# Patient Record
Sex: Female | Born: 1970
Health system: Southern US, Community
[De-identification: ages and names within clinical notes are randomized; demographics above are authoritative.]

## PROBLEM LIST (undated history)

## (undated) DIAGNOSIS — Z720 Tobacco use: Secondary | ICD-10-CM

## (undated) DIAGNOSIS — K648 Other hemorrhoids: Secondary | ICD-10-CM

## (undated) DIAGNOSIS — R42 Dizziness and giddiness: Secondary | ICD-10-CM

## (undated) DIAGNOSIS — D649 Anemia, unspecified: Secondary | ICD-10-CM

## (undated) DIAGNOSIS — F32A Depression, unspecified: Secondary | ICD-10-CM

## (undated) DIAGNOSIS — Z87898 Personal history of other specified conditions: Secondary | ICD-10-CM

## (undated) DIAGNOSIS — F429 Obsessive-compulsive disorder, unspecified: Secondary | ICD-10-CM

## (undated) DIAGNOSIS — K219 Gastro-esophageal reflux disease without esophagitis: Secondary | ICD-10-CM

## (undated) DIAGNOSIS — F419 Anxiety disorder, unspecified: Secondary | ICD-10-CM

## (undated) DIAGNOSIS — I10 Essential (primary) hypertension: Secondary | ICD-10-CM

## (undated) DIAGNOSIS — R809 Proteinuria, unspecified: Secondary | ICD-10-CM

## (undated) DIAGNOSIS — F329 Major depressive disorder, single episode, unspecified: Secondary | ICD-10-CM

## (undated) DIAGNOSIS — E669 Obesity, unspecified: Secondary | ICD-10-CM

## (undated) DIAGNOSIS — K259 Gastric ulcer, unspecified as acute or chronic, without hemorrhage or perforation: Secondary | ICD-10-CM

## (undated) DIAGNOSIS — G4733 Obstructive sleep apnea (adult) (pediatric): Secondary | ICD-10-CM

## (undated) DIAGNOSIS — M5412 Radiculopathy, cervical region: Secondary | ICD-10-CM

## (undated) DIAGNOSIS — M5090 Cervical disc disorder, unspecified, unspecified cervical region: Secondary | ICD-10-CM

## (undated) DIAGNOSIS — E559 Vitamin D deficiency, unspecified: Secondary | ICD-10-CM

## (undated) DIAGNOSIS — Z9989 Dependence on other enabling machines and devices: Secondary | ICD-10-CM

## (undated) DIAGNOSIS — G542 Cervical root disorders, not elsewhere classified: Secondary | ICD-10-CM

## (undated) DIAGNOSIS — N189 Chronic kidney disease, unspecified: Secondary | ICD-10-CM

## (undated) HISTORY — DX: Anxiety disorder, unspecified: F41.9

## (undated) HISTORY — DX: Proteinuria, unspecified: R80.9

## (undated) HISTORY — DX: Depression, unspecified: F32.A

## (undated) HISTORY — DX: Other hemorrhoids: K64.8

## (undated) HISTORY — DX: Dependence on other enabling machines and devices: Z99.89

## (undated) HISTORY — DX: Cervical disc disorder, unspecified, unspecified cervical region: M50.90

## (undated) HISTORY — DX: Gastric ulcer, unspecified as acute or chronic, without hemorrhage or perforation: K25.9

## (undated) HISTORY — DX: Obesity, unspecified: E66.9

## (undated) HISTORY — DX: Major depressive disorder, single episode, unspecified: F32.9

## (undated) HISTORY — PX: BRAIN SURGERY: SHX531

## (undated) HISTORY — PX: TUBAL LIGATION: SHX77

## (undated) HISTORY — DX: Essential (primary) hypertension: I10

## (undated) HISTORY — DX: Personal history of other specified conditions: Z87.898

## (undated) HISTORY — DX: Tobacco use: Z72.0

## (undated) HISTORY — PX: CHOLECYSTECTOMY: SHX55

## (undated) HISTORY — DX: Chronic kidney disease, unspecified: N18.9

## (undated) HISTORY — DX: Obstructive sleep apnea (adult) (pediatric): G47.33

## (undated) HISTORY — DX: Obsessive-compulsive disorder, unspecified: F42.9

## (undated) HISTORY — DX: Vitamin D deficiency, unspecified: E55.9

---

## 1998-05-15 ENCOUNTER — Other Ambulatory Visit: Admission: RE | Admit: 1998-05-15 | Discharge: 1998-05-15 | Payer: Self-pay | Admitting: Obstetrics and Gynecology

## 1998-08-19 ENCOUNTER — Inpatient Hospital Stay (HOSPITAL_COMMUNITY): Admission: AD | Admit: 1998-08-19 | Discharge: 1998-08-27 | Payer: Self-pay | Admitting: Obstetrics and Gynecology

## 1998-08-20 ENCOUNTER — Encounter: Payer: Self-pay | Admitting: Obstetrics and Gynecology

## 1998-08-21 ENCOUNTER — Encounter: Payer: Self-pay | Admitting: Obstetrics and Gynecology

## 1998-08-27 ENCOUNTER — Encounter (HOSPITAL_COMMUNITY): Admission: RE | Admit: 1998-08-27 | Discharge: 1998-11-25 | Payer: Self-pay | Admitting: Obstetrics and Gynecology

## 1998-10-12 ENCOUNTER — Other Ambulatory Visit: Admission: RE | Admit: 1998-10-12 | Discharge: 1998-10-12 | Payer: Self-pay | Admitting: Obstetrics and Gynecology

## 2000-06-28 ENCOUNTER — Inpatient Hospital Stay (HOSPITAL_COMMUNITY): Admission: AD | Admit: 2000-06-28 | Discharge: 2000-06-28 | Payer: Self-pay | Admitting: *Deleted

## 2000-06-28 ENCOUNTER — Encounter: Payer: Self-pay | Admitting: *Deleted

## 2000-09-14 ENCOUNTER — Ambulatory Visit (HOSPITAL_COMMUNITY): Admission: RE | Admit: 2000-09-14 | Discharge: 2000-09-14 | Payer: Self-pay | Admitting: *Deleted

## 2000-09-14 ENCOUNTER — Encounter: Payer: Self-pay | Admitting: *Deleted

## 2000-11-12 ENCOUNTER — Inpatient Hospital Stay (HOSPITAL_COMMUNITY): Admission: AD | Admit: 2000-11-12 | Discharge: 2000-11-17 | Payer: Self-pay | Admitting: *Deleted

## 2000-11-12 ENCOUNTER — Encounter (INDEPENDENT_AMBULATORY_CARE_PROVIDER_SITE_OTHER): Payer: Self-pay

## 2000-11-18 ENCOUNTER — Encounter: Admission: RE | Admit: 2000-11-18 | Discharge: 2000-12-18 | Payer: Self-pay | Admitting: *Deleted

## 2000-12-08 ENCOUNTER — Emergency Department (HOSPITAL_COMMUNITY): Admission: EM | Admit: 2000-12-08 | Discharge: 2000-12-08 | Payer: Self-pay | Admitting: Emergency Medicine

## 2000-12-16 ENCOUNTER — Encounter: Payer: Self-pay | Admitting: Cardiology

## 2000-12-16 ENCOUNTER — Ambulatory Visit (HOSPITAL_COMMUNITY): Admission: RE | Admit: 2000-12-16 | Discharge: 2000-12-16 | Payer: Self-pay | Admitting: Cardiology

## 2001-01-18 ENCOUNTER — Encounter: Admission: RE | Admit: 2001-01-18 | Discharge: 2001-02-17 | Payer: Self-pay | Admitting: *Deleted

## 2001-02-16 ENCOUNTER — Encounter: Payer: Self-pay | Admitting: Nephrology

## 2001-02-16 ENCOUNTER — Encounter: Admission: RE | Admit: 2001-02-16 | Discharge: 2001-02-16 | Payer: Self-pay | Admitting: Nephrology

## 2001-03-20 ENCOUNTER — Encounter: Admission: RE | Admit: 2001-03-20 | Discharge: 2001-04-19 | Payer: Self-pay | Admitting: *Deleted

## 2001-04-20 ENCOUNTER — Encounter: Admission: RE | Admit: 2001-04-20 | Discharge: 2001-05-20 | Payer: Self-pay | Admitting: *Deleted

## 2002-01-03 ENCOUNTER — Encounter: Payer: Self-pay | Admitting: *Deleted

## 2002-01-03 ENCOUNTER — Encounter: Admission: RE | Admit: 2002-01-03 | Discharge: 2002-01-03 | Payer: Self-pay | Admitting: *Deleted

## 2002-03-09 ENCOUNTER — Emergency Department (HOSPITAL_COMMUNITY): Admission: EM | Admit: 2002-03-09 | Discharge: 2002-03-09 | Payer: Self-pay

## 2002-03-11 ENCOUNTER — Encounter: Admission: RE | Admit: 2002-03-11 | Discharge: 2002-03-11 | Payer: Self-pay | Admitting: *Deleted

## 2002-03-11 ENCOUNTER — Encounter: Payer: Self-pay | Admitting: *Deleted

## 2003-11-26 LAB — HM MAMMOGRAPHY

## 2003-11-28 ENCOUNTER — Encounter: Admission: RE | Admit: 2003-11-28 | Discharge: 2003-11-28 | Payer: Self-pay | Admitting: Family Medicine

## 2003-11-30 ENCOUNTER — Encounter: Admission: RE | Admit: 2003-11-30 | Discharge: 2003-11-30 | Payer: Self-pay | Admitting: Unknown Physician Specialty

## 2003-12-01 ENCOUNTER — Encounter: Admission: RE | Admit: 2003-12-01 | Discharge: 2003-12-01 | Payer: Self-pay | Admitting: Family Medicine

## 2003-12-01 ENCOUNTER — Encounter (INDEPENDENT_AMBULATORY_CARE_PROVIDER_SITE_OTHER): Payer: Self-pay | Admitting: *Deleted

## 2004-10-22 ENCOUNTER — Emergency Department (HOSPITAL_COMMUNITY): Admission: EM | Admit: 2004-10-22 | Discharge: 2004-10-22 | Payer: Self-pay | Admitting: Emergency Medicine

## 2004-12-31 ENCOUNTER — Other Ambulatory Visit: Admission: RE | Admit: 2004-12-31 | Discharge: 2004-12-31 | Payer: Self-pay | Admitting: Obstetrics and Gynecology

## 2005-03-11 ENCOUNTER — Ambulatory Visit (HOSPITAL_COMMUNITY): Admission: RE | Admit: 2005-03-11 | Discharge: 2005-03-11 | Payer: Self-pay

## 2006-02-08 ENCOUNTER — Emergency Department (HOSPITAL_COMMUNITY): Admission: EM | Admit: 2006-02-08 | Discharge: 2006-02-08 | Payer: Self-pay | Admitting: Emergency Medicine

## 2007-10-01 ENCOUNTER — Encounter: Admission: RE | Admit: 2007-10-01 | Discharge: 2007-10-01 | Payer: Self-pay | Admitting: Obstetrics and Gynecology

## 2007-10-13 ENCOUNTER — Encounter: Admission: RE | Admit: 2007-10-13 | Discharge: 2007-10-13 | Payer: Self-pay | Admitting: Obstetrics and Gynecology

## 2007-10-13 ENCOUNTER — Encounter (INDEPENDENT_AMBULATORY_CARE_PROVIDER_SITE_OTHER): Payer: Self-pay | Admitting: Diagnostic Radiology

## 2008-03-28 ENCOUNTER — Encounter: Admission: RE | Admit: 2008-03-28 | Discharge: 2008-03-28 | Payer: Self-pay | Admitting: Obstetrics and Gynecology

## 2008-09-18 ENCOUNTER — Ambulatory Visit (HOSPITAL_COMMUNITY): Admission: RE | Admit: 2008-09-18 | Discharge: 2008-09-18 | Payer: Self-pay | Admitting: Family Medicine

## 2009-03-28 ENCOUNTER — Encounter: Admission: RE | Admit: 2009-03-28 | Discharge: 2009-03-28 | Payer: Self-pay | Admitting: Obstetrics and Gynecology

## 2010-08-18 ENCOUNTER — Encounter: Payer: Self-pay | Admitting: Obstetrics and Gynecology

## 2010-12-13 NOTE — Op Note (Signed)
[facility]  Patient:    Cheryl Scott, Cheryl Scott                        MRN: [id number] Proc. Date: 11/13/00 Adm. Date:  [id number] Attending:  Margreta Journey                           Operative Report  PREOPERATIVE DIAGNOSES:       1. Severe prenancy-induced hypertension at                                  26 weeks.                               2. Preeclampsia.  SURGEON:                      Frederico Hamman, M.D.  FIRST ASSISTANT:              Charles A. Jodi Mourning, M.D.  ANESTHESIA:                   Spinal.  DESCRIPTION OF PROCEDURE:     The patient was placed on the operating table in the supine position after a spinal was administered.  The abdomen was prepped and draped and the bladder emptied with a Foley catheter.  A midline subumbilical incision was made through the old scar and carried down to the fascia.  The fascia was cleanly incised for the length of the incision.  The rectus muscles were retracted laterally and the peritoneum incised for the length of the incision.  There was a scar in the uterus from a previous classical incision and a classical incision was made through the old scar. The membranes were ruptured.  The fluid was clear.  The patient was delivered of a female vertex weighing 701 g.  Apgars were 4 and 7.  Team was in attendance.  Cord pH was 7.31.  The placenta was posterior, removed manually and sent to pathology.  The uterine cavity was cleaned with dry laps.  The uterine incision was closed in two layers with continuous suture of #1 chromic.  Hemostasis was satisfactory.  The right tube was grasped in the mid portion with a Babcock clamp.  A 0 plain suture was placed on the mesosalpinx below the portion of the tube within the clamp.  This was tied and approximately 1 inch of tube transected.  The procedure was done in a similar fashion on the other side.  Lap and sponge counts were correct.  The abdomen was closed in  layers, the peritoneum with continuous suture of 0 chromic, the fascia with interrupted suture of 0 Dexon.  The skin was closed with skin clips.  Blood loss was 400 cc.  The patient tolerated the procedure well and was taken to the recovery room in good condition. DD:  11/13/00 TD:  11/14/00 Job: [id number]

## 2010-12-13 NOTE — H&P (Signed)
[facility]  Patient:    Cheryl, Scott                        MRN: [id number] Adm. Date:  [id number] Attending:  Margreta Journey                         History and Physical  HISTORY OF PRESENT ILLNESS:   The patient is a 40 year old gravida 5 para 1-2-2-3, [facility] July 23, with a history of PIH.  During this pregnancy she has been on Aldomet 500 mg p.o. q.12h. and she was seen on April 18 in the emergency room with a blood pressure of 180/112, 3+ protein.  The patient states that she has been spilling protein all along in Dr. Bjorn Loser office. Her hemoglobin was 11.3, platelets 135, uric acid 5.2.  No contractions.  On admission she was started on magnesium sulfate 4 g loading 2 g an hour, also given betamethasone 12.5 mg IM to be repeated in 12 hours.  She had had two previous C sections - one classical and one low transverse for previous delivery at 25 weeks when she had severe DIC.  After admission her diastolics all remained between 109 and 119 and she was started on Norvasc 10 mg p.o. Blood pressure came down to the region of 104 and then she was given Apresoline 10 mg IV and throughout the night her diastolics ranged between 93 and 107.  The repeat uric acid done April 19 was 5.2, the alk phos was up to 160, SGOT 102, SGPT 52 - these were all elevated from the time of admission on April 18.  She started complaining of mid epigastric pain and then started having some vomiting.  It was decided that the patient would be delivered by a repeat C section and also she desired sterilization.  PHYSICAL EXAMINATION:  GENERAL:                      Revealed a well-developed female in no distress.  HEENT:                        Negative.  LUNGS:                        Clear.  HEART:                        Regular rhythm, no murmurs, no gallops.  ABDOMEN:                      Uterus 24-26 week size.  On palpation there was some tenderness in the epigastric  region.  There was no hepatomegaly noted.  PELVIC:                       Cervix was long, closed.  Pelvic exam was negative. DD:  11/14/06 TD:  11/13/00 Job: [id number]

## 2010-12-13 NOTE — Discharge Summary (Signed)
[facility]  Patient:    Cheryl Scott, Cheryl Scott                        MRN: [id number] Adm. Date:  [id number] Disc. Date: [id number] Attending:  Lacretia Leigh                           Discharge Summary  SUMMARY:                      The patient is a 40 year old gravida 34, para 3 whose estimated date of confinement was July 23 with a history of pregnancy induced hypertension.  During this pregnancy the patient has been on Aldomet 500 mg p.o. q.12h.  She was seen on April 18 in the emergency room with a blood pressure of 180/112 with 3+ proteinuria.  At the time of admission this patient was seen by Dr. Ruthann Cancer who was covering for me.  She was treated with magnesium sulfate; however, due to patient not being stabilized and also because she wanted sterilization, the decision was to proceed with a cesarean section.  Patient underwent a cesarean section on November 13, 2000 from which she tolerated procedure very well without any problems.  Postoperative the patient did very well even though she had a low platelet count of 42,000 which gradually increased.  She was discharged on November 17, 2000 and was told to return to my office in one week for followup evaluation.  Patient understood the possibility of complications and how to care and should anything occur was to call me in my office. DD:  01/02/01 TD:  01/03/01 Job: [id number]

## 2011-02-10 ENCOUNTER — Encounter: Payer: Self-pay | Admitting: Family Medicine

## 2011-02-10 DIAGNOSIS — F429 Obsessive-compulsive disorder, unspecified: Secondary | ICD-10-CM | POA: Insufficient documentation

## 2011-02-10 DIAGNOSIS — R809 Proteinuria, unspecified: Secondary | ICD-10-CM | POA: Insufficient documentation

## 2011-02-10 DIAGNOSIS — I10 Essential (primary) hypertension: Secondary | ICD-10-CM | POA: Insufficient documentation

## 2011-02-10 DIAGNOSIS — F419 Anxiety disorder, unspecified: Secondary | ICD-10-CM | POA: Insufficient documentation

## 2012-03-26 ENCOUNTER — Other Ambulatory Visit: Payer: Self-pay | Admitting: Physician Assistant

## 2012-03-26 ENCOUNTER — Ambulatory Visit
Admission: RE | Admit: 2012-03-26 | Discharge: 2012-03-26 | Disposition: A | Discharge status: Home / Self Care | Payer: 59 | Source: Ambulatory Visit | Attending: Physician Assistant | Admitting: Physician Assistant

## 2012-03-26 DIAGNOSIS — M62838 Other muscle spasm: Secondary | ICD-10-CM

## 2012-03-26 DIAGNOSIS — M79602 Pain in left arm: Secondary | ICD-10-CM

## 2012-03-26 DIAGNOSIS — M542 Cervicalgia: Secondary | ICD-10-CM

## 2012-10-15 ENCOUNTER — Encounter: Payer: Self-pay | Admitting: Family Medicine

## 2012-10-15 DIAGNOSIS — Z9989 Dependence on other enabling machines and devices: Secondary | ICD-10-CM | POA: Insufficient documentation

## 2012-10-15 DIAGNOSIS — G4733 Obstructive sleep apnea (adult) (pediatric): Secondary | ICD-10-CM | POA: Insufficient documentation

## 2012-10-18 ENCOUNTER — Ambulatory Visit (INDEPENDENT_AMBULATORY_CARE_PROVIDER_SITE_OTHER): Payer: 59 | Admitting: Physician Assistant

## 2012-10-18 ENCOUNTER — Encounter: Payer: Self-pay | Admitting: Physician Assistant

## 2012-10-18 VITALS — BP 118/80 | HR 56 | Temp 98.5°F | Resp 18 | Wt 271.0 lb

## 2012-10-18 DIAGNOSIS — H8110 Benign paroxysmal vertigo, unspecified ear: Secondary | ICD-10-CM

## 2012-10-18 DIAGNOSIS — J322 Chronic ethmoidal sinusitis: Secondary | ICD-10-CM

## 2012-10-18 MED ORDER — MECLIZINE HCL 25 MG PO TABS: 25.0000 mg | ORAL_TABLET | Freq: Two times a day (BID) | ORAL | Status: DC | PRN

## 2012-10-18 MED ORDER — AZITHROMYCIN 250 MG PO TABS: ORAL_TABLET | ORAL | Status: DC

## 2012-10-18 MED ORDER — FLUCONAZOLE 150 MG PO TABS: 150.0000 mg | ORAL_TABLET | Freq: Once | ORAL | Status: DC

## 2012-10-18 MED ORDER — PREDNISONE 20 MG PO TABS: ORAL_TABLET | ORAL | Status: DC

## 2012-10-18 NOTE — Progress Notes (Signed)
Patient ID: Cheryl Scott MRN: [id number], DOB: 06-16-1971, 42 y.o. Date of Encounter: 10/18/2012, 8:57 AM    Chief Complaint:  Chief Complaint  Patient presents with  . Dizziness    5-6 days  RX was called in but still does not feel right     HPI: 42 y.o. year old female called when I was on call 10/09/12. At that time she stated she had remote h/o vertigo. Reported that when she woke that morning, had vertigo. As long as she was lying still, felt ok but when rolled to side or turned head to the side, she had spinning sensation. I called in meclizine. This worked well. Pts symptoms have gotten much better. The past 2 days only needed one meclizine each day.   She came in today because she feels like there is congestion in her head. When blows nose, gets out thick mucus. Also, feels pressure in head. When bends over, lays on one side, etc. Feels pressure, movement in head. No chest congestion, ST, F/C.      Home Meds: Current Outpatient Prescriptions on File Prior to Visit  Medication Sig Dispense Refill  . ARIPiprazole (ABILIFY) 30 MG tablet Take 50 mg by mouth daily.        . Cholecalciferol (VITAMIN D) 2000 UNITS CAPS Take by mouth daily.      . clonazePAM (KLONOPIN) 0.5 MG tablet Take 0.5 mg by mouth 2 (two) times daily as needed.        . eszopiclone (LUNESTA) 2 MG TABS Take 2 mg by mouth at bedtime. Take immediately before bedtime       . lisdexamfetamine (VYVANSE) 50 MG capsule Take 50 mg by mouth every morning.        Marland Kitchen lisinopril (PRINIVIL,ZESTRIL) 10 MG tablet Take 10 mg by mouth daily.        Marland Kitchen omeprazole (PRILOSEC) 20 MG capsule Take 20 mg by mouth daily.        . sertraline (ZOLOFT) 50 MG tablet Take 50 mg by mouth daily.        . divalproex (DEPAKOTE SPRINKLE) 125 MG capsule Take 125 mg by mouth daily.        Marland Kitchen lamoTRIgine (LAMICTAL) 100 MG tablet Take 100 mg by mouth daily.         No current facility-administered medications on file prior to visit.    Allergies:   Allergies  Allergen Reactions  . Diuretic (Buchu-Cornsilk-Ch Grass-Hydran) Anaphylaxis  . Ace Inhibitors Cough  . Codeine   . Penicillins Hives and Itching  . Seroquel (Quetiapine)     Hypotension, alt mental status      Review of Systems: Constitutional: negative for chills, fever, night sweats, weight changes, or fatigue  HEENT: negative for vision changes, hearing loss, rhinorrhea, ST. See HPI for positives. Cardiovascular: negative for chest pain or palpitations Respiratory: negative for hemoptysis, wheezing, shortness of breath, or cough Abdominal: negative for abdominal pain, nausea, vomiting, diarrhea, or constipation Dermatological: negative for rash Neurologic: negative for headache, dizziness, or syncope    Physical Exam: Blood pressure 118/80, pulse 56, temperature 98.5 F (36.9 C), temperature source Oral, resp. rate 18, weight 271 lb (122.925 kg), last menstrual period 09/27/2012., There is no height on file to calculate BMI. General:     [name] in no acute distress. HEENT: Normocephalic, atraumatic, eyes without discharge, sclera non-icteric, nares are without discharge. Bilateral auditory canals clear, TM's are without perforation, pearly grey and translucent with reflective cone of light  bilaterally. Oral cavity moist, posterior pharynx without exudate, erythema, peritonsillar abscess, or post nasal drip. Frontal and maxillary sinuses with mild tenderness with percussion.  Neck: Supple. No thyromegaly. Full ROM. No lymphadenopathy. Lungs: Clear bilaterally to auscultation without wheezes, rales, or rhonchi. Breathing is unlabored. Heart: RRR with S1 S2. No murmurs, rubs, or gallops appreciated. Msk:  Strength and tone normal for age. [place] Maneuver: When I turn head side to side, she feels nauseas. When sits up from lying, feels "like on a boat" No spinning sensation at all-this has resolved per pt.  Extremities/Skin: Warm and dry. No clubbing or cyanosis.  No edema. No rashes or suspicious lesions. Neuro: Alert and oriented X 3. Moves all extremities spontaneously. Gait is normal. CNII-XII grossly in tact. Psych:  Responds to questions appropriately with a normal affect.    ASSESSMENT AND PLAN:  42 y.o. year old female with  1. Vertigo, benign positional  - meclizine (ANTIVERT) 25 MG tablet; Take 1 tablet (25 mg total) by mouth 2 (two) times daily as needed.  Dispense: 30 tablet; Refill: 0 She wants to have some of this on hand in case has recurrent symptoms. "I don't want to ever feel like that again".  2. Ethmoid sinusitis - predniSONE (DELTASONE) 20 MG tablet; Take 3 daily for 2 days, then 2 daily for 2 days, then 1 daily for 2 days.  Dispense: 12 tablet; Refill: 0 Penicillin Allergy so no Augmentin.: - azithromycin (ZITHROMAX) 250 MG tablet; Take 2 daily on day 1 then 1 daily on days 2-5.  Dispense: 6 tablet; Refill: 0 - fluconazole (DIFLUCAN) 150 MG tablet; Take 1 tablet (150 mg total) by mouth once.  Dispense: 1 tablet; Refill: 0  F/U if symptoms do not resolve.  [address] [place], [place], [facility] 10/18/2012 8:57 AM

## 2012-11-01 ENCOUNTER — Telehealth: Payer: Self-pay | Admitting: Physician Assistant

## 2012-11-01 DIAGNOSIS — I10 Essential (primary) hypertension: Secondary | ICD-10-CM

## 2012-11-01 MED ORDER — LISINOPRIL 10 MG PO TABS: 10.0000 mg | ORAL_TABLET | Freq: Every day | ORAL | Status: DC

## 2012-11-01 NOTE — Telephone Encounter (Signed)
Medication refilled per protocol. 

## 2012-11-05 ENCOUNTER — Other Ambulatory Visit: Payer: Self-pay | Admitting: Obstetrics and Gynecology

## 2012-11-11 ENCOUNTER — Other Ambulatory Visit: Payer: Self-pay | Admitting: Obstetrics and Gynecology

## 2012-11-11 DIAGNOSIS — R928 Other abnormal and inconclusive findings on diagnostic imaging of breast: Secondary | ICD-10-CM

## 2012-11-25 ENCOUNTER — Ambulatory Visit
Admission: RE | Admit: 2012-11-25 | Discharge: 2012-11-25 | Disposition: A | Discharge status: Home / Self Care | Payer: 59 | Source: Ambulatory Visit | Attending: Obstetrics and Gynecology | Admitting: Obstetrics and Gynecology

## 2012-11-25 DIAGNOSIS — R928 Other abnormal and inconclusive findings on diagnostic imaging of breast: Secondary | ICD-10-CM

## 2012-12-14 ENCOUNTER — Ambulatory Visit (INDEPENDENT_AMBULATORY_CARE_PROVIDER_SITE_OTHER): Payer: 59 | Admitting: Urology

## 2012-12-14 ENCOUNTER — Other Ambulatory Visit: Payer: Self-pay | Admitting: Urology

## 2012-12-14 DIAGNOSIS — R3129 Other microscopic hematuria: Secondary | ICD-10-CM

## 2012-12-16 ENCOUNTER — Telehealth: Payer: Self-pay | Admitting: Family Medicine

## 2012-12-16 DIAGNOSIS — H8110 Benign paroxysmal vertigo, unspecified ear: Secondary | ICD-10-CM

## 2012-12-16 MED ORDER — MECLIZINE HCL 25 MG PO TABS: 25.0000 mg | ORAL_TABLET | Freq: Two times a day (BID) | ORAL | Status: DC | PRN

## 2012-12-16 NOTE — Telephone Encounter (Signed)
Med rf °

## 2012-12-23 ENCOUNTER — Ambulatory Visit (HOSPITAL_COMMUNITY)
Admission: RE | Admit: 2012-12-23 | Discharge: 2012-12-23 | Disposition: A | Discharge status: Home / Self Care | Payer: 59 | Source: Ambulatory Visit | Attending: Urology | Admitting: Urology

## 2012-12-23 DIAGNOSIS — R319 Hematuria, unspecified: Secondary | ICD-10-CM | POA: Insufficient documentation

## 2012-12-23 DIAGNOSIS — R3129 Other microscopic hematuria: Secondary | ICD-10-CM

## 2012-12-23 DIAGNOSIS — Q619 Cystic kidney disease, unspecified: Secondary | ICD-10-CM | POA: Insufficient documentation

## 2012-12-23 MED ORDER — IOHEXOL 300 MG/ML  SOLN
150.0000 mL | Freq: Once | INTRAMUSCULAR | Status: AC | PRN
  Administered 2012-12-23: 150 mL via INTRAVENOUS

## 2012-12-24 ENCOUNTER — Telehealth: Payer: Self-pay | Admitting: Family Medicine

## 2012-12-24 MED ORDER — OMEPRAZOLE 20 MG PO CPDR: 20.0000 mg | DELAYED_RELEASE_CAPSULE | Freq: Every day | ORAL | Status: DC

## 2012-12-24 NOTE — Telephone Encounter (Signed)
Rx Refilled  

## 2013-01-04 ENCOUNTER — Ambulatory Visit (INDEPENDENT_AMBULATORY_CARE_PROVIDER_SITE_OTHER): Payer: 59 | Admitting: Urology

## 2013-01-04 DIAGNOSIS — R3129 Other microscopic hematuria: Secondary | ICD-10-CM

## 2013-01-04 DIAGNOSIS — N135 Crossing vessel and stricture of ureter without hydronephrosis: Secondary | ICD-10-CM

## 2013-01-16 ENCOUNTER — Other Ambulatory Visit: Payer: Self-pay | Admitting: Family Medicine

## 2013-01-17 ENCOUNTER — Telehealth: Payer: Self-pay | Admitting: Physician Assistant

## 2013-01-17 MED ORDER — OMEPRAZOLE 20 MG PO CPDR: 20.0000 mg | DELAYED_RELEASE_CAPSULE | Freq: Every day | ORAL | Status: DC

## 2013-01-17 NOTE — Telephone Encounter (Signed)
?   OK to Refill  

## 2013-01-17 NOTE — Telephone Encounter (Signed)
ok 

## 2013-01-17 NOTE — Telephone Encounter (Signed)
Medication refilled per protocol. 

## 2013-01-31 ENCOUNTER — Telehealth: Payer: Self-pay | Admitting: Family Medicine

## 2013-01-31 DIAGNOSIS — I10 Essential (primary) hypertension: Secondary | ICD-10-CM

## 2013-01-31 MED ORDER — LISINOPRIL 10 MG PO TABS: 10.0000 mg | ORAL_TABLET | Freq: Every day | ORAL | Status: DC

## 2013-01-31 NOTE — Telephone Encounter (Signed)
Rx Refilled  

## 2013-02-18 ENCOUNTER — Other Ambulatory Visit: Payer: Self-pay | Admitting: Family Medicine

## 2013-03-07 ENCOUNTER — Encounter: Payer: Self-pay | Admitting: Physician Assistant

## 2013-03-07 ENCOUNTER — Ambulatory Visit (INDEPENDENT_AMBULATORY_CARE_PROVIDER_SITE_OTHER): Payer: 59 | Admitting: Physician Assistant

## 2013-03-07 VITALS — BP 126/90 | HR 60 | Temp 98.0°F | Resp 18 | Wt 270.0 lb

## 2013-03-07 DIAGNOSIS — Z1212 Encounter for screening for malignant neoplasm of rectum: Secondary | ICD-10-CM

## 2013-03-07 DIAGNOSIS — IMO0001 Reserved for inherently not codable concepts without codable children: Secondary | ICD-10-CM

## 2013-03-07 DIAGNOSIS — Z1211 Encounter for screening for malignant neoplasm of colon: Secondary | ICD-10-CM

## 2013-03-07 LAB — COMPLETE METABOLIC PANEL WITH GFR
ALT: 23 U/L (ref 0–35)
AST: 20 U/L (ref 0–37)
Albumin: 4.1 g/dL (ref 3.5–5.2)
Alkaline Phosphatase: 87 U/L (ref 39–117)
BUN: 12 mg/dL (ref 6–23)
CO2: 28 mEq/L (ref 19–32)
Calcium: 9.5 mg/dL (ref 8.4–10.5)
Chloride: 103 mEq/L (ref 96–112)
Creat: 1.24 mg/dL — ABNORMAL HIGH (ref 0.50–1.10)
GFR, Est African American: 61 mL/min
GFR, Est Non African American: 53 mL/min — ABNORMAL LOW
Glucose, Bld: 107 mg/dL — ABNORMAL HIGH (ref 70–99)
Potassium: 4.1 mEq/L (ref 3.5–5.3)
Sodium: 140 mEq/L (ref 135–145)
Total Bilirubin: 0.4 mg/dL (ref 0.3–1.2)
Total Protein: 7.1 g/dL (ref 6.0–8.3)

## 2013-03-07 LAB — RHEUMATOID FACTOR: Rhuematoid fact SerPl-aCnc: <10 IU/mL (ref <=14)

## 2013-03-07 LAB — CBC WITH DIFFERENTIAL/PLATELET
Basophils Absolute: 0 10*3/uL (ref 0.0–0.1)
Basophils Relative: 0 % (ref 0–1)
Eosinophils Absolute: 0.2 10*3/uL (ref 0.0–0.7)
Eosinophils Relative: 3 % (ref 0–5)
HCT: 35.8 % — ABNORMAL LOW (ref 36.0–46.0)
Hemoglobin: 11.7 g/dL — ABNORMAL LOW (ref 12.0–15.0)
Lymphocytes Relative: 25 % (ref 12–46)
Lymphs Abs: 1.8 10*3/uL (ref 0.7–4.0)
MCH: 27 pg (ref 26.0–34.0)
MCHC: 32.7 g/dL (ref 30.0–36.0)
MCV: 82.5 fL (ref 78.0–100.0)
Monocytes Absolute: 0.3 10*3/uL (ref 0.1–1.0)
Monocytes Relative: 4 % (ref 3–12)
Neutro Abs: 4.8 10*3/uL (ref 1.7–7.7)
Neutrophils Relative %: 68 % (ref 43–77)
Platelets: 273 10*3/uL (ref 150–400)
RBC: 4.34 MIL/uL (ref 3.87–5.11)
RDW: 14.5 % (ref 11.5–15.5)
WBC: 7 10*3/uL (ref 4.0–10.5)

## 2013-03-07 LAB — SEDIMENTATION RATE: Sed Rate: 16 mm/hr (ref 0–22)

## 2013-03-07 LAB — TSH: TSH: 2.606 u[IU]/mL (ref 0.350–4.500)

## 2013-03-07 LAB — CK: Total CK: 135 U/L (ref 7–177)

## 2013-03-07 MED ORDER — DOXYCYCLINE HYCLATE 100 MG PO TABS: 100.0000 mg | ORAL_TABLET | Freq: Two times a day (BID) | ORAL | Status: DC
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
## 2013-03-08 LAB — B. BURGDORFI ANTIBODIES BY WB
B burgdorferi IgG Abs (IB): NEGATIVE
B burgdorferi IgM Abs (IB): NEGATIVE

## 2013-03-08 LAB — ANA: Anti Nuclear Antibody(ANA): NEGATIVE

## 2013-03-09 ENCOUNTER — Telehealth: Payer: Self-pay | Admitting: Family Medicine

## 2013-03-09 LAB — ROCKY MTN SPOTTED FVR ABS PNL(IGG+IGM)
RMSF IgG: 0.19 IV
RMSF IgM: 0.94 IV — ABNORMAL HIGH

## 2013-03-09 NOTE — Telephone Encounter (Signed)
Message copied by Olena Mater on Wed Mar 09, 2013  3:43 PM ------      Message from: Dena Billet      Created: Wed Mar 09, 2013  3:34 PM       RMSF Titer is Positive for current/recent infection. Complete all of Doxycycline.      All other labs (extensive list) negative. ------

## 2013-03-10 NOTE — Telephone Encounter (Signed)
Pt did call back and given lab results.  Reinforced importance to take all antibiotics given.

## 2013-03-18 ENCOUNTER — Other Ambulatory Visit: Payer: Self-pay | Admitting: Family Medicine

## 2013-03-21 ENCOUNTER — Other Ambulatory Visit: Payer: Self-pay | Admitting: Family Medicine

## 2013-03-21 ENCOUNTER — Telehealth: Payer: Self-pay | Admitting: Family Medicine

## 2013-03-21 DIAGNOSIS — J322 Chronic ethmoidal sinusitis: Secondary | ICD-10-CM

## 2013-03-21 MED ORDER — FLUCONAZOLE 150 MG PO TABS
150.0000 mg | ORAL_TABLET | Freq: Once | ORAL | Status: DC
Start: 2013-03-21 — End: 2013-05-10

## 2013-03-21 NOTE — Telephone Encounter (Signed)
I will e-scribe.

## 2013-03-21 NOTE — Telephone Encounter (Signed)
..  Patient aware per vm 

## 2013-03-21 NOTE — Telephone Encounter (Signed)
OK to call in

## 2013-04-14 ENCOUNTER — Telehealth: Payer: Self-pay | Admitting: Family Medicine

## 2013-04-14 MED ORDER — OMEPRAZOLE 20 MG PO CPDR: 20.0000 mg | DELAYED_RELEASE_CAPSULE | Freq: Two times a day (BID) | ORAL | Status: DC

## 2013-04-14 NOTE — Telephone Encounter (Signed)
ok 

## 2013-04-14 NOTE — Telephone Encounter (Signed)
Rx Refilled  

## 2013-04-14 NOTE — Telephone Encounter (Signed)
Omeprazole DR 20 mg cap (pt would like to start taking it 2 QD instead of 1 QD according to the pharmacy)

## 2013-04-21 ENCOUNTER — Telehealth: Payer: Self-pay

## 2013-04-21 NOTE — Telephone Encounter (Signed)
LMOM for a return call.  

## 2013-04-21 NOTE — Telephone Encounter (Signed)
Pt was referred for screening colonoscopy. ( She is age 42 now) FH of colon cancer in her sister at age 23. Needs OV.

## 2013-04-22 NOTE — Telephone Encounter (Signed)
Pt is scheduled an OV with LL on 05/10/2013 at 8:30 AM.

## 2013-05-03 ENCOUNTER — Other Ambulatory Visit: Payer: Self-pay | Admitting: Family Medicine

## 2013-05-03 NOTE — Telephone Encounter (Signed)
Medication refilled per protocol. 

## 2013-05-07 ENCOUNTER — Other Ambulatory Visit: Payer: Self-pay | Admitting: Physician Assistant

## 2013-05-09 ENCOUNTER — Telehealth: Payer: Self-pay | Admitting: Physician Assistant

## 2013-05-09 MED ORDER — LISINOPRIL 10 MG PO TABS: 10.0000 mg | ORAL_TABLET | Freq: Every day | ORAL | Status: DC

## 2013-05-09 NOTE — Telephone Encounter (Signed)
Patient needs her lisinopril refilled. Went to her pharmacy to get it refilled and they told her it was denied .

## 2013-05-09 NOTE — Telephone Encounter (Signed)
Medication refilled per protocol. 

## 2013-05-09 NOTE — Telephone Encounter (Signed)
Medication refilled per protocol.  No idea why denied??

## 2013-05-10 ENCOUNTER — Encounter (INDEPENDENT_AMBULATORY_CARE_PROVIDER_SITE_OTHER): Payer: Self-pay

## 2013-05-10 ENCOUNTER — Encounter: Payer: Self-pay | Admitting: Gastroenterology

## 2013-05-10 ENCOUNTER — Ambulatory Visit (INDEPENDENT_AMBULATORY_CARE_PROVIDER_SITE_OTHER): Payer: 59 | Admitting: Gastroenterology

## 2013-05-10 ENCOUNTER — Other Ambulatory Visit: Payer: Self-pay | Admitting: Gastroenterology

## 2013-05-10 VITALS — BP 108/69 | HR 47 | Temp 97.6°F | Ht 67.0 in | Wt 267.2 lb

## 2013-05-10 DIAGNOSIS — Z79899 Other long term (current) drug therapy: Secondary | ICD-10-CM

## 2013-05-10 DIAGNOSIS — Z800 Family history of malignant neoplasm of digestive organs: Secondary | ICD-10-CM | POA: Insufficient documentation

## 2013-05-10 DIAGNOSIS — K219 Gastro-esophageal reflux disease without esophagitis: Secondary | ICD-10-CM

## 2013-05-10 DIAGNOSIS — K625 Hemorrhage of anus and rectum: Secondary | ICD-10-CM

## 2013-05-10 MED ORDER — PEG 3350-KCL-NA BICARB-NACL 420 G PO SOLR: 4000.0000 mL | ORAL | Status: DC

## 2013-05-10 NOTE — Assessment & Plan Note (Signed)
42 y/o female who presents for first ever colonoscopy. She had sister who succumbed to colon cancer, diagnosed at age 20. Patient has chronic pp loose stools. Recent brbpr. Given polypharmacy, would recommend deep sedation in the OR.  I have discussed the risks, alternatives, benefits with regards to but not limited to the risk of reaction to medication, bleeding, infection, perforation and the patient is agreeable to proceed. Written consent to be obtained.

## 2013-05-10 NOTE — Patient Instructions (Signed)
1. We have scheduled you for a colonoscopy and upper endoscopy with Dr. Oneida Alar given your family history of colon cancer and your personal history of chronic acid reflux disease.

## 2013-05-10 NOTE — Progress Notes (Signed)
cc'd to pcp 

## 2013-05-10 NOTE — Assessment & Plan Note (Signed)
Chronic GERD on PPI for 7+ years. Generally symptoms well-controlled. Recommend one time EGD to screen for complicated GERD ie Barrett's.  I have discussed the risks, alternatives, benefits with regards to but not limited to the risk of reaction to medication, bleeding, infection, perforation and the patient is agreeable to proceed. Written consent to be obtained.
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
## 2013-05-11 ENCOUNTER — Encounter (HOSPITAL_COMMUNITY): Payer: Self-pay | Admitting: Pharmacy Technician

## 2013-05-17 ENCOUNTER — Other Ambulatory Visit (INDEPENDENT_AMBULATORY_CARE_PROVIDER_SITE_OTHER): Payer: 59

## 2013-05-17 DIAGNOSIS — F429 Obsessive-compulsive disorder, unspecified: Secondary | ICD-10-CM

## 2013-05-17 DIAGNOSIS — Z23 Encounter for immunization: Secondary | ICD-10-CM

## 2013-05-17 DIAGNOSIS — I10 Essential (primary) hypertension: Secondary | ICD-10-CM

## 2013-05-17 DIAGNOSIS — Z79899 Other long term (current) drug therapy: Secondary | ICD-10-CM

## 2013-05-17 LAB — CBC WITH DIFFERENTIAL/PLATELET
Basophils Absolute: 0 10*3/uL (ref 0.0–0.1)
Basophils Relative: 0 % (ref 0–1)
Eosinophils Absolute: 0.1 10*3/uL (ref 0.0–0.7)
Eosinophils Relative: 2 % (ref 0–5)
HCT: 36.3 % (ref 36.0–46.0)
Hemoglobin: 12.1 g/dL (ref 12.0–15.0)
Lymphocytes Relative: 19 % (ref 12–46)
Lymphs Abs: 1.4 10*3/uL (ref 0.7–4.0)
MCH: 27.2 pg (ref 26.0–34.0)
MCHC: 33.3 g/dL (ref 30.0–36.0)
MCV: 81.6 fL (ref 78.0–100.0)
Monocytes Absolute: 0.3 10*3/uL (ref 0.1–1.0)
Monocytes Relative: 4 % (ref 3–12)
Neutro Abs: 5.6 10*3/uL (ref 1.7–7.7)
Neutrophils Relative %: 75 % (ref 43–77)
Platelets: 269 10*3/uL (ref 150–400)
RBC: 4.45 MIL/uL (ref 3.87–5.11)
RDW: 15.5 % (ref 11.5–15.5)
WBC: 7.4 10*3/uL (ref 4.0–10.5)

## 2013-05-17 LAB — COMPLETE METABOLIC PANEL WITH GFR
ALT: 20 U/L (ref 0–35)
AST: 17 U/L (ref 0–37)
Albumin: 4.1 g/dL (ref 3.5–5.2)
Alkaline Phosphatase: 78 U/L (ref 39–117)
BUN: 12 mg/dL (ref 6–23)
CO2: 23 mEq/L (ref 19–32)
Calcium: 9.2 mg/dL (ref 8.4–10.5)
Chloride: 102 mEq/L (ref 96–112)
Creat: 1.34 mg/dL — ABNORMAL HIGH (ref 0.50–1.10)
GFR, Est African American: 56 mL/min — ABNORMAL LOW
GFR, Est Non African American: 48 mL/min — ABNORMAL LOW
Glucose, Bld: 139 mg/dL — ABNORMAL HIGH (ref 70–99)
Potassium: 4.2 mEq/L (ref 3.5–5.3)
Sodium: 138 mEq/L (ref 135–145)
Total Bilirubin: 0.3 mg/dL (ref 0.3–1.2)
Total Protein: 7.2 g/dL (ref 6.0–8.3)

## 2013-05-17 LAB — LIPID PANEL
Cholesterol: 151 mg/dL (ref 0–200)
HDL: 45 mg/dL (ref >39)
LDL Cholesterol: 66 mg/dL (ref 0–99)
Total CHOL/HDL Ratio: 3.4 Ratio
Triglycerides: 200 mg/dL — ABNORMAL HIGH (ref <150)
VLDL: 40 mg/dL (ref 0–40)

## 2013-05-17 LAB — TSH: TSH: 3.53 u[IU]/mL (ref 0.350–4.500)

## 2013-05-17 NOTE — Progress Notes (Unsigned)
Pt having labs for Pending Colonoscopy and for med check for Mental Health Provider.  Also need routine visit here and she is aware and will make appt.

## 2013-05-18 LAB — VITAMIN D 25 HYDROXY (VIT D DEFICIENCY, FRACTURES): Vit D, 25-Hydroxy: 64 ng/mL (ref 30–89)

## 2013-05-19 ENCOUNTER — Other Ambulatory Visit: Payer: Self-pay

## 2013-05-19 ENCOUNTER — Encounter (HOSPITAL_COMMUNITY)
Admission: RE | Admit: 2013-05-19 | Discharge: 2013-05-19 | Disposition: A | Discharge status: Home / Self Care | Payer: 59 | Source: Ambulatory Visit | Attending: Gastroenterology | Admitting: Gastroenterology

## 2013-05-19 ENCOUNTER — Encounter (HOSPITAL_COMMUNITY): Payer: Self-pay

## 2013-05-19 DIAGNOSIS — Z01818 Encounter for other preprocedural examination: Secondary | ICD-10-CM | POA: Insufficient documentation

## 2013-05-19 HISTORY — DX: Dizziness and giddiness: R42

## 2013-05-19 HISTORY — DX: Gastro-esophageal reflux disease without esophagitis: K21.9

## 2013-05-19 NOTE — Patient Instructions (Signed)
Cheryl Scott  05/19/2013   Your procedure is scheduled on:  05/24/2013 Report to [facility] at  61 AM.  Call this number if you have problems the morning of surgery: [phone]   Remember:   Do not eat food or drink liquids after midnight.   Take these medicines the morning of surgery with A SIP OF WATER: abilify, klonopin, lisinopril, antivert, prilosec, zoloft   Do not wear jewelry, make-up or nail polish.  Do not wear lotions, powders, or perfumes.   Do not shave 48 hours prior to surgery. Men may shave face and neck.  Do not bring valuables to the hospital.  [facility] is not responsible  for any belongings or valuables.               Contacts, dentures or bridgework may not be worn into surgery.  Leave suitcase in the car. After surgery it may be brought to your room.  For patients admitted to the hospital, discharge time is determined by your treatment team.               Patients discharged the day of surgery will not be allowed to drive home.  Name and phone number of your driver: family  Special Instructions: N/A   Please read over the following fact sheets that you were given: Pain Booklet, Coughing and Deep Breathing, Surgical Site Infection Prevention, Anesthesia Post-op Instructions and Care and Recovery After Surgery Colonoscopy A colonoscopy is an exam to evaluate your entire colon. In this exam, your colon is cleansed. A long fiberoptic tube is inserted through your rectum and into your colon. The fiberoptic scope (endoscope) is a long bundle of enclosed and very flexible fibers. These fibers transmit light to the area examined and send images from that area to your caregiver. Discomfort is usually minimal. You may be given a drug to help you sleep (sedative) during or prior to the procedure. This exam helps to detect lumps (tumors), polyps, inflammation, and areas of bleeding. Your caregiver may also take a small piece of tissue (biopsy) that will be  examined under a microscope. LET YOUR CAREGIVER KNOW ABOUT:   Allergies to food or medicine.  Medicines taken, including vitamins, herbs, eyedrops, over-the-counter medicines, and creams.  Use of steroids (by mouth or creams).  Previous problems with anesthetics or numbing medicines.  History of bleeding problems or blood clots.  Previous surgery.  Other health problems, including diabetes and kidney problems.  Possibility of pregnancy, if this applies. BEFORE THE PROCEDURE   A clear liquid diet may be required for 2 days before the exam.  Ask your caregiver about changing or stopping your regular medications.  Liquid injections (enemas) or laxatives may be required.  A large amount of electrolyte solution may be given to you to drink over a short period of time. This solution is used to clean out your colon.  You should be present 60 minutes prior to your procedure or as directed by your caregiver. AFTER THE PROCEDURE   If you received a sedative or pain relieving medication, you will need to arrange for someone to drive you home.  Occasionally, there is a little blood passed with the first bowel movement. Do not be concerned. FINDING OUT THE RESULTS OF YOUR TEST Not all test results are available during your visit. If your test results are not back during the visit, make an appointment with your caregiver to find out the results. Do  not assume everything is normal if you have not heard from your caregiver or the medical facility. It is important for you to follow up on all of your test results. HOME CARE INSTRUCTIONS   It is not unusual to pass moderate amounts of gas and experience mild abdominal cramping following the procedure. This is due to air being used to inflate your colon during the exam. Walking or a warm pack on your belly (abdomen) may help.  You may resume all normal meals and activities after sedatives and medicines have worn off.  Only take over-the-counter  or prescription medicines for pain, discomfort, or fever as directed by your caregiver. Do not use aspirin or blood thinners if a biopsy was taken. Consult your caregiver for medicine usage if biopsies were taken. SEEK IMMEDIATE MEDICAL CARE IF:   You have a fever.  You pass large blood clots or fill a toilet with blood following the procedure. This may also occur 10 to 14 days following the procedure. This is more likely if a biopsy was taken.  You develop abdominal pain that keeps getting worse and cannot be relieved with medicine. Document Released: 07/11/2000 Document Revised: 10/06/2011 Document Reviewed: 02/24/2008 [facility] Patient Information 2014 [place]. Esophagogastroduodenoscopy Esophagogastroduodenoscopy (EGD) is a procedure to examine the lining of the esophagus, stomach, and first part of the small intestine (duodenum). A long, flexible, lighted tube with a camera attached (endoscope) is inserted down the throat to view these organs. This procedure is done to detect problems or abnormalities, such as inflammation, bleeding, ulcers, or growths, in order to treat them. The procedure lasts about 5 20 minutes. It is usually an outpatient procedure, but it may need to be performed in emergency cases in the hospital. LET YOUR CAREGIVER KNOW ABOUT:   Allergies to food or medicine.  All medicines you are taking, including vitamins, herbs, eyedrops, and over-the-counter medicines and creams.  Use of steroids (by mouth or creams).  Previous problems you or members of your family have had with the use of anesthetics.  Any blood disorders you have.  Previous surgeries you have had.  Other health problems you have.  Possibility of pregnancy, if this applies. RISKS AND COMPLICATIONS  Generally, EGD is a safe procedure. However, as with any procedure, complications can occur. Possible complications include:  Infection.  Bleeding.  Tearing (perforation) of the esophagus,  stomach, or duodenum.  Difficulty breathing or not being able to breath.  Excessive sweating.  Spasms of the larynx.  Slowed heartbeat.  Low blood pressure. BEFORE THE PROCEDURE  Do not eat or drink anything for 6 8 hours before the procedure or as directed by your caregiver.  Ask your caregiver about changing or stopping your regular medicines.  If you wear dentures, be prepared to remove them before the procedure.  Arrange for someone to drive you home after the procedure. PROCEDURE   A vein will be accessed to give medicines and fluids. A medicine to relax you (sedative) and a pain reliever will be given through that access into the vein.  A numbing medicine (local anesthetic) may be sprayed on your throat for comfort and to stop you from gagging or coughing.  A mouth guard may be placed in your mouth to protect your teeth and to keep you from biting on the endoscope.  You will be asked to lie on your left side.  The endoscope is inserted down your throat and into the esophagus, stomach, and duodenum.  Air is put through  the endoscope to allow your caregiver to view the lining of your esophagus clearly.  The esophagus, stomach, and duodenum is then examined. During the exam, your caregiver may:  Remove tissue to be examined under a microscope (biopsy) for inflammation, infection, or other medical problems.  Remove growths.  Remove objects (foreign bodies) that are stuck.  Treat any bleeding with medicines or other devices that stop tissues from bleeding (hot cauters, clipping devices).  Widen (dilate) or stretch narrowed areas of the esophagus and stomach.  The endoscope will then be withdrawn. AFTER THE PROCEDURE  You will be taken to a recovery area to be monitored. You will be able to go home once you are stable and alert.  Do not eat or drink anything until the local anesthetic and numbing medicines have worn off. You may choke.  It is normal to feel  bloated, have pain with swallowing, or have a sore throat for a short time. This will wear off.  Your caregiver should be able to discuss his or her findings with you. It will take longer to discuss the test results if any biopsies were taken. Document Released: 11/14/2004 Document Revised: 06/30/2012 Document Reviewed: 06/16/2012 [facility] Patient Information 2014 [place], [place]. PATIENT INSTRUCTIONS POST-ANESTHESIA  IMMEDIATELY FOLLOWING SURGERY:  Do not drive or operate machinery for the first twenty four hours after surgery.  Do not make any important decisions for twenty four hours after surgery or while taking narcotic pain medications or sedatives.  If you develop intractable nausea and vomiting or a severe headache please notify your doctor immediately.  FOLLOW-UP:  Please make an appointment with your surgeon as instructed. You do not need to follow up with anesthesia unless specifically instructed to do so.  WOUND CARE INSTRUCTIONS (if applicable):  Keep a dry clean dressing on the anesthesia/puncture wound site if there is drainage.  Once the wound has quit draining you may leave it open to air.  Generally you should leave the bandage intact for twenty four hours unless there is drainage.  If the epidural site drains for more than 36-48 hours please call the anesthesia department.  QUESTIONS?:  Please feel free to call your physician or the hospital operator if you have any questions, and they will be happy to assist you.

## 2013-05-24 ENCOUNTER — Ambulatory Visit (HOSPITAL_COMMUNITY)
Admission: RE | Admit: 2013-05-24 | Discharge: 2013-05-24 | Disposition: A | Discharge status: Home / Self Care | Payer: 59 | Source: Ambulatory Visit | Attending: Gastroenterology | Admitting: Gastroenterology

## 2013-05-24 ENCOUNTER — Encounter (HOSPITAL_COMMUNITY): Payer: Self-pay | Admitting: Anesthesiology

## 2013-05-24 ENCOUNTER — Telehealth: Payer: Self-pay

## 2013-05-24 ENCOUNTER — Encounter (HOSPITAL_COMMUNITY)
Admission: RE | Disposition: A | Discharge status: Home / Self Care | Payer: Self-pay | Source: Ambulatory Visit | Attending: Gastroenterology

## 2013-05-24 ENCOUNTER — Encounter (HOSPITAL_COMMUNITY): Payer: 59 | Admitting: Anesthesiology

## 2013-05-24 ENCOUNTER — Ambulatory Visit (HOSPITAL_COMMUNITY): Payer: 59 | Admitting: Anesthesiology

## 2013-05-24 DIAGNOSIS — Z800 Family history of malignant neoplasm of digestive organs: Secondary | ICD-10-CM | POA: Insufficient documentation

## 2013-05-24 DIAGNOSIS — Z6841 Body Mass Index (BMI) 40.0 and over, adult: Secondary | ICD-10-CM | POA: Insufficient documentation

## 2013-05-24 DIAGNOSIS — K2940 Chronic atrophic gastritis without bleeding: Secondary | ICD-10-CM | POA: Insufficient documentation

## 2013-05-24 DIAGNOSIS — K3189 Other diseases of stomach and duodenum: Secondary | ICD-10-CM | POA: Insufficient documentation

## 2013-05-24 DIAGNOSIS — K648 Other hemorrhoids: Secondary | ICD-10-CM | POA: Insufficient documentation

## 2013-05-24 DIAGNOSIS — Z1211 Encounter for screening for malignant neoplasm of colon: Secondary | ICD-10-CM | POA: Insufficient documentation

## 2013-05-24 DIAGNOSIS — K222 Esophageal obstruction: Secondary | ICD-10-CM

## 2013-05-24 DIAGNOSIS — K259 Gastric ulcer, unspecified as acute or chronic, without hemorrhage or perforation: Secondary | ICD-10-CM

## 2013-05-24 DIAGNOSIS — I10 Essential (primary) hypertension: Secondary | ICD-10-CM | POA: Insufficient documentation

## 2013-05-24 DIAGNOSIS — K2980 Duodenitis without bleeding: Secondary | ICD-10-CM

## 2013-05-24 DIAGNOSIS — R1013 Epigastric pain: Secondary | ICD-10-CM

## 2013-05-24 HISTORY — PX: ESOPHAGOGASTRODUODENOSCOPY (EGD) WITH PROPOFOL: SHX5813

## 2013-05-24 HISTORY — PX: BIOPSY: SHX5522

## 2013-05-24 HISTORY — PX: COLONOSCOPY WITH PROPOFOL: SHX5780

## 2013-05-24 LAB — HM COLONOSCOPY

## 2013-05-24 SURGERY — COLONOSCOPY WITH PROPOFOL
Anesthesia: Monitor Anesthesia Care

## 2013-05-24 MED ORDER — LACTATED RINGERS IV SOLN
INTRAVENOUS | Status: DC | PRN
  Administered 2013-05-24 (×2): via INTRAVENOUS

## 2013-05-24 MED ORDER — STERILE WATER FOR IRRIGATION IR SOLN
Status: DC | PRN
  Administered 2013-05-24: 08:00:00

## 2013-05-24 MED ORDER — FENTANYL CITRATE 0.05 MG/ML IJ SOLN: 25.0000 ug | INTRAMUSCULAR | Status: DC | PRN

## 2013-05-24 MED ORDER — ONDANSETRON HCL 4 MG/2ML IJ SOLN
INTRAMUSCULAR | Status: AC
  Filled 2013-05-24: qty 2

## 2013-05-24 MED ORDER — GLYCOPYRROLATE 0.2 MG/ML IJ SOLN
INTRAMUSCULAR | Status: AC
  Filled 2013-05-24: qty 1

## 2013-05-24 MED ORDER — ONDANSETRON HCL 4 MG/2ML IJ SOLN: 4.0000 mg | Freq: Once | INTRAMUSCULAR | Status: DC | PRN

## 2013-05-24 MED ORDER — WATER FOR IRRIGATION, STERILE IR SOLN
Status: DC | PRN
  Administered 2013-05-24: 1000 mL

## 2013-05-24 MED ORDER — GLYCOPYRROLATE 0.2 MG/ML IJ SOLN
0.2000 mg | Freq: Once | INTRAMUSCULAR | Status: AC
  Administered 2013-05-24: 0.2 mg via INTRAVENOUS

## 2013-05-24 MED ORDER — ONDANSETRON HCL 4 MG/2ML IJ SOLN
4.0000 mg | Freq: Once | INTRAMUSCULAR | Status: AC
  Administered 2013-05-24: 4 mg via INTRAVENOUS

## 2013-05-24 MED ORDER — FENTANYL CITRATE 0.05 MG/ML IJ SOLN
INTRAMUSCULAR | Status: AC
  Filled 2013-05-24: qty 2

## 2013-05-24 MED ORDER — FENTANYL CITRATE 0.05 MG/ML IJ SOLN
25.0000 ug | INTRAMUSCULAR | Status: AC
Start: 2013-05-24 — End: 2013-05-24
  Administered 2013-05-24 (×2): 25 ug via INTRAVENOUS

## 2013-05-24 MED ORDER — MIDAZOLAM HCL 2 MG/2ML IJ SOLN
INTRAMUSCULAR | Status: AC
  Filled 2013-05-24: qty 2

## 2013-05-24 MED ORDER — PROPOFOL INFUSION 10 MG/ML OPTIME
INTRAVENOUS | Status: DC | PRN
  Administered 2013-05-24: 08:00:00 via INTRAVENOUS
  Administered 2013-05-24: 75 ug/kg/min via INTRAVENOUS

## 2013-05-24 MED ORDER — LIDOCAINE HCL (PF) 1 % IJ SOLN
INTRAMUSCULAR | Status: AC
  Filled 2013-05-24: qty 5

## 2013-05-24 MED ORDER — PROPOFOL 10 MG/ML IV BOLUS
INTRAVENOUS | Status: AC
  Filled 2013-05-24: qty 20

## 2013-05-24 MED ORDER — FENTANYL CITRATE 0.05 MG/ML IJ SOLN
INTRAMUSCULAR | Status: DC | PRN
  Administered 2013-05-24 (×2): 25 ug via INTRAVENOUS

## 2013-05-24 MED ORDER — LIDOCAINE HCL (CARDIAC) 10 MG/ML IV SOLN
INTRAVENOUS | Status: DC | PRN
  Administered 2013-05-24: 50 mg via INTRAVENOUS

## 2013-05-24 MED ORDER — MIDAZOLAM HCL 5 MG/5ML IJ SOLN
INTRAMUSCULAR | Status: DC | PRN
  Administered 2013-05-24 (×2): 1 mg via INTRAVENOUS

## 2013-05-24 MED ORDER — MIDAZOLAM HCL 2 MG/2ML IJ SOLN
1.0000 mg | INTRAMUSCULAR | Status: DC | PRN
  Administered 2013-05-24: 2 mg via INTRAVENOUS

## 2013-05-24 MED ORDER — LACTATED RINGERS IV SOLN: INTRAVENOUS | Status: DC

## 2013-05-24 SURGICAL SUPPLY — 24 items
BLOCK BITE 60FR ADLT L/F BLUE (MISCELLANEOUS) ×1 IMPLANT
ELECT REM PT RETURN 9FT ADLT (ELECTROSURGICAL)
ELECTRODE REM PT RTRN 9FT ADLT (ELECTROSURGICAL) IMPLANT
FCP BXJMBJMB 240X2.8X (CUTTING FORCEPS)
FLOOR PAD 36X40 (MISCELLANEOUS) ×2
FORCEPS BIOP RAD 4 LRG CAP 4 (CUTTING FORCEPS) IMPLANT
FORCEPS BIOP RJ4 240 W/NDL (CUTTING FORCEPS)
FORCEPS BXJMBJMB 240X2.8X (CUTTING FORCEPS) IMPLANT
FORMALIN 10 PREFIL 20ML (MISCELLANEOUS) ×1 IMPLANT
INJECTOR/SNARE I SNARE (MISCELLANEOUS) IMPLANT
LUBRICANT JELLY 4.5OZ STERILE (MISCELLANEOUS) ×1 IMPLANT
MANIFOLD NEPTUNE II (INSTRUMENTS) ×2 IMPLANT
NDL SCLEROTHERAPY 25GX240 (NEEDLE) IMPLANT
NEEDLE SCLEROTHERAPY 25GX240 (NEEDLE) ×2 IMPLANT
PAD FLOOR 36X40 (MISCELLANEOUS) ×1 IMPLANT
PROBE APC STR FIRE (PROBE) IMPLANT
PROBE INJECTION GOLD (MISCELLANEOUS)
PROBE INJECTION GOLD 7FR (MISCELLANEOUS) IMPLANT
SNARE SHORT THROW 13M SML OVAL (MISCELLANEOUS) ×2 IMPLANT
SYR 50ML LL SCALE MARK (SYRINGE) ×1 IMPLANT
TRAP SPECIMEN MUCOUS 40CC (MISCELLANEOUS) ×1 IMPLANT
TUBING ENDO SMARTCAP PENTAX (MISCELLANEOUS) ×1 IMPLANT
TUBING IRRIGATION ENDOGATOR (MISCELLANEOUS) ×1 IMPLANT
WATER STERILE IRR 1000ML POUR (IV SOLUTION) ×1 IMPLANT

## 2013-05-24 NOTE — Transfer of Care (Signed)
Immediate Anesthesia Transfer of Care Note  Patient: Cheryl Scott  Procedure(s) Performed: Procedure(s) with comments: COLONOSCOPY WITH PROPOFOL (N/A) - in cecum at 0809 out at 0818 = 9 minutes total ESOPHAGOGASTRODUODENOSCOPY (EGD) WITH PROPOFOL (N/A) BIOPSY (N/A)  Patient Location: PACU  Anesthesia Type:MAC  Level of Consciousness: sedated and patient cooperative  Airway & Oxygen Therapy: Patient Spontanous Breathing  Post-op Assessment: Report given to PACU RN and Post -op Vital signs reviewed and stable  Post vital signs: Reviewed  Complications: No apparent anesthesia complications

## 2013-05-24 NOTE — Preoperative (Signed)
Beta Blockers   Reason not to administer Beta Blockers:Not Applicable 

## 2013-05-24 NOTE — Progress Notes (Signed)
Awake. Wants to go home. Swallowing without difficulty. Wants to see husband. Encouraged to pass air. Voiced understanding.

## 2013-05-24 NOTE — Anesthesia Postprocedure Evaluation (Signed)
  Anesthesia Post-op Note  Patient: Cheryl Scott  Procedure(s) Performed: Procedure(s) with comments: COLONOSCOPY WITH PROPOFOL (N/A) - in cecum at 0809 out at 0818 = 9 minutes total ESOPHAGOGASTRODUODENOSCOPY (EGD) WITH PROPOFOL (N/A) BIOPSY (N/A)  Patient Location: PACU  Anesthesia Type:MAC  Level of Consciousness: sedated and patient cooperative  Airway and Oxygen Therapy: Patient Spontanous Breathing  Post-op Pain: none  Post-op Assessment: Post-op Vital signs reviewed, Patient's Cardiovascular Status Stable, Respiratory Function Stable, Patent Airway, No signs of Nausea or vomiting and Pain level controlled  Post-op Vital Signs: Reviewed and stable  Complications: No apparent anesthesia complications

## 2013-05-24 NOTE — Telephone Encounter (Signed)
Olivia Mackie from Day Surgery called and said pt needs e30 appt with Dr. Oneida Alar to follow up TCS/EGD in 3 months. She is aware I do not have that schedule now and forwarding to [facility].

## 2013-05-24 NOTE — Anesthesia Preprocedure Evaluation (Addendum)
Anesthesia Evaluation  Patient identified by MRN, date of birth, ID band Patient awake    Reviewed: Allergy & Precautions, H&P , NPO status , Patient's Chart, lab work & pertinent test results  Airway Mallampati: II TM Distance: >3 FB Neck ROM: Full    Dental  (+) Edentulous Upper   Pulmonary sleep apnea and Continuous Positive Airway Pressure Ventilation ,  breath sounds clear to auscultation        Cardiovascular hypertension, Pt. on medications Rhythm:Regular Rate:Normal     Neuro/Psych PSYCHIATRIC DISORDERS Anxiety Depression    GI/Hepatic   Endo/Other    Renal/GU      Musculoskeletal   Abdominal   Peds  Hematology   Anesthesia Other Findings   Reproductive/Obstetrics                           Anesthesia Physical Anesthesia Plan  ASA: III  Anesthesia Plan: MAC   Post-op Pain Management:    Induction: Intravenous  Airway Management Planned: Simple Face Mask  Additional Equipment:   Intra-op Plan:   Post-operative Plan:   Informed Consent: I have reviewed the patients History and Physical, chart, labs and discussed the procedure including the risks, benefits and alternatives for the proposed anesthesia with the patient or authorized representative who has indicated his/her understanding and acceptance.     Plan Discussed with:   Anesthesia Plan Comments:         Anesthesia Quick Evaluation

## 2013-05-24 NOTE — Progress Notes (Signed)
Arrived to preop. C/O stuffy and running nose. C/O clear and yellow nasal drainage. Frequent non-productive cough. Denies body aches and fever.

## 2013-05-24 NOTE — Discharge Instructions (Addendum)
YOUR ABDOMINAL PAIN IS DUE TO EROSIVE GASTRITIS & ulcers. YYou have internal hemorrhoids & DIVERTICULOSIS IN YOUR LEFT COLON.  I BIOPSIED YOUR STOMACH.     CONTINUE YOUR WEIGHT LOSS EFFORTS.  CONTINUE OMEPRAZOLE TWICE DAILY.  AVOID ITEMS THAT TRIGGER GASTRITIS. SEE INFO BELOW  FOLLOW A LOW FAT/HIGH FIBER DIET. AVOID ITEMS THAT CAUSE BLOATING. SEE INFO BELOW.  YOUR BIOPSY WILL BE BACK IN 7 DAYS.  FOLLOW UP IN 3 MOS.   ENDOSCOPY Care After Read the instructions outlined below and refer to this sheet in the next week. These discharge instructions provide you with general information on caring for yourself after you leave the hospital. While your treatment has been planned according to the most current medical practices available, unavoidable complications occasionally occur. If you have any problems or questions after discharge, call DR. Quynn Vilchis, [phone].  ACTIVITY  You may resume your regular activity, but move at a slower pace for the next 24 hours.   Take frequent rest periods for the next 24 hours.   Walking will help get rid of the air and reduce the bloated feeling in your belly (abdomen).   No driving for 24 hours (because of the medicine (anesthesia) used during the test).   You may shower.   Do not sign any important legal documents or operate any machinery for 24 hours (because of the anesthesia used during the test).    NUTRITION  Drink plenty of fluids.   You may resume your normal diet as instructed by your doctor.   Begin with a light meal and progress to your normal diet. Heavy or fried foods are harder to digest and may make you feel sick to your stomach (nauseated).   Avoid alcoholic beverages for 24 hours or as instructed.    MEDICATIONS  You may resume your normal medications.   WHAT YOU CAN EXPECT TODAY  Some feelings of bloating in the abdomen.   Passage of more gas than usual.   Spotting of blood in your stool or on the toilet  paper  .  IF YOU HAD POLYPS REMOVED DURING THE ENDOSCOPY:  Eat a soft diet IF YOU HAVE NAUSEA, BLOATING, ABDOMINAL PAIN, OR VOMITING.    FINDING OUT THE RESULTS OF YOUR TEST Not all test results are available during your visit. DR. Oneida Alar WILL CALL YOU WITHIN 7 DAYS OF YOUR PROCEDUE WITH YOUR RESULTS. Do not assume everything is normal if you have not heard from DR. Sherman Donaldson IN ONE WEEK, CALL HER OFFICE AT [phone].  SEEK IMMEDIATE MEDICAL ATTENTION AND CALL THE OFFICE: [phone] IF:  You have more than a spotting of blood in your stool.   Your belly is swollen (abdominal distention).   You are nauseated or vomiting.   You have a temperature over 101F.   You have abdominal pain or discomfort that is severe or gets worse throughout the day.   Gastritis  Gastritis is an inflammation (the body's way of reacting to injury and/or infection) of the stomach. It is often caused by bacterial (germ) infections. It can also be caused BY ASPIRIN, BC/GOODY POWDER'S, (IBUPROFEN) MOTRIN, OR ALEVE (NAPROXEN), chemicals (including alcohol), SPICY FOODS, and medications. This illness may be associated with generalized malaise (feeling tired, not well), UPPER ABDOMINAL STOMACH cramps, and fever. One common bacterial cause of gastritis is an organism known as H. Pylori. This can be treated with antibiotics.    High-Fiber Diet A high-fiber diet changes your normal diet to include more whole grains, legumes, fruits,  and vegetables. Changes in the diet involve replacing refined carbohydrates with unrefined foods. The calorie level of the diet is essentially unchanged. The Dietary Reference Intake (recommended amount) for adult males is 38 grams per day. For adult females, it is 25 grams per day. Pregnant and lactating women should consume 28 grams of fiber per day. Fiber is the intact part of a plant that is not broken down during digestion. Functional fiber is fiber that has been isolated from the  plant to provide a beneficial effect in the body. PURPOSE  Increase stool bulk.   Ease and regulate bowel movements.   Lower cholesterol.  INDICATIONS THAT YOU NEED MORE FIBER  Constipation and hemorrhoids.   Uncomplicated diverticulosis (intestine condition) and irritable bowel syndrome.   Weight management.   As a protective measure against hardening of the arteries (atherosclerosis), diabetes, and cancer.   DO NOT USE WITH:  Acute diverticulitis (intestine infection).   Partial small bowel obstructions.   Complicated diverticular disease involving bleeding, rupture (perforation), or abscess (boil, furuncle).   Presence of autonomic neuropathy (nerve damage) or gastroparesis (stomach cannot empty itself).    GUIDELINES FOR INCREASING FIBER IN THE DIET  Start adding fiber to the diet slowly. A gradual increase of about 5 more grams (2 slices of whole-wheat bread, 2 servings of most fruits or vegetables, or 1 bowl of high-fiber cereal) per day is best. Too rapid an increase in fiber may result in constipation, flatulence, and bloating.   Drink enough water and fluids to keep your urine clear or pale yellow. Water, juice, or caffeine-free drinks are recommended. Not drinking enough fluid may cause constipation.   Eat a variety of high-fiber foods rather than one type of fiber.   Try to increase your intake of fiber through using high-fiber foods rather than fiber pills or supplements that contain small amounts of fiber.   The goal is to change the types of food eaten. Do not supplement your present diet with high-fiber foods, but replace foods in your present diet.    INCLUDE A VARIETY OF FIBER SOURCES  Replace refined and processed grains with whole grains, canned fruits with fresh fruits, and incorporate other fiber sources. White rice, white breads, and most bakery goods contain little or no fiber.   Brown whole-grain rice, buckwheat oats, and many fruits and  vegetables are all good sources of fiber. These include: broccoli, Brussels sprouts, cabbage, cauliflower, beets, sweet potatoes, white potatoes (skin on), carrots, tomatoes, eggplant, squash, berries, fresh fruits, and dried fruits.   Cereals appear to be the richest source of fiber. Cereal fiber is found in whole grains and bran. Bran is the fiber-rich outer coat of cereal grain, which is largely removed in refining. In whole-grain cereals, the bran remains. In breakfast cereals, the largest amount of fiber is found in those with "bran" in their names. The fiber content is sometimes indicated on the label.   You may need to include additional fruits and vegetables each day.   In baking, for 1 cup white flour, you may use the following substitutions:   1 cup whole-wheat flour minus 2 tablespoons.   1/2 cup white flour plus 1/2 cup whole-wheat flour.   Low-Fat Diet BREADS, CEREALS, PASTA, RICE, DRIED PEAS, AND BEANS These products are high in carbohydrates and most are low in fat. Therefore, they can be increased in the diet as substitutes for fatty foods. They too, however, contain calories and should not be eaten in excess. Cereals can be  eaten for snacks as well as for breakfast.  Include foods that contain fiber (fruits, vegetables, whole grains, and legumes). Research shows that fiber may lower blood cholesterol levels, especially the water-soluble fiber found in fruits, vegetables, oat products, and legumes. FRUITS AND VEGETABLES It is good to eat fruits and vegetables. Besides being sources of fiber, both are rich in vitamins and some minerals. They help you get the daily allowances of these nutrients. Fruits and vegetables can be used for snacks and desserts. MEATS Limit lean meat, chicken, [place], and fish to no more than 6 ounces per day. Beef, Pork, and Lamb Use lean cuts of beef, pork, and lamb. Lean cuts include:  Extra-lean ground beef.  Arm roast.  Sirloin tip.  Center-cut  ham.  Round steak.  Loin chops.  Rump roast.  Tenderloin.  Trim all fat off the outside of meats before cooking. It is not necessary to severely decrease the intake of red meat, but lean choices should be made. Lean meat is rich in protein and contains a highly absorbable form of iron. Premenopausal women, in particular, should avoid reducing lean red meat because this could increase the risk for low red blood cells (iron-deficiency anemia). The organ meats, such as liver, sweetbreads, kidneys, and brain are very rich in cholesterol. They should be limited. Chicken and [place] These are good sources of protein. The fat of poultry can be reduced by removing the skin and underlying fat layers before cooking. Chicken and [place] can be substituted for lean red meat in the diet. Poultry should not be fried or covered with high-fat sauces. Fish and Shellfish Fish is a good source of protein. Shellfish contain cholesterol, but they usually are low in saturated fatty acids. The preparation of fish is important. Like chicken and [place], they should not be fried or covered with high-fat sauces. EGGS Egg whites contain no fat or cholesterol. They can be eaten often. Try 1 to 2 egg whites instead of whole eggs in recipes or use egg substitutes that do not contain yolk. MILK AND DAIRY PRODUCTS Use skim or 1% milk instead of 2% or whole milk. Decrease whole milk, natural, and processed cheeses. Use nonfat or low-fat (2%) cottage cheese or low-fat cheeses made from vegetable oils. Choose nonfat or low-fat (1 to 2%) yogurt. Experiment with evaporated skim milk in recipes that call for heavy cream. Substitute low-fat yogurt or low-fat cottage cheese for sour cream in dips and salad dressings. Have at least 2 servings of low-fat dairy products, such as 2 glasses of skim (or 1%) milk each day to help get your daily calcium intake.  FATS AND OILS Reduce the total intake of fats, especially saturated fat. Butterfat,  lard, and beef fats are high in saturated fat and cholesterol. These should be avoided as much as possible. Vegetable fats do not contain cholesterol, but certain vegetable fats, such as coconut oil, palm oil, and palm kernel oil are very high in saturated fats. These should be limited. These fats are often used in bakery goods, processed foods, popcorn, oils, and nondairy creamers. Vegetable shortenings and some peanut butters contain hydrogenated oils, which are also saturated fats. Read the labels on these foods and check for saturated vegetable oils. Unsaturated vegetable oils and fats do not raise blood cholesterol. However, they should be limited because they are fats and are high in calories. Total fat should still be limited to 30% of your daily caloric intake. Desirable liquid vegetable oils are corn oil, cottonseed oil,  olive oil, canola oil, safflower oil, soybean oil, and sunflower oil. Peanut oil is not as good, but small amounts are acceptable. Buy a heart-healthy tub margarine that has no partially hydrogenated oils in the ingredients. Mayonnaise and salad dressings often are made from unsaturated fats, but they should also be limited because of their high calorie and fat content. Seeds, nuts, peanut butter, olives, and avocados are high in fat, but the fat is mainly the unsaturated type. These foods should be limited mainly to avoid excess calories and fat. OTHER EATING TIPS Snacks  Most sweets should be limited as snacks. They tend to be rich in calories and fats, and their caloric content outweighs their nutritional value. Some good choices in snacks are graham crackers, melba toast, soda crackers, bagels (no egg), English muffins, fruits, and vegetables. These snacks are preferable to snack crackers, [place] fries, and chips. Popcorn should be air-popped or cooked in small amounts of liquid vegetable oil. Desserts Eat fruit, low-fat yogurt, and fruit ices. AVOID pastries, cake, and cookies.  Sherbet, angel food cake, gelatin dessert, frozen low-fat yogurt, or other frozen products that do not contain saturated fat (pure fruit juice bars, frozen ice pops) are also acceptable.  COOKING METHODS Choose those methods that use little or no fat. They include: Poaching.  Braising.  Steaming.  Grilling.  Baking.  Stir-frying.  Broiling.  Microwaving.  Foods can be cooked in a nonstick pan without added fat, or use a nonfat cooking spray in regular cookware. Limit fried foods and avoid frying in saturated fat. Add moisture to lean meats by using water, broth, cooking wines, and other nonfat or low-fat sauces along with the cooking methods mentioned above. Soups and stews should be chilled after cooking. The fat that forms on top after a few hours in the refrigerator should be skimmed off. When preparing meals, avoid using excess salt. Salt can contribute to raising blood pressure in some people. EATING AWAY FROM HOME Order entres, potatoes, and vegetables without sauces or butter. When meat exceeds the size of a deck of cards (3 to 4 ounces), the rest can be taken home for another meal. Choose vegetable or fruit salads and ask for low-calorie salad dressings to be served on the side. Use dressings sparingly. Limit high-fat toppings, such as bacon, crumbled eggs, cheese, sunflower seeds, and olives. Ask for heart-healthy tub margarine instead of butter.  Hemorrhoids Hemorrhoids are dilated (enlarged) veins around the rectum. Sometimes clots will form in the veins. This makes them swollen and painful. These are called thrombosed hemorrhoids. Causes of hemorrhoids include:  Constipation.   Straining to have a bowel movement.   HEAVY LIFTING HOME CARE INSTRUCTIONS  Eat a well balanced diet and drink 6 to 8 glasses of water every day to avoid constipation. You may also use a bulk laxative.   Avoid straining to have bowel movements.   Keep anal area dry and clean.   Do not use a donut  shaped pillow or sit on the toilet for long periods. This increases blood pooling and pain.   Move your bowels when your body has the urge; this will require less straining and will decrease pain and pressure. =
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
## 2013-05-24 NOTE — Telephone Encounter (Signed)
Reminder in EPIC 

## 2013-05-25 NOTE — Op Note (Deleted)
[facility] [address] [place], [address]   COLONOSCOPY PROCEDURE REPORT  PATIENT: Cheryl Scott, Cheryl Scott  MR#: [id number] BIRTHDATE: 1971/03/21 , 64  yrs. old GENDER: Female ENDOSCOPIST: Barney Drain, MD REFERRED [id number], Cletus Gash PROCEDURE DATE:  05/24/2013 PROCEDURE:   Colonoscopy, diagnostic INDICATIONS:Patient's immediate family history of colon cancer-high risk screening MEDICATIONS: MAC sedation, administered by CRNA  DESCRIPTION OF PROCEDURE:    Physical exam was performed.  Informed consent was obtained from the patient after explaining the benefits, risks, and alternatives to procedure.  The patient was connected to monitor and placed in left lateral position. Continuous oxygen was provided by nasal cannula and IV medicine administered through an indwelling cannula.  After administration of sedation and rectal exam, the patients rectum was intubated and the     colonoscope was advanced under direct visualization to the cecum.  The scope was removed slowly by carefully examining the color, texture, anatomy, and integrity mucosa on the way out.  The patient was recovered in endoscopy and discharged home in satisfactory condition.    COLON FINDINGS: Mild diverticulosis was noted in the descending colon and sigmoid colon.  , The colon mucosa was otherwise normal. , Large internal hemorrhoids were found.  , and The colon was redundant.  The patient was moved on to their back & stomach to reach the cecum.  Manual abdominal counter-pressure was used to reach the cecum.  PREP QUALITY: good. CECAL W/D TIME: 9 minutes     COMPLICATIONS: None  ENDOSCOPIC IMPRESSION: 1.   Mild diverticulosis was noted in the descending colon and sigmoid colon 2.   The colon mucosa was otherwise normal 3.   Large internal hemorrhoids 4.   The colon was redundant   RECOMMENDATIONS: CONTINUE WEIGHT LOSS EFFORTS. CONTINUE OMEPRAZOLE TWICE DAILY. AVOID ITEMS THAT TRIGGER  GASTRITIS. FOLLOW A LOW FAT/HIGH FIBER DIET. AVOID ITEMS THAT CAUSE BLOATING. BIOPSY WILL BE BACK IN 7 DAYS. FOLLOW UP IN 3 MOS. Next colonoscopy in 5 years       _______________________________ eSigned:  Barney Drain, MD 05/24/2013 4:47 PM     PATIENT NAME:  Cheryl Scott, Cheryl Scott MR#: [id number]

## 2013-05-25 NOTE — Op Note (Deleted)
[facility] [address] [place], [address]   ENDOSCOPY PROCEDURE REPORT  PATIENT: Cheryl Scott, Cheryl Scott  MR#: [id number] BIRTHDATE: 1970-12-04 , 70  yrs. old GENDER: Female  ENDOSCOPIST: Barney Drain, MD REFERRED [id number] Dennard Schaumann, M.D.  PROCEDURE DATE: 05/24/2013 PROCEDURE:   EGD w/ biopsy  INDICATIONS:Dyspepsia. BMI 41 MEDICATIONS: MAC sedation, administered by CRNA TOPICAL ANESTHETIC:   Cetacaine Spray  DESCRIPTION OF PROCEDURE:     Physical exam was performed.  Informed consent was obtained from the patient after explaining the benefits, risks, and alternatives to the procedure.  The patient was connected to the monitor and placed in the left lateral position.  Continuous oxygen was provided by nasal cannula and IV medicine administered through an indwelling cannula.  After administration of sedation, the patients esophagus was intubated and the     endoscope was advanced under direct visualization to the second portion of the duodenum.  The scope was removed slowly by carefully examining the color, texture, anatomy, and integrity of the mucosa on the way out.  The patient was recovered in endoscopy and discharged home in satisfactory condition.   ESOPHAGUS: A Schatzki ring was found at the gastroesophageal junction and was widely open.   STOMACH: Multiple medium sized irregular shaped and serpiginous ulcers were found on the greater curvature of the gastric body.  Biopsies were taken around the ulcers.   DUODENUM: Mild duodenal inflammation was found in the duodenal bulb.   The duodenal mucosa showed no abnormalities in the 2nd part of the duodenum.  Cold forcep biopsies were taken in the second portion. COMPLICATIONS:   None  ENDOSCOPIC IMPRESSION: 1.   Schatzki ring  at the gastroesophageal junction 2.   Multiple medium sized ulcers  on the greater curvature of the gastric body 3.   MILD DUODENITIS  RECOMMENDATIONS: CONTINUE WEIGHT LOSS  EFFORTS. CONTINUE OMEPRAZOLE TWICE DAILY. AVOID ITEMS THAT TRIGGER GASTRITIS. FOLLOW A LOW FAT/HIGH FIBER DIET. AVOID ITEMS THAT CAUSE BLOATING. BIOPSY WILL BE BACK IN 7 DAYS.  FOLLOW UP IN 3 MOS.  REPEAT EXAM:   _______________________________ Cheryl MaisBarney Drain, MD 05/24/2013 4:43 PM       PATIENT NAME:  Scott, Cheryl MR#: [id number]

## 2013-05-26 ENCOUNTER — Encounter (HOSPITAL_COMMUNITY): Payer: Self-pay | Admitting: Gastroenterology

## 2013-06-01 NOTE — Op Note (Signed)
[facility] [address] [place], [address]   ENDOSCOPY PROCEDURE REPORT  PATIENT: Cheryl, Scott  MR#: [id number] BIRTHDATE: 1971/01/10 , 47  yrs. old GENDER: Female  ENDOSCOPIST: Barney Drain, MD REFERRED [id number] Dennard Schaumann, M.D.  PROCEDURE DATE: 05/24/2013 PROCEDURE:   EGD w/ biopsy  INDICATIONS:Dyspepsia. BMI 41 MEDICATIONS: MAC sedation, administered by CRNA TOPICAL ANESTHETIC:   Cetacaine Spray  DESCRIPTION OF PROCEDURE:     Physical exam was performed.  Informed consent was obtained from the patient after explaining the benefits, risks, and alternatives to the procedure.  The patient was connected to the monitor and placed in the left lateral position.  Continuous oxygen was provided by nasal cannula and IV medicine administered through an indwelling cannula.  After administration of sedation, the patients esophagus was intubated and the     endoscope was advanced under direct visualization to the second portion of the duodenum.  The scope was removed slowly by carefully examining the color, texture, anatomy, and integrity of the mucosa on the way out.  The patient was recovered in endoscopy and discharged home in satisfactory condition.   ESOPHAGUS: A Schatzki ring was found at the gastroesophageal junction and was widely open.   STOMACH: Multiple medium sized irregular shaped and serpiginous ulcers were found on the greater curvature of the gastric body.  Biopsies were taken around the ulcers.   DUODENUM: Mild duodenal inflammation was found in the duodenal bulb.   The duodenal mucosa showed no abnormalities in the 2nd part of the duodenum.  Cold forcep biopsies were taken in the second portion. COMPLICATIONS:   None  ENDOSCOPIC IMPRESSION: 1.   Schatzki ring  at the gastroesophageal junction 2.   Multiple medium sized ulcers  on the greater curvature of the gastric body 3.   MILD DUODENITIS  RECOMMENDATIONS: CONTINUE WEIGHT LOSS  EFFORTS. CONTINUE OMEPRAZOLE TWICE DAILY. AVOID ITEMS THAT TRIGGER GASTRITIS. FOLLOW A LOW FAT/HIGH FIBER DIET. AVOID ITEMS THAT CAUSE BLOATING. BIOPSY WILL BE BACK IN 7 DAYS.  FOLLOW UP IN 3 MOS.  REPEAT EXAM:   _______________________________ Lorrin MaisBarney Drain, MD 05/24/2013 4:43 PM       PATIENT NAME:  Cheryl, Scott MR#: [id number]

## 2013-06-01 NOTE — Op Note (Signed)
[facility] [address] [place], [address]   COLONOSCOPY PROCEDURE REPORT  PATIENT: Cheryl, Scott  MR#: [id number] BIRTHDATE: 1970-11-10 , 45  yrs. old GENDER: Female ENDOSCOPIST: Barney Drain, MD REFERRED [id number], Cletus Gash PROCEDURE DATE:  05/24/2013 PROCEDURE:   Colonoscopy, diagnostic INDICATIONS:Patient's immediate family history of colon cancer-high risk screening MEDICATIONS: MAC sedation, administered by CRNA  DESCRIPTION OF PROCEDURE:    Physical exam was performed.  Informed consent was obtained from the patient after explaining the benefits, risks, and alternatives to procedure.  The patient was connected to monitor and placed in left lateral position. Continuous oxygen was provided by nasal cannula and IV medicine administered through an indwelling cannula.  After administration of sedation and rectal exam, the patients rectum was intubated and the     colonoscope was advanced under direct visualization to the cecum.  The scope was removed slowly by carefully examining the color, texture, anatomy, and integrity mucosa on the way out.  The patient was recovered in endoscopy and discharged home in satisfactory condition.    COLON FINDINGS: Mild diverticulosis was noted in the descending colon and sigmoid colon.  , The colon mucosa was otherwise normal. , Large internal hemorrhoids were found.  , and The colon was redundant.  The patient was moved on to their back & stomach to reach the cecum.  Manual abdominal counter-pressure was used to reach the cecum.  PREP QUALITY: good. CECAL W/D TIME: 9 minutes     COMPLICATIONS: None  ENDOSCOPIC IMPRESSION: 1.   Mild diverticulosis was noted in the descending colon and sigmoid colon 2.   The colon mucosa was otherwise normal 3.   Large internal hemorrhoids 4.   The colon was redundant   RECOMMENDATIONS: CONTINUE WEIGHT LOSS EFFORTS. CONTINUE OMEPRAZOLE TWICE DAILY. AVOID ITEMS THAT TRIGGER  GASTRITIS. FOLLOW A LOW FAT/HIGH FIBER DIET. AVOID ITEMS THAT CAUSE BLOATING. BIOPSY WILL BE BACK IN 7 DAYS. FOLLOW UP IN 3 MOS. Next colonoscopy in 5 years       _______________________________ eSigned:  Barney Drain, MD 05/24/2013 4:47 PM     PATIENT NAME:  Cheryl, Scott MR#: [id number]

## 2013-06-05 DIAGNOSIS — K648 Other hemorrhoids: Secondary | ICD-10-CM

## 2013-06-05 DIAGNOSIS — K259 Gastric ulcer, unspecified as acute or chronic, without hemorrhage or perforation: Secondary | ICD-10-CM

## 2013-06-05 HISTORY — DX: Gastric ulcer, unspecified as acute or chronic, without hemorrhage or perforation: K25.9

## 2013-06-05 HISTORY — DX: Other hemorrhoids: K64.8

## 2013-06-06 ENCOUNTER — Telehealth: Payer: Self-pay | Admitting: Gastroenterology

## 2013-06-06 NOTE — Telephone Encounter (Signed)
Called and informed pt.  

## 2013-06-06 NOTE — Telephone Encounter (Signed)
REMINDER APPTS ALREADY MADE

## 2013-06-06 NOTE — Telephone Encounter (Signed)
Please call pt. HER stomach Bx shows gastritis.   CONTINUE YOUR WEIGHT LOSS EFFORTS. CONTINUE OMEPRAZOLE TWICE DAILY. AVOID ITEMS THAT TRIGGER GASTRITIS.   FOLLOW A LOW FAT/HIGH FIBER DIET. AVOID ITEMS THAT CAUSE BLOATING..  FOLLOW UP IN 3 MOS E30 ABD PAIN, GASTRITIS.  TCS IN 5 YEARS.

## 2013-06-07 ENCOUNTER — Other Ambulatory Visit: Payer: Self-pay | Admitting: Physician Assistant

## 2013-06-08 NOTE — Telephone Encounter (Signed)
She really needs to be seen for followup of this.

## 2013-06-08 NOTE — Telephone Encounter (Signed)
?   OK to Refill  

## 2013-07-07 ENCOUNTER — Other Ambulatory Visit: Payer: Self-pay | Admitting: Obstetrics and Gynecology

## 2013-07-07 DIAGNOSIS — N6310 Unspecified lump in the right breast, unspecified quadrant: Secondary | ICD-10-CM

## 2013-07-13 ENCOUNTER — Ambulatory Visit (INDEPENDENT_AMBULATORY_CARE_PROVIDER_SITE_OTHER): Payer: 59 | Admitting: Physician Assistant

## 2013-07-13 ENCOUNTER — Encounter: Payer: Self-pay | Admitting: Physician Assistant

## 2013-07-13 VITALS — BP 122/76 | HR 64 | Temp 97.3°F | Resp 18 | Wt 266.0 lb

## 2013-07-13 DIAGNOSIS — I10 Essential (primary) hypertension: Secondary | ICD-10-CM

## 2013-07-13 DIAGNOSIS — K648 Other hemorrhoids: Secondary | ICD-10-CM

## 2013-07-13 DIAGNOSIS — E669 Obesity, unspecified: Secondary | ICD-10-CM

## 2013-07-13 DIAGNOSIS — K219 Gastro-esophageal reflux disease without esophagitis: Secondary | ICD-10-CM

## 2013-07-13 DIAGNOSIS — M5090 Cervical disc disorder, unspecified, unspecified cervical region: Secondary | ICD-10-CM

## 2013-07-13 DIAGNOSIS — K259 Gastric ulcer, unspecified as acute or chronic, without hemorrhage or perforation: Secondary | ICD-10-CM

## 2013-07-13 DIAGNOSIS — E559 Vitamin D deficiency, unspecified: Secondary | ICD-10-CM

## 2013-07-13 DIAGNOSIS — H8110 Benign paroxysmal vertigo, unspecified ear: Secondary | ICD-10-CM

## 2013-07-13 HISTORY — DX: Cervical disc disorder, unspecified, unspecified cervical region: M50.90

## 2013-07-13 MED ORDER — LISINOPRIL 10 MG PO TABS: 10.0000 mg | ORAL_TABLET | Freq: Every day | ORAL | Status: DC

## 2013-07-13 MED ORDER — MECLIZINE HCL 25 MG PO TABS: 25.0000 mg | ORAL_TABLET | Freq: Two times a day (BID) | ORAL | Status: DC | PRN

## 2013-07-13 MED ORDER — OMEPRAZOLE 20 MG PO CPDR: 20.0000 mg | DELAYED_RELEASE_CAPSULE | Freq: Two times a day (BID) | ORAL | Status: DC
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
## 2013-07-14 ENCOUNTER — Encounter: Payer: Self-pay | Admitting: Gastroenterology

## 2013-07-15 ENCOUNTER — Ambulatory Visit
Admission: RE | Admit: 2013-07-15 | Discharge: 2013-07-15 | Disposition: A | Discharge status: Home / Self Care | Payer: 59 | Source: Ambulatory Visit | Attending: Obstetrics and Gynecology | Admitting: Obstetrics and Gynecology

## 2013-07-15 DIAGNOSIS — N6310 Unspecified lump in the right breast, unspecified quadrant: Secondary | ICD-10-CM

## 2013-11-04 ENCOUNTER — Other Ambulatory Visit: Payer: Self-pay | Admitting: Physician Assistant

## 2013-11-04 NOTE — Telephone Encounter (Signed)
Medication refilled per protocol. 

## 2013-12-13 ENCOUNTER — Telehealth: Payer: Self-pay | Admitting: Family Medicine

## 2013-12-13 NOTE — Telephone Encounter (Signed)
Patient would like to know if we can call in diflucan for her if possible  [phone]

## 2013-12-14 MED ORDER — FLUCONAZOLE 150 MG PO TABS: 150.0000 mg | ORAL_TABLET | Freq: Once | ORAL | Status: DC

## 2013-12-14 NOTE — Telephone Encounter (Signed)
Message copied by Olena Mater on Wed Dec 14, 2013 11:36 AM ------      Message from: Lenore Manner      Created: Wed Dec 14, 2013 10:20 AM      Regarding: RTN Call       Contact: [phone]       RTn Call ------

## 2013-12-14 NOTE — Telephone Encounter (Signed)
Tell her I will treat with Diflucan--but,  if discharge does not resolve after taking this--then  she needs to come in for a visit so that we can further evaluate. Can send prescription for Diflucan 150 mg 1 by mouth x1 -- #   1 with 0 refills

## 2013-12-14 NOTE — Telephone Encounter (Signed)
Pt c/o of white creamy discharge x 3-4 days. Has had before, request diflucan.

## 2013-12-14 NOTE — Telephone Encounter (Signed)
Pt called and made aware of provider recommedations.  Rx to pharmacy

## 2014-01-11 ENCOUNTER — Ambulatory Visit (INDEPENDENT_AMBULATORY_CARE_PROVIDER_SITE_OTHER): Payer: 59 | Admitting: Physician Assistant

## 2014-01-11 ENCOUNTER — Encounter: Payer: Self-pay | Admitting: Physician Assistant

## 2014-01-11 VITALS — BP 118/86 | HR 60 | Temp 97.5°F | Resp 18 | Wt 266.0 lb

## 2014-01-11 DIAGNOSIS — F429 Obsessive-compulsive disorder, unspecified: Secondary | ICD-10-CM

## 2014-01-11 DIAGNOSIS — M5090 Cervical disc disorder, unspecified, unspecified cervical region: Secondary | ICD-10-CM

## 2014-01-11 DIAGNOSIS — F411 Generalized anxiety disorder: Secondary | ICD-10-CM

## 2014-01-11 DIAGNOSIS — R7309 Other abnormal glucose: Secondary | ICD-10-CM

## 2014-01-11 DIAGNOSIS — E559 Vitamin D deficiency, unspecified: Secondary | ICD-10-CM

## 2014-01-11 DIAGNOSIS — K219 Gastro-esophageal reflux disease without esophagitis: Secondary | ICD-10-CM

## 2014-01-11 DIAGNOSIS — Z800 Family history of malignant neoplasm of digestive organs: Secondary | ICD-10-CM

## 2014-01-11 DIAGNOSIS — R809 Proteinuria, unspecified: Secondary | ICD-10-CM

## 2014-01-11 DIAGNOSIS — Z9989 Dependence on other enabling machines and devices: Secondary | ICD-10-CM

## 2014-01-11 DIAGNOSIS — K648 Other hemorrhoids: Secondary | ICD-10-CM

## 2014-01-11 DIAGNOSIS — R739 Hyperglycemia, unspecified: Secondary | ICD-10-CM | POA: Insufficient documentation

## 2014-01-11 DIAGNOSIS — G4733 Obstructive sleep apnea (adult) (pediatric): Secondary | ICD-10-CM

## 2014-01-11 DIAGNOSIS — I10 Essential (primary) hypertension: Secondary | ICD-10-CM

## 2014-01-11 DIAGNOSIS — F419 Anxiety disorder, unspecified: Secondary | ICD-10-CM

## 2014-01-11 DIAGNOSIS — K259 Gastric ulcer, unspecified as acute or chronic, without hemorrhage or perforation: Secondary | ICD-10-CM

## 2014-01-11 LAB — COMPLETE METABOLIC PANEL WITH GFR
ALT: 11 U/L (ref 0–35)
AST: 11 U/L (ref 0–37)
Albumin: 3.9 g/dL (ref 3.5–5.2)
Alkaline Phosphatase: 90 U/L (ref 39–117)
BUN: 17 mg/dL (ref 6–23)
CO2: 26 mEq/L (ref 19–32)
Calcium: 8.9 mg/dL (ref 8.4–10.5)
Chloride: 101 mEq/L (ref 96–112)
Creat: 1.26 mg/dL — ABNORMAL HIGH (ref 0.50–1.10)
GFR, Est African American: 60 mL/min
GFR, Est Non African American: 52 mL/min — ABNORMAL LOW
Glucose, Bld: 101 mg/dL — ABNORMAL HIGH (ref 70–99)
Potassium: 4.1 mEq/L (ref 3.5–5.3)
Sodium: 136 mEq/L (ref 135–145)
Total Bilirubin: 0.3 mg/dL (ref 0.2–1.2)
Total Protein: 7.1 g/dL (ref 6.0–8.3)

## 2014-01-11 LAB — HEMOGLOBIN A1C
Hgb A1c MFr Bld: 6.4 % — ABNORMAL HIGH (ref <5.7)
Mean Plasma Glucose: 137 mg/dL — ABNORMAL HIGH (ref <117)

## 2014-01-11 MED ORDER — MECLIZINE HCL 25 MG PO TABS: ORAL_TABLET | ORAL | Status: DC
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
## 2014-01-12 ENCOUNTER — Other Ambulatory Visit: Payer: Self-pay | Admitting: Obstetrics and Gynecology

## 2014-01-13 LAB — CYTOLOGY - PAP

## 2014-02-03 ENCOUNTER — Other Ambulatory Visit: Payer: Self-pay | Admitting: Physician Assistant

## 2014-02-03 NOTE — Telephone Encounter (Signed)
Prescription sent to pharmacy.

## 2014-04-04 ENCOUNTER — Other Ambulatory Visit: Payer: Self-pay | Admitting: Physician Assistant

## 2014-04-17 ENCOUNTER — Ambulatory Visit: Payer: Self-pay | Admitting: Physician Assistant

## 2014-04-19 ENCOUNTER — Ambulatory Visit: Payer: 59 | Admitting: Physician Assistant

## 2014-04-26 ENCOUNTER — Ambulatory Visit (INDEPENDENT_AMBULATORY_CARE_PROVIDER_SITE_OTHER): Payer: 59 | Admitting: Physician Assistant

## 2014-04-26 ENCOUNTER — Encounter: Payer: Self-pay | Admitting: Physician Assistant

## 2014-04-26 VITALS — BP 114/72 | HR 54 | Temp 98.3°F | Resp 18 | Wt 262.0 lb

## 2014-04-26 DIAGNOSIS — M5090 Cervical disc disorder, unspecified, unspecified cervical region: Secondary | ICD-10-CM

## 2014-04-26 DIAGNOSIS — K259 Gastric ulcer, unspecified as acute or chronic, without hemorrhage or perforation: Secondary | ICD-10-CM

## 2014-04-26 DIAGNOSIS — E66813 Obesity, class 3: Secondary | ICD-10-CM

## 2014-04-26 DIAGNOSIS — Z800 Family history of malignant neoplasm of digestive organs: Secondary | ICD-10-CM

## 2014-04-26 DIAGNOSIS — R739 Hyperglycemia, unspecified: Secondary | ICD-10-CM

## 2014-04-26 DIAGNOSIS — F429 Obsessive-compulsive disorder, unspecified: Secondary | ICD-10-CM

## 2014-04-26 DIAGNOSIS — Z9989 Dependence on other enabling machines and devices: Secondary | ICD-10-CM

## 2014-04-26 DIAGNOSIS — G4733 Obstructive sleep apnea (adult) (pediatric): Secondary | ICD-10-CM

## 2014-04-26 DIAGNOSIS — R7309 Other abnormal glucose: Secondary | ICD-10-CM

## 2014-04-26 DIAGNOSIS — I10 Essential (primary) hypertension: Secondary | ICD-10-CM

## 2014-04-26 DIAGNOSIS — R809 Proteinuria, unspecified: Secondary | ICD-10-CM

## 2014-04-26 DIAGNOSIS — F411 Generalized anxiety disorder: Secondary | ICD-10-CM

## 2014-04-26 DIAGNOSIS — K219 Gastro-esophageal reflux disease without esophagitis: Secondary | ICD-10-CM

## 2014-04-26 DIAGNOSIS — F419 Anxiety disorder, unspecified: Secondary | ICD-10-CM

## 2014-04-26 DIAGNOSIS — E559 Vitamin D deficiency, unspecified: Secondary | ICD-10-CM

## 2014-04-26 LAB — LIPID PANEL
Cholesterol: 159 mg/dL (ref 0–200)
HDL: 51 mg/dL (ref >39)
LDL Cholesterol: 83 mg/dL (ref 0–99)
Total CHOL/HDL Ratio: 3.1 Ratio
Triglycerides: 124 mg/dL (ref <150)
VLDL: 25 mg/dL (ref 0–40)

## 2014-04-26 LAB — COMPLETE METABOLIC PANEL WITH GFR
ALT: 12 U/L (ref 0–35)
AST: 14 U/L (ref 0–37)
Albumin: 4.1 g/dL (ref 3.5–5.2)
Alkaline Phosphatase: 87 U/L (ref 39–117)
BUN: 13 mg/dL (ref 6–23)
CO2: 25 mEq/L (ref 19–32)
Calcium: 9.4 mg/dL (ref 8.4–10.5)
Chloride: 103 mEq/L (ref 96–112)
Creat: 1.39 mg/dL — ABNORMAL HIGH (ref 0.50–1.10)
GFR, Est African American: 53 mL/min — ABNORMAL LOW
GFR, Est Non African American: 46 mL/min — ABNORMAL LOW
Glucose, Bld: 89 mg/dL (ref 70–99)
Potassium: 4.7 mEq/L (ref 3.5–5.3)
Sodium: 139 mEq/L (ref 135–145)
Total Bilirubin: 0.4 mg/dL (ref 0.2–1.2)
Total Protein: 7.1 g/dL (ref 6.0–8.3)

## 2014-04-26 LAB — HEMOGLOBIN A1C
Hgb A1c MFr Bld: 6.4 % — ABNORMAL HIGH (ref <5.7)
Mean Plasma Glucose: 137 mg/dL — ABNORMAL HIGH (ref <117)
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
## 2014-05-01 ENCOUNTER — Telehealth: Payer: Self-pay | Admitting: Family Medicine

## 2014-05-01 MED ORDER — MOMETASONE FUROATE 50 MCG/ACT NA SUSP: 2.0000 | Freq: Every day | NASAL | Status: DC

## 2014-05-01 NOTE — Telephone Encounter (Signed)
Patient is calling about test results  [phone]

## 2014-05-01 NOTE — Telephone Encounter (Signed)
Pt called needed nasonex sent in, was not sent at last visit Also gave her the lab results

## 2014-05-03 NOTE — Telephone Encounter (Signed)
Pt aware see lab note from 05/02/14

## 2014-05-29 ENCOUNTER — Other Ambulatory Visit: Payer: Self-pay | Admitting: Family Medicine

## 2014-05-29 NOTE — Telephone Encounter (Signed)
Medication refilled per protocol. 

## 2014-07-14 ENCOUNTER — Ambulatory Visit: Payer: 59 | Admitting: Family Medicine

## 2014-07-27 ENCOUNTER — Other Ambulatory Visit: Payer: Self-pay | Admitting: Physician Assistant

## 2014-07-27 NOTE — Telephone Encounter (Signed)
Medication refilled per protocol. 

## 2014-07-28 ENCOUNTER — Other Ambulatory Visit: Payer: Self-pay | Admitting: Physician Assistant

## 2014-07-31 NOTE — Telephone Encounter (Signed)
Medication refilled per protocol. 

## 2014-09-20 ENCOUNTER — Ambulatory Visit (INDEPENDENT_AMBULATORY_CARE_PROVIDER_SITE_OTHER): Payer: 59 | Admitting: Physician Assistant

## 2014-09-20 ENCOUNTER — Other Ambulatory Visit: Payer: Self-pay | Admitting: Family Medicine

## 2014-09-20 ENCOUNTER — Encounter: Payer: Self-pay | Admitting: Physician Assistant

## 2014-09-20 VITALS — BP 130/82 | HR 60 | Temp 98.2°F | Resp 18 | Wt 269.0 lb

## 2014-09-20 DIAGNOSIS — E559 Vitamin D deficiency, unspecified: Secondary | ICD-10-CM | POA: Diagnosis not present

## 2014-09-20 DIAGNOSIS — I10 Essential (primary) hypertension: Secondary | ICD-10-CM

## 2014-09-20 DIAGNOSIS — R739 Hyperglycemia, unspecified: Secondary | ICD-10-CM | POA: Diagnosis not present

## 2014-09-20 DIAGNOSIS — G4733 Obstructive sleep apnea (adult) (pediatric): Secondary | ICD-10-CM

## 2014-09-20 DIAGNOSIS — Z800 Family history of malignant neoplasm of digestive organs: Secondary | ICD-10-CM | POA: Diagnosis not present

## 2014-09-20 DIAGNOSIS — Z23 Encounter for immunization: Secondary | ICD-10-CM | POA: Diagnosis not present

## 2014-09-20 DIAGNOSIS — F419 Anxiety disorder, unspecified: Secondary | ICD-10-CM

## 2014-09-20 DIAGNOSIS — K219 Gastro-esophageal reflux disease without esophagitis: Secondary | ICD-10-CM

## 2014-09-20 DIAGNOSIS — Z9989 Dependence on other enabling machines and devices: Secondary | ICD-10-CM

## 2014-09-20 DIAGNOSIS — R809 Proteinuria, unspecified: Secondary | ICD-10-CM | POA: Diagnosis not present

## 2014-09-20 DIAGNOSIS — E1165 Type 2 diabetes mellitus with hyperglycemia: Secondary | ICD-10-CM | POA: Diagnosis not present

## 2014-09-20 LAB — HEMOGLOBIN A1C
Hgb A1c MFr Bld: 6.2 % — ABNORMAL HIGH (ref <5.7)
Mean Plasma Glucose: 131 mg/dL — ABNORMAL HIGH (ref <117)

## 2014-09-20 LAB — COMPLETE METABOLIC PANEL WITH GFR
ALT: 14 U/L (ref 0–35)
AST: 13 U/L (ref 0–37)
Albumin: 3.9 g/dL (ref 3.5–5.2)
Alkaline Phosphatase: 86 U/L (ref 39–117)
BUN: 10 mg/dL (ref 6–23)
CO2: 27 mEq/L (ref 19–32)
Calcium: 8.7 mg/dL (ref 8.4–10.5)
Chloride: 101 mEq/L (ref 96–112)
Creat: 1.26 mg/dL — ABNORMAL HIGH (ref 0.50–1.10)
GFR, Est African American: 59 mL/min — ABNORMAL LOW
GFR, Est Non African American: 52 mL/min — ABNORMAL LOW
Glucose, Bld: 106 mg/dL — ABNORMAL HIGH (ref 70–99)
Potassium: 4.2 mEq/L (ref 3.5–5.3)
Sodium: 137 mEq/L (ref 135–145)
Total Bilirubin: 0.3 mg/dL (ref 0.2–1.2)
Total Protein: 7.2 g/dL (ref 6.0–8.3)

## 2014-09-20 LAB — MICROALBUMIN, URINE: Microalb, Ur: 25.8 mg/dL — ABNORMAL HIGH (ref <2.0)
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
## 2014-09-21 ENCOUNTER — Telehealth: Payer: Self-pay | Admitting: Family Medicine

## 2014-09-21 LAB — VITAMIN D 25 HYDROXY (VIT D DEFICIENCY, FRACTURES): Vit D, 25-Hydroxy: 42 ng/mL (ref 30–100)

## 2014-09-21 MED ORDER — LISINOPRIL 20 MG PO TABS: 20.0000 mg | ORAL_TABLET | Freq: Every day | ORAL | Status: DC

## 2014-09-21 NOTE — Telephone Encounter (Signed)
-----   Message from Orlena Sheldon, PA-C sent at 09/21/2014  8:16 AM EST ----- At OV BP was good at 130/82.  But, she has high microalbumin.  Will increase dose of ACE Inh--Lisinopril to protect her kidneys.  Increase lisinopril from 10mg  to 20mg  1 po QD Remainder of labs stable. Cont all other treatment the same.

## 2014-09-21 NOTE — Telephone Encounter (Signed)
Pt aware of lab results and provider recommendations.  Rx for increase Lisinopril to pharmacy.  Reminded pt to follow up in 3 months

## 2014-10-09 ENCOUNTER — Telehealth: Payer: Self-pay | Admitting: Physician Assistant

## 2014-10-09 NOTE — Telephone Encounter (Signed)
°  [phone]  PT is needing something called in for her pain she is having for bone spurs She can't take indoprofen [facility] [address]

## 2014-10-12 NOTE — Telephone Encounter (Signed)
Patient needs appointment.

## 2014-10-13 ENCOUNTER — Encounter: Payer: Self-pay | Admitting: Family Medicine

## 2014-10-13 ENCOUNTER — Ambulatory Visit (INDEPENDENT_AMBULATORY_CARE_PROVIDER_SITE_OTHER): Payer: 59 | Admitting: Family Medicine

## 2014-10-13 VITALS — BP 128/74 | HR 72 | Temp 97.9°F | Resp 16 | Ht 67.0 in | Wt 259.0 lb

## 2014-10-13 DIAGNOSIS — M5090 Cervical disc disorder, unspecified, unspecified cervical region: Secondary | ICD-10-CM | POA: Diagnosis not present

## 2014-10-13 DIAGNOSIS — G542 Cervical root disorders, not elsewhere classified: Secondary | ICD-10-CM

## 2014-10-13 DIAGNOSIS — J0100 Acute maxillary sinusitis, unspecified: Secondary | ICD-10-CM | POA: Diagnosis not present

## 2014-10-13 MED ORDER — METHYLPREDNISOLONE 4 MG PO KIT: PACK | ORAL | Status: DC

## 2014-10-13 MED ORDER — HYDROCODONE-ACETAMINOPHEN 5-325 MG PO TABS: 1.0000 | ORAL_TABLET | Freq: Four times a day (QID) | ORAL | Status: DC | PRN

## 2014-10-13 MED ORDER — FLUCONAZOLE 150 MG PO TABS: 150.0000 mg | ORAL_TABLET | Freq: Once | ORAL | Status: DC

## 2014-10-13 MED ORDER — AZITHROMYCIN 250 MG PO TABS: ORAL_TABLET | ORAL | Status: DC

## 2014-10-13 MED ORDER — DIAZEPAM 5 MG PO TABS: ORAL_TABLET | ORAL | Status: DC

## 2014-10-13 NOTE — Progress Notes (Signed)
Patient ID: Cheryl Scott, female   DOB: 06/14/1971, 44 y.o.   MRN: [id number]   Subjective:    Patient ID: Cheryl Scott, female    DOB: 1971/05/16, 44 y.o.   MRN: [id number]  Patient presents for Bone Spurs  patient here with worsening episodes of neck pain on the left side that radiates to the left arm. She is complaining about this for the past 3 years. She had x-rays done back in 2013 when the symptoms started that showed degenerative changes in her cervical spine as well as multiple bone spurs they did discuss with her going to neurosurgery at that time but she declining intervention and just took some type of oral medication until resolved. Forcefully over the past year she's had an episode every 1-2 months where she gets severe pain in the left side of her neck that goes down to her left arm she gets tingling and numbness in the hand the episodes have been occurring more frequently and they have been more painful. She has tried taking ibuprofen and Tylenol over-the-counter with minimal improvement. For the past couple weeks she is in avoiding all NSAIDs because her chronic kidney disease.  She also complains of sinus pressure with some mild drainage over the past week. She's been using Sudafed typically when her sinuses cleared up she gets vertigo therefore she's been taking a couple days the meclizine as well. She has not had any fever, she's had very minimal cough. She is also had some mild allergy symptoms with some watering eyes that occurred the past couple of days    Review Of Systems:  GEN- denies fatigue, fever, weight loss,weakness, recent illness HEENT- denies eye drainage, change in vision, +nasal discharge, CVS- denies chest pain, palpitations RESP- denies SOB, cough, wheeze ABD- denies N/V, change in stools, abd pain GU- denies dysuria, hematuria, dribbling, incontinence MSK- + joint pain, muscle aches, injury Neuro- denies headache, dizziness, syncope, seizure  activity       Objective:    BP 128/74 mmHg  Pulse 72  Temp(Src) 97.9 F (36.6 C) (Oral)  Resp 16  Ht 5\' 7"  (1.702 m)  Wt 259 lb (117.482 kg)  BMI 40.56 kg/m2  LMP 10/02/2014 (Approximate) GEN- NAD, alert and oriented x3 HEENT- PERRL, EOMI, non injected sclera, pink conjunctiva, MMM, oropharynx clear, TM clear bilat no effusion,  + maxillary sinus tenderness, inflammed turbinates,  Nasal drainage  Neck- Supple,decreased ROM, mild spasm along left trapezius, + spurlings left side CVS- RRR, no murmur RESP-CTAB MSK- Left shoulder- decreased ROM, biceps in tact, pain with lifting above 90 degress, pain with back scratch and reach across EXT- No edema Pulses- Radial 2+         Assessment & Plan:      Problem List Items Addressed This Visit      Unprioritized   Cervical nerve root impingement   Cervical neck pain with evidence of disc disease   Relevant Medications   methylPREDNISolone (MEDROL, PAK,) 4 MG tablet   NORCO 5-325 MG PO TABS    Other Visit Diagnoses    Acute maxillary sinusitis, recurrence not specified    -  Primary    sinusitis with underlying allergies, add OTC anti-histamin, zpak given, steroids should also help    Relevant Medications    methylPREDNISolone (MEDROL, PAK,) 4 MG tablet    azithromycin (ZITHROMAX) tablet    fluconazole (DIFLUCAN) tablet 150 mg       Note: This dictation was prepared with Dragon dictation  along with smaller phrase technology. Any transcriptional errors that result from this process are unintentional.

## 2014-10-13 NOTE — Patient Instructions (Signed)
Take antibiotics for sinus infection  Take diflucan at end of antibiotics Medrol dosepak for inflammation MRI of neck to be done Hydrocodone for pain F/U pending results

## 2014-10-13 NOTE — Assessment & Plan Note (Signed)
I think she is getting some nerve root impingement possibly from the spurs as well as degenerative disc disease in her neck. I'm going to obtain an MRI of her neck I'm concerned about the progressiveness of her symptoms. I've given her Medrol Dosepak as well as hydrocodone for pain. She will need of Valium before she goes into the open MRI as she has severe claustrophobia she understands not to take her Klonopin that morning

## 2014-10-16 ENCOUNTER — Telehealth: Payer: Self-pay | Admitting: *Deleted

## 2014-10-16 MED ORDER — HYDROCODONE-ACETAMINOPHEN 10-325 MG PO TABS: 1.0000 | ORAL_TABLET | Freq: Four times a day (QID) | ORAL | Status: DC | PRN

## 2014-10-16 NOTE — Telephone Encounter (Signed)
Patient in office to request medication increase on Hydrocodone.   States that she spoke with MD on the phone after hours and was advised to take (2) Hydrocodone 5/325mg  for pain.   Requested to have MD write for (1) pill so that she did not have to take (2).   MD made aware and new orders obtained for Hydrcodone/ APAP 10/325mg  PO Q 6 Hours PRN, #30.   Prescription printed and given to patient.

## 2014-10-16 NOTE — Telephone Encounter (Signed)
Patient has appointment at [facility] on march 29 at 4:00pm, pt is to arrive at 3:45pm no prep required, left message on vm to return my call

## 2014-10-17 ENCOUNTER — Telehealth: Payer: Self-pay | Admitting: Physician Assistant

## 2014-10-17 NOTE — Telephone Encounter (Signed)
[phone]  I made pt aware of her apt at [facility] and she said it needs to be at GI in the open face scan. Can you please contact the pt regarding this.

## 2014-10-18 NOTE — Telephone Encounter (Signed)
I contacted [facility] and cancelled pt MRI that was scheduled for next week and also contacted [facility] and they are going to call pt and schedule appt with her, LM on VM of these plans.

## 2014-10-23 ENCOUNTER — Telehealth: Payer: Self-pay | Admitting: Physician Assistant

## 2014-10-23 NOTE — Telephone Encounter (Signed)
Let pt know she needs to have the MRI, there is nothing more I can do in the office with her symptoms And she cant stay on on the chronic pain medications without any imaging

## 2014-10-23 NOTE — Telephone Encounter (Signed)
Patient calling to get refill on hydrocodone  [phone]

## 2014-10-23 NOTE — Telephone Encounter (Signed)
Pt has appt scheduled for tomorrow,please see phone note.

## 2014-10-23 NOTE — Telephone Encounter (Signed)
Just refilled 3/21 #30.  OK refill?

## 2014-10-23 NOTE — Telephone Encounter (Signed)
Cheryl Scott ,  is the MRI of neck   scheduled, also see how much pain medication pt is taking, she has to make the medication last as it is highly addictive She can not get another refill on this medication  Friday

## 2014-10-24 ENCOUNTER — Other Ambulatory Visit (HOSPITAL_COMMUNITY): Payer: Self-pay

## 2014-10-24 NOTE — Telephone Encounter (Signed)
Pt has MRI scheduled at [facility] on 10/29/14 at 1pm, pt is aware of appt

## 2014-10-24 NOTE — Telephone Encounter (Signed)
Patient is calling to see if there is anything she can take for pain  [phone]

## 2014-10-24 NOTE — Telephone Encounter (Signed)
LMTRC

## 2014-10-24 NOTE — Telephone Encounter (Signed)
lmtrc

## 2014-10-24 NOTE — Telephone Encounter (Signed)
Pt called back and I informed her that [facility] is suppose to contact her in scheduling appt and she stated they called but wasn't home, informed pt that she needs to call them back and set up appt with them, and see if she can try to get today if not for this week.   Informed pt that Dr. [name] can not prescribe any narcotics until imaging is done.  Pt is going to call today to schedule appt

## 2014-10-24 NOTE — Telephone Encounter (Signed)
Pt has CKD and can not take Ibuprofen wants to know what else she can take and tylenol is not touching the pain. Mri is set up for 10/29/14, is in pain now

## 2014-10-24 NOTE — Telephone Encounter (Signed)
She can take tylenol 500mg  (2 )tabs twice a day , she can not get pain medication right now

## 2014-10-25 NOTE — Telephone Encounter (Signed)
Pt called back and aware that provider will not prescribe any pain medications at this time

## 2014-10-29 ENCOUNTER — Ambulatory Visit
Admission: RE | Admit: 2014-10-29 | Discharge: 2014-10-29 | Disposition: A | Discharge status: Home / Self Care | Payer: Self-pay | Source: Ambulatory Visit | Attending: Family Medicine | Admitting: Family Medicine

## 2014-10-29 DIAGNOSIS — G542 Cervical root disorders, not elsewhere classified: Secondary | ICD-10-CM

## 2014-10-29 DIAGNOSIS — M5090 Cervical disc disorder, unspecified, unspecified cervical region: Secondary | ICD-10-CM

## 2014-10-30 ENCOUNTER — Encounter: Payer: Self-pay | Admitting: Family Medicine

## 2014-10-30 ENCOUNTER — Ambulatory Visit (INDEPENDENT_AMBULATORY_CARE_PROVIDER_SITE_OTHER): Payer: 59 | Admitting: Family Medicine

## 2014-10-30 VITALS — BP 128/70 | HR 68 | Temp 98.5°F | Resp 16 | Ht 67.0 in | Wt 268.0 lb

## 2014-10-30 DIAGNOSIS — G542 Cervical root disorders, not elsewhere classified: Secondary | ICD-10-CM

## 2014-10-30 DIAGNOSIS — M47812 Spondylosis without myelopathy or radiculopathy, cervical region: Secondary | ICD-10-CM

## 2014-10-30 MED ORDER — HYDROCODONE-ACETAMINOPHEN 10-325 MG PO TABS: 1.0000 | ORAL_TABLET | Freq: Three times a day (TID) | ORAL | Status: DC | PRN

## 2014-10-30 NOTE — Progress Notes (Signed)
Patient ID: Cheryl Scott, female   DOB: 03-30-1971, 44 y.o.   MRN: [id number]   Subjective:    Patient ID: Cheryl Scott, female    DOB: 09/26/1970, 44 y.o.   MRN: [id number]  Patient presents for F/U Pain  Patient here to follow-up MRI of neck. She had MRI of neck done secondary to radicular symptoms down her left arm with tingling numbness as well as neck pain. She has had symptoms for the past 2 years which more frequently she has been having more flares and abdomen progressively worsening. The MRI was just obtained last night. She is out of her pain medication.  IMPRESSION: Spondylosis at C4-5, C5-6 and C6-7. Canal narrowing without cord compression. Mild foraminal narrowing on the left at C4-5. Foraminal stenosis bilaterally at C5-6 that could cause neural compression. Foraminal stenosis bilaterally at C6-7 that could cause neural compression. This is more severe on the left than the right and the left C7 nerve root is at particular risk.   Review Of Systems:  GEN- denies fatigue, fever, weight loss,weakness, recent illness HEENT- denies eye drainage, change in vision, nasal discharge, CVS- denies chest pain, palpitations RESP- denies SOB, cough, wheeze ABD- denies N/V, change in stools, abd pain GU- denies dysuria, hematuria, dribbling, incontinence MSK- +joint pain, muscle aches, injury Neuro- denies headache, dizziness, syncope, seizure activity       Objective:    BP 128/70 mmHg  Pulse 68  Temp(Src) 98.5 F (36.9 C) (Oral)  Resp 16  Ht 5\' 7"  (1.702 m)  Wt 268 lb (121.564 kg)  BMI 41.96 kg/m2  LMP 10/27/2014 GEN- NAD, alert and oriented x3        Assessment & Plan:      Problem List Items Addressed This Visit      Unprioritized   Cervical nerve root impingement   Relevant Orders   Ambulatory referral to Neurosurgery    Other Visit Diagnoses    Cervical spondylosis without myelopathy    -  Primary    Referral to neurosurgery for evaluation, did  not respond to medrol dosepak, with her psychiatric meds I am concerned about Lyrica or gabapentin though I think these would help with the pain. Refilled Norco  She can take TID until her surgery evaluation    Relevant Medications    HYDROcodone-acetaminophen (NORCO) 10-325 MG per tablet    Other Relevant Orders    Ambulatory referral to Neurosurgery       Note: This dictation was prepared with Dragon dictation along with smaller phrase technology. Any transcriptional errors that result from this process are unintentional.

## 2014-10-30 NOTE — Patient Instructions (Signed)
Referral to neurosurgeon Continue pain medication three times a day  Ask Psychiatrist about Lyrica or Gabapentin for nerve pain  F/U as needed

## 2014-11-27 ENCOUNTER — Other Ambulatory Visit: Payer: Self-pay | Admitting: Physician Assistant

## 2014-11-27 MED ORDER — HYDROCODONE-ACETAMINOPHEN 10-325 MG PO TABS: 1.0000 | ORAL_TABLET | Freq: Three times a day (TID) | ORAL | Status: DC | PRN

## 2014-11-27 NOTE — Telephone Encounter (Signed)
MD please advise

## 2014-11-27 NOTE — Telephone Encounter (Signed)
Okay to refill, I need note back from Neurosurgery- Dr. [name]

## 2014-11-27 NOTE — Telephone Encounter (Signed)
Patient calling to get refill on hydrocodone  [phone] when ready

## 2014-11-27 NOTE — Telephone Encounter (Signed)
?   Ok to refill  Last refill 10/30/14  Last ov 10/30/14

## 2014-11-27 NOTE — Telephone Encounter (Signed)
Prescription printed and patient made aware to come to office to pick up.  

## 2014-12-21 ENCOUNTER — Ambulatory Visit (INDEPENDENT_AMBULATORY_CARE_PROVIDER_SITE_OTHER): Payer: 59 | Admitting: Physician Assistant

## 2014-12-21 ENCOUNTER — Encounter: Payer: Self-pay | Admitting: Physician Assistant

## 2014-12-21 VITALS — BP 110/80 | HR 67 | Temp 98.1°F | Resp 19 | Wt 262.0 lb

## 2014-12-21 DIAGNOSIS — E66813 Obesity, class 3: Secondary | ICD-10-CM

## 2014-12-21 DIAGNOSIS — Z9989 Dependence on other enabling machines and devices: Secondary | ICD-10-CM

## 2014-12-21 DIAGNOSIS — K259 Gastric ulcer, unspecified as acute or chronic, without hemorrhage or perforation: Secondary | ICD-10-CM

## 2014-12-21 DIAGNOSIS — K219 Gastro-esophageal reflux disease without esophagitis: Secondary | ICD-10-CM

## 2014-12-21 DIAGNOSIS — R809 Proteinuria, unspecified: Secondary | ICD-10-CM | POA: Diagnosis not present

## 2014-12-21 DIAGNOSIS — I10 Essential (primary) hypertension: Secondary | ICD-10-CM | POA: Diagnosis not present

## 2014-12-21 DIAGNOSIS — E1165 Type 2 diabetes mellitus with hyperglycemia: Secondary | ICD-10-CM

## 2014-12-21 DIAGNOSIS — E119 Type 2 diabetes mellitus without complications: Secondary | ICD-10-CM | POA: Insufficient documentation

## 2014-12-21 DIAGNOSIS — Z79899 Other long term (current) drug therapy: Secondary | ICD-10-CM | POA: Diagnosis not present

## 2014-12-21 DIAGNOSIS — G542 Cervical root disorders, not elsewhere classified: Secondary | ICD-10-CM

## 2014-12-21 DIAGNOSIS — R739 Hyperglycemia, unspecified: Secondary | ICD-10-CM

## 2014-12-21 DIAGNOSIS — F419 Anxiety disorder, unspecified: Secondary | ICD-10-CM

## 2014-12-21 DIAGNOSIS — G4733 Obstructive sleep apnea (adult) (pediatric): Secondary | ICD-10-CM

## 2014-12-21 DIAGNOSIS — F42 Obsessive-compulsive disorder: Secondary | ICD-10-CM

## 2014-12-21 DIAGNOSIS — E559 Vitamin D deficiency, unspecified: Secondary | ICD-10-CM

## 2014-12-21 DIAGNOSIS — M5090 Cervical disc disorder, unspecified, unspecified cervical region: Secondary | ICD-10-CM | POA: Diagnosis not present

## 2014-12-21 DIAGNOSIS — F429 Obsessive-compulsive disorder, unspecified: Secondary | ICD-10-CM

## 2014-12-21 LAB — HEMOGLOBIN A1C, FINGERSTICK: Hgb A1C (fingerstick): 5.9 % — ABNORMAL HIGH (ref <5.7)

## 2014-12-21 MED ORDER — HYDROCODONE-ACETAMINOPHEN 10-325 MG PO TABS: 1.0000 | ORAL_TABLET | Freq: Three times a day (TID) | ORAL | Status: DC | PRN
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
## 2015-01-01 ENCOUNTER — Other Ambulatory Visit: Payer: Self-pay | Admitting: Physician Assistant

## 2015-01-19 ENCOUNTER — Other Ambulatory Visit: Payer: Self-pay | Admitting: Obstetrics and Gynecology

## 2015-01-22 LAB — CYTOLOGY - PAP

## 2015-01-26 ENCOUNTER — Telehealth: Payer: Self-pay | Admitting: Physician Assistant

## 2015-01-26 DIAGNOSIS — G542 Cervical root disorders, not elsewhere classified: Secondary | ICD-10-CM

## 2015-01-26 DIAGNOSIS — M5090 Cervical disc disorder, unspecified, unspecified cervical region: Secondary | ICD-10-CM

## 2015-01-26 MED ORDER — HYDROCODONE-ACETAMINOPHEN 10-325 MG PO TABS: 1.0000 | ORAL_TABLET | Freq: Three times a day (TID) | ORAL | Status: DC | PRN

## 2015-01-26 MED ORDER — LISINOPRIL 20 MG PO TABS: 20.0000 mg | ORAL_TABLET | Freq: Every day | ORAL | Status: DC

## 2015-01-26 NOTE — Telephone Encounter (Signed)
Left pt message rx ready after 2PM today

## 2015-01-26 NOTE — Telephone Encounter (Signed)
Lisinopril to pharmacy.  LRF hydrocodone 12/21/14 #90  LOV 12/21/14  OK refill?

## 2015-01-26 NOTE — Telephone Encounter (Signed)
Patient is calling to get refills on her lisinopril and hydrocodone, her husband lost his job wants to know if these can be written for 90 day supply  [facility] for the l[facility]ril please call her when rx for her hydrocodone can be picked up at [phone]

## 2015-01-26 NOTE — Telephone Encounter (Signed)
Okay to refill? 

## 2015-02-07 ENCOUNTER — Ambulatory Visit (INDEPENDENT_AMBULATORY_CARE_PROVIDER_SITE_OTHER): Payer: Commercial Managed Care - HMO | Admitting: Physician Assistant

## 2015-02-07 ENCOUNTER — Encounter: Payer: Self-pay | Admitting: Physician Assistant

## 2015-02-07 VITALS — BP 128/66 | HR 68 | Temp 98.2°F | Resp 14 | Ht 67.0 in | Wt 268.0 lb

## 2015-02-07 DIAGNOSIS — E1165 Type 2 diabetes mellitus with hyperglycemia: Secondary | ICD-10-CM

## 2015-02-07 DIAGNOSIS — G542 Cervical root disorders, not elsewhere classified: Secondary | ICD-10-CM

## 2015-02-07 DIAGNOSIS — I10 Essential (primary) hypertension: Secondary | ICD-10-CM | POA: Diagnosis not present

## 2015-02-07 DIAGNOSIS — R809 Proteinuria, unspecified: Secondary | ICD-10-CM | POA: Diagnosis not present

## 2015-02-07 DIAGNOSIS — R300 Dysuria: Secondary | ICD-10-CM

## 2015-02-07 DIAGNOSIS — M5090 Cervical disc disorder, unspecified, unspecified cervical region: Secondary | ICD-10-CM

## 2015-02-07 LAB — URINALYSIS, ROUTINE W REFLEX MICROSCOPIC
Bilirubin Urine: NEGATIVE
Glucose, UA: NEGATIVE mg/dL
Ketones, ur: NEGATIVE mg/dL
Leukocytes, UA: NEGATIVE
Nitrite: NEGATIVE
Protein, ur: 100 mg/dL — AB
Specific Gravity, Urine: 1.025 (ref 1.005–1.030)
Urobilinogen, UA: 0.2 mg/dL (ref 0.0–1.0)
pH: 5.5 (ref 5.0–8.0)

## 2015-02-07 LAB — URINALYSIS, MICROSCOPIC ONLY
Casts: NONE SEEN
Crystals: NONE SEEN

## 2015-02-07 MED ORDER — HYDROCODONE-ACETAMINOPHEN 10-325 MG PO TABS: 1.0000 | ORAL_TABLET | Freq: Three times a day (TID) | ORAL | Status: DC | PRN

## 2015-02-07 MED ORDER — MECLIZINE HCL 25 MG PO TABS: ORAL_TABLET | ORAL | Status: DC

## 2015-02-07 NOTE — Progress Notes (Signed)
Patient ID: Cheryl Scott MRN: [id number], DOB: 1970/08/19, 44 y.o. Date of Encounter: 02/07/2015, 4:41 PM    Chief Complaint:  Chief Complaint  Patient presents with  . Kidney pain    urine sample continues to have protein in it.     HPI: 44 y.o. year old AA female says that she came in for a couple of reasons today.  One reason was that at prior visits she has seen the diagnosis of proteinuria and chronic kidney disease on her printout. She was concerned about this and wanted to follow this up. I discussed that chronic kidney disease generally does not cause any back pain. She says that she was just having a "pinching" sensation in her back and wasn't sure if it was related.  I reviewed that her microalbumin was elevated at last check 09/20/14. At that time we increased her lisinopril dose.  Also reviewed with her her past creatinines and GFR is. These have been stable dating back to 04/2013.  Also discussed that most kidney disease is caused by uncontrolled hypertension and diabetes. Discussed that we are keeping her blood pressure and diabetes controlled and that she is on ACE inhibitor which is the one medication known to help protect kidneys.  Also discussed avoiding NSAIDs.  She says that her husband was laid off from his job and will be losing that insurance soon. However says that he has already found a new job but will not get the new insurance for 90 days. Therefore is wanting to get refills on medications before she has the lapse of insurance coverage. Says that she just picked up a 90 day supply of her lisinopril so does not need that one but is wanting a refill on her meclizine and hydrocodone.     Home Meds:   Outpatient Prescriptions Prior to Visit  Medication Sig Dispense Refill  . ARIPiprazole (ABILIFY) 5 MG tablet Take 5 mg by mouth daily.  3  . Cholecalciferol (VITAMIN D) 2000 UNITS CAPS Take by mouth daily.    . Eszopiclone 3 MG TABS Take 3 mg by mouth  at bedtime.  0  . lamoTRIgine (LAMICTAL) 100 MG tablet Take 100 mg by mouth 2 (two) times daily.     Marland Kitchen lisdexamfetamine (VYVANSE) 50 MG capsule Take 50 mg by mouth every morning. Patient states she is not taking. Has not in over one month.    . lisinopril (PRINIVIL,ZESTRIL) 20 MG tablet Take 1 tablet (20 mg total) by mouth daily. 90 tablet 1  . mometasone (NASONEX) 50 MCG/ACT nasal spray Place 2 sprays into the nose daily. 17 g 6  . Multiple Vitamin (MULTIVITAMIN) capsule Take 1 capsule by mouth daily.    Marland Kitchen omeprazole (PRILOSEC) 20 MG capsule TAKE 1 CAPSULE (20 MG TOTAL) BY MOUTH 2 (TWO) TIMES DAILY. 60 capsule 3  . sertraline (ZOLOFT) 100 MG tablet Take 100 mg by mouth daily.  3  . HYDROcodone-acetaminophen (NORCO) 10-325 MG per tablet Take 1 tablet by mouth 3 (three) times daily as needed. Do not fill until 12/28/2014 90 tablet 0  . meclizine (ANTIVERT) 25 MG tablet TAKE 1 TABLET TWICE A DAY AS NEEDED FOR DIZZINESS 30 tablet 1  . clonazePAM (KLONOPIN) 0.5 MG tablet Take 0.5 mg by mouth 2 (two) times daily as needed.       No facility-administered medications prior to visit.    Allergies:  Allergies  Allergen Reactions  . Diuretic [Buchu-Cornsilk-Ch Grass-Hydran] Anaphylaxis  . Ace Inhibitors Cough  .  Codeine   . Penicillins Hives and Itching  . Seroquel [Quetiapine]     Hypotension, alt mental status      Review of Systems: See HPI for pertinent ROS. All other ROS negative.    Physical Exam: Blood pressure 128/66, pulse 68, temperature 98.2 F (36.8 C), temperature source Oral, resp. rate 14, height 5\' 7"  (1.702 m), weight 268 lb (121.564 kg), last menstrual period 01/08/2015., Body mass index is 41.96 kg/(m^2). General:  [name]. Appears in no acute distress. Neck: Supple. No thyromegaly. No lymphadenopathy. Lungs: Clear bilaterally to auscultation without wheezes, rales, or rhonchi. Breathing is unlabored. Heart: Regular rhythm. No murmurs, rubs, or gallops. Abdomen: Soft,  non-tender, non-distended with normoactive bowel sounds. No hepatomegaly. No rebound/guarding. No obvious abdominal masses. Msk:  Strength and tone normal for age. Extremities/Skin: Warm and dry. Neuro: Alert and oriented X 3. Moves all extremities spontaneously. Gait is normal. CNII-XII grossly in tact. Psych:  Responds to questions appropriately with a normal affect.     ASSESSMENT AND PLAN:  44 y.o. year old female with  1. Dysuria - Urinalysis, Routine w reflex microscopic (not at [facility]) 2. Proteinuria 3. Essential hypertension 4. Type 2 diabetes mellitus with hyperglycemia   I reviewed that her microalbumin was elevated at last check 09/20/14. At that time we increased her lisinopril dose.  Also reviewed with her her past creatinines and GFR is. These have been stable dating back to 04/2013.  Also discussed that most kidney disease is caused by uncontrolled hypertension and diabetes. Discussed that we are keeping her blood pressure and diabetes controlled and that she is on ACE inhibitor which is the one medication known to help protect kidneys.  Also discussed avoiding NSAIDs.    5. Cervical neck pain with evidence of disc disease 6. Cervical nerve root impingement - HYDROcodone-acetaminophen (NORCO) 10-325 MG per tablet; Take 1 tablet by mouth 3 (three) times daily as needed.  Dispense: 90 tablet; Refill: 0 She was last given a prescription of hydrocodone 01/26/15 for #90. She says that she knows she is getting this early that just needs to be able to fill it while she has insurance. Will note that next prescriptions should not be given until 03/29/15.  [address] [place], [place], [facility] 02/07/2015 4:41 PM

## 2015-02-16 NOTE — Progress Notes (Signed)
REVIEWED-NO ADDITIONAL RECOMMENDATIONS. 

## 2015-02-20 ENCOUNTER — Ambulatory Visit: Payer: Self-pay

## 2015-02-27 ENCOUNTER — Ambulatory Visit: Payer: Self-pay

## 2015-03-06 ENCOUNTER — Ambulatory Visit (INDEPENDENT_AMBULATORY_CARE_PROVIDER_SITE_OTHER): Payer: Commercial Managed Care - HMO | Admitting: Family Medicine

## 2015-03-06 ENCOUNTER — Ambulatory Visit: Payer: Self-pay

## 2015-03-06 ENCOUNTER — Encounter: Payer: Self-pay | Admitting: Family Medicine

## 2015-03-06 VITALS — BP 128/80 | HR 68 | Temp 98.2°F | Resp 14 | Ht 67.0 in | Wt 262.0 lb

## 2015-03-06 DIAGNOSIS — J392 Other diseases of pharynx: Secondary | ICD-10-CM

## 2015-03-06 DIAGNOSIS — G542 Cervical root disorders, not elsewhere classified: Secondary | ICD-10-CM

## 2015-03-06 DIAGNOSIS — M5090 Cervical disc disorder, unspecified, unspecified cervical region: Secondary | ICD-10-CM

## 2015-03-06 MED ORDER — METHYLPREDNISOLONE 4 MG PO TBPK: ORAL_TABLET | ORAL | Status: DC

## 2015-03-06 MED ORDER — HYDROCODONE-ACETAMINOPHEN 10-325 MG PO TABS: 1.0000 | ORAL_TABLET | Freq: Three times a day (TID) | ORAL | Status: DC | PRN

## 2015-03-06 NOTE — Progress Notes (Signed)
Patient ID: Cheryl Scott, female   DOB: 04-14-1971, 44 y.o.   MRN: [id number]   Subjective:    Patient ID: Cheryl Scott, female    DOB: [birth year], 44 y.o.   MRN: [id number]  Patient presents for Upper/ Mid- back Pain   patient here with recurrent neck pain and upper back pain. She has known cervical disc disease with nerve root impingement she has been seen by neurosurgery and she had epidural injection which helped minimally. She was supposed to follow-up if her pain did not improve she thought she needed to come to our office first. She then tells me that she lost the prescription for her pain medication given to her last month and she needs this refilled. She continues to have tingling numbness in her hand as well as pain with range of motion. She also states yesterday she was around some people that were smoking in this causing irritation to her throat she feels like it is swollen in the back. She denies any previous sore throat no fever or note nasal discharge or sinus drainage.   Review Of Systems:  GEN- denies fatigue, fever, weight loss,weakness, recent illness HEENT- denies eye drainage, change in vision, nasal discharge, CVS- denies chest pain, palpitations RESP- denies SOB, cough, wheeze ABD- denies N/V, change in stools, abd pain GU- denies dysuria, hematuria, dribbling, incontinence MSK- +joint pain, muscle aches, injury Neuro- denies headache, dizziness, syncope, seizure activity       Objective:    BP 128/80 mmHg  Pulse 68  Temp(Src) 98.2 F (36.8 C) (Oral)  Resp 14  Ht 5\' 7"  (1.702 m)  Wt 262 lb (118.842 kg)  BMI 41.03 kg/m2  LMP 02/20/2015 GEN- NAD, alert and oriented x3 HEENT- PERRL, EOMI, non injected sclera, pink conjunctiva, MMM, oropharynx no erythema, mild swelling of uvula but midline Neck- Supple, no thyromegaly, no LAD, decreased ROM CVS- RRR, no murmur RESP-CTAB         Assessment & Plan:      Problem List Items Addressed This Visit    Cervical nerve root impingement   Relevant Medications   HYDROcodone-acetaminophen (NORCO) 10-325 MG per tablet   Cervical neck pain with evidence of disc disease - Primary     She has known cervical disc disease with impingement. Her pain is getting worse. I recommended that she follow-up with her neurosurgeon she can just call for an appointment as this is an open referral. She will likely need surgical intervention. I've given her Medrol Dosepak because the swelling of the uvula as well as her neck pain. I also refilled her hydrocodone. Note she asked for an increased quantity or stronger medication however I do not feel comfortable with this with her psychiatric medications.      Relevant Medications   methylPREDNISolone (MEDROL DOSEPAK) 4 MG TBPK tablet   HYDROcodone-acetaminophen (NORCO) 10-325 MG per tablet    Other Visit Diagnoses    Throat irritation         very mild swelling of Uvula after exposure to tobacco smoke, no other signs of infection, given depo medrol, no respiratory distress noted ,salt water gargle       Note: This dictation was prepared with Dragon dictation along with smaller phrase technology. Any transcriptional errors that result from this process are unintentional.

## 2015-03-06 NOTE — Assessment & Plan Note (Signed)
She has known cervical disc disease with impingement. Her pain is getting worse. I recommended that she follow-up with her neurosurgeon she can just call for an appointment as this is an open referral. She will likely need surgical intervention. I've given her Medrol Dosepak because the swelling of the uvula as well as her neck pain. I also refilled her hydrocodone. Note she asked for an increased quantity or stronger medication however I do not feel comfortable with this with her psychiatric medications.

## 2015-03-06 NOTE — Patient Instructions (Signed)
Restart pain medication Take steroid dosepak  Steroid shot given  Continue heating pad to area F/U as needed

## 2015-03-22 ENCOUNTER — Ambulatory Visit (INDEPENDENT_AMBULATORY_CARE_PROVIDER_SITE_OTHER): Payer: Commercial Managed Care - HMO | Admitting: Physician Assistant

## 2015-03-22 ENCOUNTER — Encounter: Payer: Self-pay | Admitting: Physician Assistant

## 2015-03-22 VITALS — BP 128/62 | HR 78 | Temp 97.9°F | Resp 16 | Ht 67.0 in | Wt 261.0 lb

## 2015-03-22 DIAGNOSIS — R809 Proteinuria, unspecified: Secondary | ICD-10-CM

## 2015-03-22 DIAGNOSIS — Z800 Family history of malignant neoplasm of digestive organs: Secondary | ICD-10-CM | POA: Diagnosis not present

## 2015-03-22 DIAGNOSIS — F429 Obsessive-compulsive disorder, unspecified: Secondary | ICD-10-CM

## 2015-03-22 DIAGNOSIS — Z9989 Dependence on other enabling machines and devices: Secondary | ICD-10-CM

## 2015-03-22 DIAGNOSIS — E559 Vitamin D deficiency, unspecified: Secondary | ICD-10-CM

## 2015-03-22 DIAGNOSIS — E66813 Obesity, class 3: Secondary | ICD-10-CM

## 2015-03-22 DIAGNOSIS — K625 Hemorrhage of anus and rectum: Secondary | ICD-10-CM

## 2015-03-22 DIAGNOSIS — G4733 Obstructive sleep apnea (adult) (pediatric): Secondary | ICD-10-CM

## 2015-03-22 DIAGNOSIS — K259 Gastric ulcer, unspecified as acute or chronic, without hemorrhage or perforation: Secondary | ICD-10-CM

## 2015-03-22 DIAGNOSIS — E1165 Type 2 diabetes mellitus with hyperglycemia: Secondary | ICD-10-CM

## 2015-03-22 DIAGNOSIS — F42 Obsessive-compulsive disorder: Secondary | ICD-10-CM

## 2015-03-22 DIAGNOSIS — Z79899 Other long term (current) drug therapy: Secondary | ICD-10-CM | POA: Diagnosis not present

## 2015-03-22 DIAGNOSIS — F419 Anxiety disorder, unspecified: Secondary | ICD-10-CM | POA: Diagnosis not present

## 2015-03-22 DIAGNOSIS — M5090 Cervical disc disorder, unspecified, unspecified cervical region: Secondary | ICD-10-CM

## 2015-03-22 DIAGNOSIS — G542 Cervical root disorders, not elsewhere classified: Secondary | ICD-10-CM

## 2015-03-22 DIAGNOSIS — I10 Essential (primary) hypertension: Secondary | ICD-10-CM | POA: Diagnosis not present

## 2015-03-22 DIAGNOSIS — K219 Gastro-esophageal reflux disease without esophagitis: Secondary | ICD-10-CM | POA: Diagnosis not present

## 2015-03-22 LAB — COMPLETE METABOLIC PANEL WITH GFR
ALT: 13 U/L (ref 6–29)
AST: 12 U/L (ref 10–35)
Albumin: 3.9 g/dL (ref 3.6–5.1)
Alkaline Phosphatase: 81 U/L (ref 33–115)
BUN: 10 mg/dL (ref 7–25)
CO2: 26 mmol/L (ref 20–31)
Calcium: 8.5 mg/dL — ABNORMAL LOW (ref 8.6–10.2)
Chloride: 104 mmol/L (ref 98–110)
Creat: 1.21 mg/dL — ABNORMAL HIGH (ref 0.50–1.10)
GFR, Est African American: 62 mL/min (ref >=60)
GFR, Est Non African American: 54 mL/min — ABNORMAL LOW (ref >=60)
Glucose, Bld: 102 mg/dL — ABNORMAL HIGH (ref 70–99)
Potassium: 4 mmol/L (ref 3.5–5.3)
Sodium: 138 mmol/L (ref 135–146)
Total Bilirubin: 0.2 mg/dL (ref 0.2–1.2)
Total Protein: 6.5 g/dL (ref 6.1–8.1)

## 2015-03-22 LAB — HEMOGLOBIN A1C
Hgb A1c MFr Bld: 5.9 % — ABNORMAL HIGH (ref <5.7)
Mean Plasma Glucose: 123 mg/dL — ABNORMAL HIGH (ref <117)

## 2015-03-22 MED ORDER — LISINOPRIL 20 MG PO TABS: 20.0000 mg | ORAL_TABLET | Freq: Every day | ORAL | Status: DC

## 2015-03-22 MED ORDER — HYDROCODONE-ACETAMINOPHEN 10-325 MG PO TABS: 1.0000 | ORAL_TABLET | Freq: Three times a day (TID) | ORAL | Status: DC | PRN
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
## 2015-03-23 ENCOUNTER — Telehealth: Payer: Self-pay | Admitting: Family Medicine

## 2015-03-23 NOTE — Telephone Encounter (Signed)
What? She has been on this for quite a while. Did not mention anything about this at [place].  Find out what "problem with her stomach" she is having.  If constipation, then tell her to use Mirilax for this.

## 2015-03-23 NOTE — Telephone Encounter (Signed)
Pt says the Hydrocodone is upsetting her stomach.  Is there something else she can use for pain that will not?

## 2015-03-26 MED ORDER — OXYCODONE-ACETAMINOPHEN 5-325 MG PO TABS: 1.0000 | ORAL_TABLET | Freq: Three times a day (TID) | ORAL | Status: DC | PRN

## 2015-03-26 NOTE — Telephone Encounter (Signed)
Percocet 5/325  One 3 times a day PRN #90 + 0

## 2015-03-26 NOTE — Telephone Encounter (Signed)
She has been seeing Dr. [name] regarding this pain.  I did review Dr. Dorian Heckle LOV note about this pain and that note says pt to F/U WITH NEUROSURGEON.  Cannot use Tramadol with her Psych Meds.  Therefore, not much option remaining.  Tell her to stop the hydrocodone and f/u with neurosurgeon.

## 2015-03-26 NOTE — Telephone Encounter (Signed)
rx ready for pick up and pt aware

## 2015-03-26 NOTE — Telephone Encounter (Signed)
Pt says it is causing her stomach cramping and nausea.  Is taking with food.  Says forgot to mention to you at appt.

## 2015-03-26 NOTE — Telephone Encounter (Signed)
Recommended to patient to follow up with Neuro surgery.  Saw them in June and had steroid injection.  Said she has used Percocet in the past and that did not upset her stomach.  Sitting home in pain.

## 2015-04-04 ENCOUNTER — Telehealth: Payer: Self-pay | Admitting: Family Medicine

## 2015-04-04 NOTE — Telephone Encounter (Signed)
Patient had been seeing me for her diabetes and chronic medical problems but all of her office visits regarding neck pain had been with Dr. [name].  I again reviewed Dr. Dorian Heckle last office note regarding this neck pain and that was on 03/06/15. Patient was to be following up with neurosurgery regarding her pain. At that visit patient was requesting increased pain medication but Dr. [name] did not feel comfortable with this. In the interim, phone call got routed to me regarding pain medication-- so I changed it to the Percocet to hold her over until follow-up with neuro neurosurgeon. Find out whether patient has a follow-up appointment with neurosurgery. She needs to follow-up with neurosurgery. I am NOT going to continue to prescribe pain medications. In the interim-- until she sees neurosurgery-- can use MiraLAX to relieve her constipation from her Percocet.

## 2015-04-04 NOTE — Telephone Encounter (Signed)
Percocet has helped her pain but has her very constipated.  Not passing anything which has increased pain.  Please advise.  Have tried [place], juice, MOM, stool softener.

## 2015-04-04 NOTE — Telephone Encounter (Signed)
Told pt not changing pain meds.  Needs to get constipation under control.  Use Miralax, push fluids and fiber.

## 2015-04-11 ENCOUNTER — Other Ambulatory Visit: Payer: Self-pay | Admitting: Physician Assistant

## 2015-04-11 MED ORDER — LISINOPRIL 20 MG PO TABS: 20.0000 mg | ORAL_TABLET | Freq: Every day | ORAL | Status: DC

## 2015-04-11 NOTE — Telephone Encounter (Signed)
Reviewed my last note.  NO more pain meds.  Can refill lisinopril for 6 months worth.

## 2015-04-11 NOTE — Telephone Encounter (Signed)
Patient says she had death in the family, she is going to be out of town for 2 weeks, and would like to know if her med can be written and filled early  Percocet and lisinopril     [facility]

## 2015-04-11 NOTE — Telephone Encounter (Signed)
Lisinopril refilled but per MBD NO MORE PAIN meds given, she is suppose to follow up with Neurosurgeon, advised pt to contact her Neuro provider to see if can prescribe early.

## 2015-04-12 ENCOUNTER — Telehealth: Payer: Self-pay | Admitting: Family Medicine

## 2015-04-12 MED ORDER — OXYCODONE-ACETAMINOPHEN 5-325 MG PO TABS: 1.0000 | ORAL_TABLET | Freq: Three times a day (TID) | ORAL | Status: DC | PRN

## 2015-04-12 NOTE — Telephone Encounter (Signed)
Can print prescription for Oxy 5/325 #90+0. She must keep appointment with neurosurgeon.

## 2015-04-12 NOTE — Telephone Encounter (Signed)
Has appt with Dr [name] 05/16/15.  Is out of pain medicine and is asking for refill until can be seen by Neuro.  Please advise?

## 2015-04-12 NOTE — Telephone Encounter (Signed)
rx ready, try to call pt no answer

## 2015-04-12 NOTE — Telephone Encounter (Signed)
Pt aware rx ready

## 2015-04-23 ENCOUNTER — Other Ambulatory Visit: Payer: Self-pay | Admitting: Family Medicine

## 2015-04-23 MED ORDER — OXYCODONE-ACETAMINOPHEN 5-325 MG PO TABS: 1.0000 | ORAL_TABLET | Freq: Three times a day (TID) | ORAL | Status: DC | PRN

## 2015-04-23 NOTE — Telephone Encounter (Signed)
Pt given refill Rx for Oxycodone 04/12/15.  Pt returns today with same RX.  Pharmacy could not fill it(was at out of town pharmacy), said they did not have enough.  Pt returns today to pick up new rx.  New Rx signed by covering provider.  Old Rx was placed in shred box.

## 2015-04-24 NOTE — Telephone Encounter (Signed)
Agree. Approved. 

## 2015-05-08 ENCOUNTER — Other Ambulatory Visit: Payer: Self-pay | Admitting: Physician Assistant

## 2015-05-08 NOTE — Telephone Encounter (Signed)
Refill appropriate and filled per protocol. 

## 2015-05-16 ENCOUNTER — Other Ambulatory Visit: Payer: Self-pay | Admitting: Family Medicine

## 2015-05-16 DIAGNOSIS — M5090 Cervical disc disorder, unspecified, unspecified cervical region: Secondary | ICD-10-CM

## 2015-05-16 NOTE — Telephone Encounter (Signed)
Dr Sande Rives office called and OV requested.

## 2015-05-16 NOTE — Telephone Encounter (Signed)
LRF 04/23/15 #90.  LOV 03/22/15.  Has seen Dr [name], still discussing possible surgery.  She says we need to continue pain management for now.  OK refill?

## 2015-05-16 NOTE — Telephone Encounter (Signed)
Call Dr. De Blanch office and ask them to fax his last 2 OV notes please. (His notes not in epic)

## 2015-05-17 MED ORDER — OXYCODONE-ACETAMINOPHEN 5-325 MG PO TABS: 1.0000 | ORAL_TABLET | Freq: Three times a day (TID) | ORAL | Status: DC | PRN

## 2015-05-17 NOTE — Telephone Encounter (Signed)
LMTCB

## 2015-05-17 NOTE — Telephone Encounter (Signed)
Pt aware RX ready and referral to pain management

## 2015-05-17 NOTE — Telephone Encounter (Signed)
Received office notes by Dr. [name].  Last note dated 04/25/2015. This states that after that visit " she had a cervical epidural shot which did not give her any relief. They went back and reviewed the films again which showed the significant disease at C5-6 and C6-7. She, however, at this time is not ready to consider surgery and wants to just think about it and deal with it as best she can. If she does decide to consider surgery then it would be C5-6 and C6-7 anterior cervical discectomy with fusion and plating. I will see her back any time that she wishes to consider surgery but for now she is released from our care and will follow-up with her medical doctor for further ongoing pain management needs."  Tell pt that we will do a referral to pain clinic for them to manage her chronic pain. In the meantime, we will continue her current pain medications.  This screen is covering up the other screen!!!  Therefore, I cannot see the dose or quantity.  Give Rx for the same dose and quantity as was given at last Rx + 0 Refills

## 2015-05-17 NOTE — Telephone Encounter (Signed)
rx printed and referral made

## 2015-05-17 NOTE — Telephone Encounter (Signed)
Place Referral to Pain Management

## 2015-05-21 ENCOUNTER — Telehealth: Payer: Self-pay | Admitting: Family Medicine

## 2015-05-21 MED ORDER — OXYCODONE-ACETAMINOPHEN 5-325 MG PO TABS: 1.0000 | ORAL_TABLET | Freq: Three times a day (TID) | ORAL | Status: DC | PRN
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
## 2015-06-15 ENCOUNTER — Other Ambulatory Visit: Payer: Self-pay | Admitting: Physician Assistant

## 2015-06-15 DIAGNOSIS — M542 Cervicalgia: Principal | ICD-10-CM

## 2015-06-15 DIAGNOSIS — G8929 Other chronic pain: Secondary | ICD-10-CM

## 2015-06-15 NOTE — Telephone Encounter (Signed)
LRF 05/21/15 #90  LOV 03/22/15  OK refill?

## 2015-06-15 NOTE — Telephone Encounter (Signed)
See phone note from 9/15---appt with Dr. [name] 10/19.

## 2015-06-15 NOTE — Telephone Encounter (Signed)
[phone] Patient is calling to get rx for her percocet

## 2015-06-18 MED ORDER — OXYCODONE-ACETAMINOPHEN 5-325 MG PO TABS: 1.0000 | ORAL_TABLET | Freq: Three times a day (TID) | ORAL | Status: DC | PRN

## 2015-06-18 NOTE — Telephone Encounter (Signed)
Approved. #90+ 0. 

## 2015-06-18 NOTE — Telephone Encounter (Signed)
Left message at Dr Sande Rives office to follow up form October visit.

## 2015-06-18 NOTE — Telephone Encounter (Signed)
Has seen Dr [name].  He wants to do surgery. She is not ready for surgery.  Spoke to referral nurse this morning about referral to Pain management.  She is working on this but will take 1-2 months to schedule.  Request refill of pain med.

## 2015-06-18 NOTE — Telephone Encounter (Signed)
Pt aware ready for pick up 

## 2015-06-25 ENCOUNTER — Encounter: Payer: Self-pay | Admitting: Family Medicine

## 2015-06-25 ENCOUNTER — Other Ambulatory Visit: Payer: Self-pay | Admitting: Physician Assistant

## 2015-06-25 NOTE — Telephone Encounter (Signed)
Medication refilled per protocol. 

## 2015-07-13 ENCOUNTER — Other Ambulatory Visit: Payer: Self-pay | Admitting: Physician Assistant

## 2015-07-13 NOTE — Telephone Encounter (Signed)
LRF 06/18/15 #90  LOV 03/22/15  OK refill?

## 2015-07-13 NOTE — Telephone Encounter (Signed)
Patient is calling for a refill of Percocet.  Please call [phone]

## 2015-07-16 MED ORDER — OXYCODONE-ACETAMINOPHEN 5-325 MG PO TABS: 1.0000 | ORAL_TABLET | Freq: Three times a day (TID) | ORAL | Status: DC | PRN

## 2015-07-16 NOTE — Telephone Encounter (Signed)
There is no Phone note 10/19.

## 2015-07-16 NOTE — Telephone Encounter (Signed)
Those notes are not showing up on my computer. Can you print those for me to review?

## 2015-07-16 NOTE — Telephone Encounter (Signed)
Maudie Mercury showed me a note that we have that documents that prior OV note by Neurosurgeon stated that he recommended surgery but pt did not want surgery at this time. Also, they had performed injection and pt had gotten no relief. Therefore, no further f/u there scheduled. Maudie Mercury states that Pain Clinic Referral still "being worked on" Will forward to [place] to [place] F/U ON

## 2015-07-16 NOTE — Telephone Encounter (Signed)
Neuro surgery not treating any longer because she does not want surgery.  Pain management referral appt still pending. See refill notes from 10/19 and 11/18.

## 2015-07-16 NOTE — Telephone Encounter (Signed)
**  Please call [phone] when prescription is ready to pick-up.

## 2015-07-16 NOTE — Telephone Encounter (Signed)
See LOV note by Dr. [name]. See 2 phone notes from 03/2015.  Refill DENIED.  This is being managed by Neuro surgery.

## 2015-07-16 NOTE — Telephone Encounter (Signed)
See phone note 10/19.  I did get an OV note.  Probably lost in the "Scanning abyss"

## 2015-07-16 NOTE — Telephone Encounter (Signed)
Refill per provider x 1.  Do not fill until 07/18/15.

## 2015-07-18 ENCOUNTER — Encounter: Payer: Self-pay | Admitting: Physician Assistant

## 2015-07-18 ENCOUNTER — Ambulatory Visit (INDEPENDENT_AMBULATORY_CARE_PROVIDER_SITE_OTHER): Payer: Commercial Managed Care - HMO | Admitting: Physician Assistant

## 2015-07-18 VITALS — BP 132/74 | HR 52 | Temp 98.3°F | Resp 18 | Wt 266.0 lb

## 2015-07-18 DIAGNOSIS — E559 Vitamin D deficiency, unspecified: Secondary | ICD-10-CM | POA: Diagnosis not present

## 2015-07-18 DIAGNOSIS — K625 Hemorrhage of anus and rectum: Secondary | ICD-10-CM

## 2015-07-18 DIAGNOSIS — K259 Gastric ulcer, unspecified as acute or chronic, without hemorrhage or perforation: Secondary | ICD-10-CM | POA: Diagnosis not present

## 2015-07-18 DIAGNOSIS — F419 Anxiety disorder, unspecified: Secondary | ICD-10-CM | POA: Diagnosis not present

## 2015-07-18 DIAGNOSIS — Z800 Family history of malignant neoplasm of digestive organs: Secondary | ICD-10-CM | POA: Diagnosis not present

## 2015-07-18 DIAGNOSIS — R809 Proteinuria, unspecified: Secondary | ICD-10-CM | POA: Diagnosis not present

## 2015-07-18 DIAGNOSIS — F429 Obsessive-compulsive disorder, unspecified: Secondary | ICD-10-CM | POA: Diagnosis not present

## 2015-07-18 DIAGNOSIS — I10 Essential (primary) hypertension: Secondary | ICD-10-CM

## 2015-07-18 DIAGNOSIS — G4733 Obstructive sleep apnea (adult) (pediatric): Secondary | ICD-10-CM

## 2015-07-18 DIAGNOSIS — E1165 Type 2 diabetes mellitus with hyperglycemia: Secondary | ICD-10-CM

## 2015-07-18 DIAGNOSIS — G542 Cervical root disorders, not elsewhere classified: Secondary | ICD-10-CM

## 2015-07-18 DIAGNOSIS — M5090 Cervical disc disorder, unspecified, unspecified cervical region: Secondary | ICD-10-CM | POA: Diagnosis not present

## 2015-07-18 DIAGNOSIS — Z79891 Long term (current) use of opiate analgesic: Secondary | ICD-10-CM

## 2015-07-18 DIAGNOSIS — K219 Gastro-esophageal reflux disease without esophagitis: Secondary | ICD-10-CM

## 2015-07-18 DIAGNOSIS — Z9989 Dependence on other enabling machines and devices: Secondary | ICD-10-CM

## 2015-07-18 LAB — COMPLETE METABOLIC PANEL WITH GFR
ALT: 9 U/L (ref 6–29)
AST: 9 U/L — ABNORMAL LOW (ref 10–35)
Albumin: 3.7 g/dL (ref 3.6–5.1)
Alkaline Phosphatase: 77 U/L (ref 33–115)
BUN: 13 mg/dL (ref 7–25)
CO2: 27 mmol/L (ref 20–31)
Calcium: 8.8 mg/dL (ref 8.6–10.2)
Chloride: 100 mmol/L (ref 98–110)
Creat: 1.14 mg/dL — ABNORMAL HIGH (ref 0.50–1.10)
GFR, Est African American: 67 mL/min (ref >=60)
GFR, Est Non African American: 58 mL/min — ABNORMAL LOW (ref >=60)
Glucose, Bld: 111 mg/dL — ABNORMAL HIGH (ref 70–99)
Potassium: 4.3 mmol/L (ref 3.5–5.3)
Sodium: 137 mmol/L (ref 135–146)
Total Bilirubin: 0.2 mg/dL (ref 0.2–1.2)
Total Protein: 6.7 g/dL (ref 6.1–8.1)

## 2015-07-18 LAB — HEMOGLOBIN A1C
Hgb A1c MFr Bld: 6 % — ABNORMAL HIGH (ref <5.7)
Mean Plasma Glucose: 126 mg/dL — ABNORMAL HIGH (ref <117)
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
## 2015-07-19 LAB — DRUG SCREEN, URINE
Amphetamine Screen, Ur: NEGATIVE
Barbiturate Quant, Ur: NEGATIVE
Benzodiazepines.: NEGATIVE
Cocaine Metabolites: NEGATIVE
Creatinine,U: 203.18 mg/dL
Marijuana Metabolite: NEGATIVE
Methadone: NEGATIVE
Opiates: NEGATIVE
Phencyclidine (PCP): NEGATIVE
Propoxyphene: NEGATIVE

## 2015-08-02 ENCOUNTER — Ambulatory Visit (INDEPENDENT_AMBULATORY_CARE_PROVIDER_SITE_OTHER): Payer: Commercial Managed Care - HMO | Admitting: *Deleted

## 2015-08-02 DIAGNOSIS — Z23 Encounter for immunization: Secondary | ICD-10-CM | POA: Diagnosis not present

## 2015-08-02 NOTE — Progress Notes (Signed)
Patient ID: Cheryl Scott, female   DOB: 09-28-1970, 45 y.o.   MRN: [id number]  Patient seen in office for Influenza Vaccination.   Tolerated IM administration well.   Immunization history updated.

## 2015-08-12 ENCOUNTER — Emergency Department (HOSPITAL_COMMUNITY)
Admission: EM | Admit: 2015-08-12 | Discharge: 2015-08-12 | Disposition: A | Discharge status: Home / Self Care | Payer: Commercial Managed Care - HMO | Attending: Emergency Medicine | Admitting: Emergency Medicine

## 2015-08-12 ENCOUNTER — Emergency Department (HOSPITAL_COMMUNITY): Payer: Commercial Managed Care - HMO

## 2015-08-12 ENCOUNTER — Encounter (HOSPITAL_COMMUNITY): Payer: Self-pay | Admitting: *Deleted

## 2015-08-12 DIAGNOSIS — S0990XA Unspecified injury of head, initial encounter: Secondary | ICD-10-CM | POA: Insufficient documentation

## 2015-08-12 DIAGNOSIS — Y9389 Activity, other specified: Secondary | ICD-10-CM | POA: Insufficient documentation

## 2015-08-12 DIAGNOSIS — Y998 Other external cause status: Secondary | ICD-10-CM | POA: Diagnosis not present

## 2015-08-12 DIAGNOSIS — M542 Cervicalgia: Secondary | ICD-10-CM

## 2015-08-12 DIAGNOSIS — Z79899 Other long term (current) drug therapy: Secondary | ICD-10-CM | POA: Diagnosis not present

## 2015-08-12 DIAGNOSIS — E559 Vitamin D deficiency, unspecified: Secondary | ICD-10-CM | POA: Diagnosis not present

## 2015-08-12 DIAGNOSIS — I10 Essential (primary) hypertension: Secondary | ICD-10-CM | POA: Diagnosis not present

## 2015-08-12 DIAGNOSIS — S29002A Unspecified injury of muscle and tendon of back wall of thorax, initial encounter: Secondary | ICD-10-CM | POA: Insufficient documentation

## 2015-08-12 DIAGNOSIS — S199XXA Unspecified injury of neck, initial encounter: Secondary | ICD-10-CM | POA: Diagnosis not present

## 2015-08-12 DIAGNOSIS — S4991XA Unspecified injury of right shoulder and upper arm, initial encounter: Secondary | ICD-10-CM | POA: Diagnosis not present

## 2015-08-12 DIAGNOSIS — Z9981 Dependence on supplemental oxygen: Secondary | ICD-10-CM | POA: Insufficient documentation

## 2015-08-12 DIAGNOSIS — Z87891 Personal history of nicotine dependence: Secondary | ICD-10-CM | POA: Diagnosis not present

## 2015-08-12 DIAGNOSIS — F419 Anxiety disorder, unspecified: Secondary | ICD-10-CM | POA: Diagnosis not present

## 2015-08-12 DIAGNOSIS — F329 Major depressive disorder, single episode, unspecified: Secondary | ICD-10-CM | POA: Diagnosis not present

## 2015-08-12 DIAGNOSIS — G4733 Obstructive sleep apnea (adult) (pediatric): Secondary | ICD-10-CM | POA: Diagnosis not present

## 2015-08-12 DIAGNOSIS — K219 Gastro-esophageal reflux disease without esophagitis: Secondary | ICD-10-CM | POA: Diagnosis not present

## 2015-08-12 DIAGNOSIS — Y92410 Unspecified street and highway as the place of occurrence of the external cause: Secondary | ICD-10-CM | POA: Diagnosis not present

## 2015-08-12 DIAGNOSIS — Z880 Allergy status to penicillin: Secondary | ICD-10-CM | POA: Insufficient documentation

## 2015-08-12 HISTORY — DX: Radiculopathy, cervical region: M54.12

## 2015-08-12 HISTORY — DX: Cervical root disorders, not elsewhere classified: G54.2

## 2015-08-12 MED ORDER — METHOCARBAMOL 500 MG PO TABS: 1000.0000 mg | ORAL_TABLET | Freq: Four times a day (QID) | ORAL | Status: DC | PRN

## 2015-08-12 MED ORDER — OXYCODONE-ACETAMINOPHEN 5-325 MG PO TABS
2.0000 | ORAL_TABLET | Freq: Once | ORAL | Status: AC
Start: 2015-08-12 — End: 2015-08-12
  Administered 2015-08-12: 2 via ORAL
  Filled 2015-08-12: qty 2

## 2015-08-12 MED ORDER — DIAZEPAM 5 MG PO TABS
5.0000 mg | ORAL_TABLET | Freq: Once | ORAL | Status: AC
  Administered 2015-08-12: 5 mg via ORAL
  Filled 2015-08-12: qty 1

## 2015-08-12 NOTE — ED Notes (Signed)
Pt states numbness to fingers of right and left (right>left)

## 2015-08-12 NOTE — Discharge Instructions (Signed)
°Emergency Department Resource Guide °1) Find a Doctor and Pay Out of Pocket °Although you won't have to find out who is covered by your insurance plan, it is a good idea to ask around and get recommendations. You will then need to call the office and see if the doctor you have chosen will accept you as a new patient and what types of options they offer for patients who are self-pay. Some doctors offer discounts or will set up payment plans for their patients who do not have insurance, but you will need to ask so you aren't surprised when you get to your appointment. ° °2) Contact Your Local Health Department °Not all health departments have doctors that can see patients for sick visits, but many do, so it is worth a call to see if yours does. If you don't know where your local health department is, you can check in your phone book. The CDC also has a tool to help you locate your state's health department, and many state websites also have listings of all of their local health departments. ° °3) Find a Walk-in Clinic °If your illness is not likely to be very severe or complicated, you may want to try a walk in clinic. These are popping up all over the country in pharmacies, drugstores, and shopping centers. They're usually staffed by nurse practitioners or physician assistants that have been trained to treat common illnesses and complaints. They're usually fairly quick and inexpensive. However, if you have serious medical issues or chronic medical problems, these are probably not your best option. ° °No Primary Care Doctor: °- Call [facility] at  832-8000 - they can help you locate a primary care doctor that  accepts your insurance, provides certain services, etc. °- Physician Referral Service- 1-800-533-3463 ° °Chronic Pain Problems: °Organization         Address  Phone   Notes  °[facility]  ([phone]) [phone] Patients need to be referred by their primary care doctor.  ° °Medication  Assistance: °Organization         Address  Phone   Notes  °[place] [facility] [address] °[place], [place] [address] (336) 641-8030 --Must be a resident of [place] °-- Must have NO insurance coverage whatsoever (no Medicaid/ Medicare, etc.) °-- The pt. MUST have a primary care doctor that directs their care regularly and follows them in the community °  °[facility]  (866) 331-1348   °[employer]  (888) 892-1162   ° °Agencies that provide inexpensive medical care: °Organization         Address  Phone   Notes  °[facility]  (336) 832-8035   °[facility]    (336) 832-7272   °[facility] [address] °[place], [place] [address] (336) 832-4777   °[facility] [address], °[place] (336) 271-4999   °[facility]    (336) 373-0678   °[facility]    (336) 272-1050   °[facility] ° [address], [place] Phone:  (336) 832-4444, Fax:  (336) 832-4440 Hours of Operation:  9 am - 6 pm, M-F.  Also accepts Medicaid/Medicare and self-pay.  °[facility] ° [address], [place] Phone: (336) [phone], Fax: (336) [phone]. Hours of Operation:  8:30 am - 5:30 pm, M-F.  Also accepts Medicaid and self-pay.  °[facility] [place] [address], [place] Phone: (336) 878-6027   °[facility] [address], [place], [place] (336)723-1848, Ext. 123 Mondays & Thursdays: 7-9 AM.  First 15 patients are seen on a first come, first serve basis. °  ° °Medicaid-accepting [place] Providers: ° °Organization         Address  Phone   Notes  °[facility] [address], [place] (336) 641-2100 Also accepts self-pay patients.  °[facility] [address], [place] ° (336) 856-9996   °[facility] [address], [place]  (336) 288-8857   °[facility] [address] [place] [address], [place] (336) 299-7000   °Veita Bland [address], [place]  ° (336) 373-1557 Only accepts [place] Access Medicaid patients after they have their name applied to their card.  ° °Self-Pay (no insurance) in [place]: ° °Organization         Address  Phone   Notes  °Sickle Cell Patients, [facility] [address], [place] (336) 832-1970   °[facility] [address], [place] (336) 832-4400   °[facility] ° [address], [place] (336) 992-4800   °[facility]/Dr. Osei-Bonsu ° [address] [place] [address], [place] or [address], [place] (336) [phone] Phone number for both [place] and [place] locations is the same.  °[facility] [address], [place] (336) 299-0000   °[facility] [address] [place] [address], [place] or [address] (336) 852-7530 °(336) 878-2260   °[facility] [address], [place] (336) 350-1642, phone; (336) 294-5005, fax Sees patients 1st and 3rd Saturday of every month.  Must not qualify for public or private insurance (i.e. Medicaid, Medicare, [place] Health Choice, Veterans' Benefits) • Household income should be no more than 200% of the poverty level •The clinic cannot treat you if you are pregnant or think you are pregnant • Sexually transmitted diseases are not treated at the clinic.  ° ° °Dental Care: °Organization         Address  Phone  Notes  °[place] [facility] [address], [place] (336) 641-6152 Accepts children up to age 21 who are enrolled in Medicaid or [place] Health Choice; pregnant women with a Medicaid card; and children who have applied for Medicaid or [place] Health Choice, but were declined, whose parents can pay a reduced fee at time of service.  °[place]  [facility] [place]  [address], [place] (336) 641-7733 Accepts children up to age 21 who are enrolled in Medicaid or [place] Health Choice; pregnant women with a Medicaid card; and children who have applied for Medicaid or [place] Health Choice, but were declined, whose parents can pay a reduced fee at time of service.  °[facility] ° [address],  (336) 641-4533 Patients are seen by appointment only. Walk-ins are not accepted. [facility] will see patients 18 years of age and older. °Monday - Tuesday (8am-5pm) °Most Wednesdays (8:30-5pm) °$30 per visit, cash only  °[facility] ° [address], [place] (336) 641-4533 Patients are seen by appointment only. Walk-ins are not accepted. [facility] will see patients 18 years of age and older. °One   Wednesday Evening (Monthly: Volunteer Based).  $30 per visit, cash only  °[facility]  (919) [phone] for adults; Children under age 4, call Graduate Pediatric Dentistry at ([phone]) [phone]. Children aged 4-14, please call ([phone]) [phone] to request a pediatric application. ° Dental services are provided in all areas of dental care including fillings, crowns and bridges, complete and partial dentures, implants, gum treatment, root canals, and extractions. Preventive care is also provided. Treatment is provided to both adults and children. °Patients are selected via a lottery and there is often a waiting list. °  °[facility] [address], °[place] ° (336) 763-8833 [email or web address] °  °[facility] [address], [place], [place] (336)723-1848, Ext. 123 Second and Fourth Thursday of each month, opens at 6:30 AM; Clinic ends at 9 AM.  Patients are seen on a first-come first-served basis, and a limited number are seen during each clinic.  ° °[facility] ° [address], [place], [place] (336) [phone]    Eligibility Requirements °You must have lived in [place], [place], or [place] counties for at least the last three months. °  You cannot be eligible for state or federal sponsored healthcare insurance, including Veterans Administration, Medicaid, or Medicare. °  You generally cannot be eligible for healthcare insurance through your employer.  °  How to apply: °Eligibility screenings are held every Tuesday and Wednesday afternoon from 1:00 pm until 4:00 pm. You do not need an appointment for the interview!  °[facility] [address], [place], [place] 336-631-2330   °[facility]  336-342-8273   °[facility]  336-703-3100   °[facility]  336-570-6415   ° °Behavioral Health Resources in the Community: °Intensive Outpatient Programs °Organization         Address  Phone  Notes  °[facility] [address], [place], [place] 336-878-6098   °[facility] [address], [place], [place] 336-832-9800   °[facility] [address], [place], [place] ° 336-882-2125   °[facility] [address],  °[place], [place] 1-800-853-5163 or 336-641-4981   °Substance Abuse Resources °Organization         Address  Phone  Notes  °[facility]  336-882-2125   °[facility]  336-784-9470   °[facility]  336-285-9073   °[facility]  336-845-3988   °Residential & Outpatient Substance Abuse Program  1-800-659-3381   °Psychological Services °Organization         Address  Phone  Notes  °[facility]  336- 832-9600   °[facility]  336- 378-7881   °[facility] [address], [place] 1-800-853-5163 or 336-641-4981   ° °Mobile Crisis Teams °Organization         Address  Phone  Notes  °[facility], Mobile Crisis Care Unit  1-877-626-1772   °Assertive °[facility] ° [address]  [place], [place] 336-834-9664   °Sharon DeEsch [address] °[place] 336-554-5454   ° °Self-Help/Support Groups °Organization         Address  Phone             Notes  °[facility] [place] - variety of support groups  336- 373-1402 Call for more information  °Narcotics Anonymous (NA), [facility] [address], °[place]  2 meetings at this location  ° °  Residential Treatment Programs Organization         Address  Phone  Notes  [facility] [address],    [place]  1-(873)859-3230   [facility]  [address], [place] [id number], [place], [place]   [place], [place] [phone] Admissions: 8am-3pm M-F  [facility] [address],    [place], [place] [id number]   [facility] [address] [place], [place], [place]   [facility] [address],  [place], [place]   [facility] Intensive Outpatient [address], [place], [place]   [facility] [place]  [place], [place] 1-(573) 082-7350 or 930-464-0929   [facility] [address], [place], [place] Accepts Medicaid  [facility] [address],  [place] 1-775-004-1520 Substance Abuse/Addiction Treatment   [facility]         Address  Phone  Notes  [facility]  213-764-6327   Domenic Schwab, PhD [address] Arlis [place], [place]   (949) 358-9387 or 501-752-0974   [facility] [place] [place], [place] (620) 862-8521   [facility] [address] [place], [place] 606-220-2586 Insurance/Medicaid/sponsorship through [facility] [address]                                    [place], [place] ([phone] [place] [address], [place] ([phone]) [phone]    Dr. Adele Schilder  ([phone]   [facility] Dept. 1) [address], [place] 2) [place] 3)  [address], [place] [phone]  [phone]   [place] [phone] or [phone] (After Hours)      Take the prescription as directed.  Apply moist heat or ice to the area(s) of discomfort, for 15 minutes at a time, several times per day for the next few days.  Do not fall asleep on a heating or ice pack.  Call your regular medical doctor tomorrow to schedule a follow up appointment in the next 2 days.  Return to the Emergency Department immediately if worsening.
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
## 2015-08-12 NOTE — ED Notes (Signed)
Pt with shoulder, mid and upper back pain since Friday morning, states that she was in [facility] Thursday night

## 2015-08-16 ENCOUNTER — Ambulatory Visit (INDEPENDENT_AMBULATORY_CARE_PROVIDER_SITE_OTHER): Payer: Commercial Managed Care - HMO | Admitting: Physician Assistant

## 2015-08-16 ENCOUNTER — Encounter: Payer: Self-pay | Admitting: Physician Assistant

## 2015-08-16 VITALS — BP 132/72 | HR 88 | Temp 98.9°F | Resp 16 | Ht 68.0 in | Wt 262.0 lb

## 2015-08-16 DIAGNOSIS — G542 Cervical root disorders, not elsewhere classified: Secondary | ICD-10-CM | POA: Diagnosis not present

## 2015-08-16 DIAGNOSIS — M5090 Cervical disc disorder, unspecified, unspecified cervical region: Secondary | ICD-10-CM

## 2015-08-16 MED ORDER — OXYCODONE-ACETAMINOPHEN 5-325 MG PO TABS: 1.0000 | ORAL_TABLET | Freq: Three times a day (TID) | ORAL | Status: DC | PRN

## 2015-08-16 MED ORDER — METHOCARBAMOL 500 MG PO TABS: 1000.0000 mg | ORAL_TABLET | Freq: Four times a day (QID) | ORAL | Status: DC | PRN
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
## 2015-08-22 ENCOUNTER — Other Ambulatory Visit: Payer: Self-pay | Admitting: *Deleted

## 2015-08-22 MED ORDER — OMEPRAZOLE 20 MG PO CPDR: 20.0000 mg | DELAYED_RELEASE_CAPSULE | Freq: Two times a day (BID) | ORAL | Status: DC

## 2015-08-22 NOTE — Telephone Encounter (Signed)
Received fax from [facility] stating pt is requesting refill on OMEMPRAZOLE 20mg , pt last ov 08/14/15 refilled sent to pharmacy

## 2015-09-12 ENCOUNTER — Other Ambulatory Visit: Payer: Self-pay | Admitting: Physician Assistant

## 2015-09-12 DIAGNOSIS — M5090 Cervical disc disorder, unspecified, unspecified cervical region: Secondary | ICD-10-CM

## 2015-09-12 NOTE — Telephone Encounter (Signed)
CB# [phone]  Patient requesting refill on her oxyCODONE-acetaminophen (PERCOCET/ROXICET) 5-325 MG tablet

## 2015-09-12 NOTE — Telephone Encounter (Signed)
LRF 08/16/15 # 90.  LOV 08/16/15.  OK refill?

## 2015-09-13 NOTE — Telephone Encounter (Signed)
I ordered Referral to Pain Clinic 05/17/2015.  I just spoke with our Referral Coordinator to get update regarding this.  She states that pt was accepted to be seen at [facility] with Dr. Brandt Loosen they would treat with injections, but no narcotics. Pt declined .  Tell pt I am NOT going to continue to treat her pain.  She MUST f/u with Pain Clinic.

## 2015-09-14 NOTE — Telephone Encounter (Signed)
I am NOT prescribing any more pain meds. She can go to Pain Clinic.

## 2015-09-14 NOTE — Telephone Encounter (Signed)
She needs to F/u with pain clinic

## 2015-09-14 NOTE — Telephone Encounter (Signed)
Pt did have another pain management referral initiated.  (in the lost Epic files).  She has been accepted by the pain clinic at [facility].  I spoke to them today and gave them her new phone number.  They will be calling her today or Monday for appointment.  Is asking please for refill?

## 2015-09-14 NOTE — Telephone Encounter (Addendum)
Pt called this am stating she was confused as to why her pain med was DENIED by provider I explained to pt that she had to f/u with pain management and was referred to Dr. Brien Few at [facility] and pt stated that she did go for one visit and had injection and then told her she would have to have surgery and she is not ready for surgery at this time and wanted to know if we could send her someplace else. I sent referral to [facility] thru [facility]. Pt is requesting another provider to refill her pain medication in [facility] absence.  Pt would like to get pain medication until she sees pain clinic.   Please advise!

## 2015-09-17 NOTE — Telephone Encounter (Signed)
Pt aware that none of our providers will refill her medication.

## 2015-10-08 ENCOUNTER — Ambulatory Visit: Payer: Commercial Managed Care - HMO | Admitting: Anesthesiology

## 2015-10-11 ENCOUNTER — Ambulatory Visit: Payer: Commercial Managed Care - HMO | Admitting: Anesthesiology

## 2015-10-17 ENCOUNTER — Ambulatory Visit: Payer: Commercial Managed Care - HMO | Admitting: Physician Assistant

## 2015-10-24 ENCOUNTER — Ambulatory Visit (INDEPENDENT_AMBULATORY_CARE_PROVIDER_SITE_OTHER): Payer: Commercial Managed Care - HMO | Admitting: Physician Assistant

## 2015-10-24 ENCOUNTER — Encounter: Payer: Self-pay | Admitting: Physician Assistant

## 2015-10-24 ENCOUNTER — Other Ambulatory Visit: Payer: Self-pay | Admitting: Family Medicine

## 2015-10-24 VITALS — BP 116/76 | HR 60 | Temp 98.0°F | Resp 18 | Wt 267.0 lb

## 2015-10-24 DIAGNOSIS — F429 Obsessive-compulsive disorder, unspecified: Secondary | ICD-10-CM | POA: Diagnosis not present

## 2015-10-24 DIAGNOSIS — I10 Essential (primary) hypertension: Secondary | ICD-10-CM | POA: Diagnosis not present

## 2015-10-24 DIAGNOSIS — G4733 Obstructive sleep apnea (adult) (pediatric): Secondary | ICD-10-CM | POA: Diagnosis not present

## 2015-10-24 DIAGNOSIS — E1165 Type 2 diabetes mellitus with hyperglycemia: Secondary | ICD-10-CM

## 2015-10-24 DIAGNOSIS — E559 Vitamin D deficiency, unspecified: Secondary | ICD-10-CM

## 2015-10-24 DIAGNOSIS — Z800 Family history of malignant neoplasm of digestive organs: Secondary | ICD-10-CM | POA: Diagnosis not present

## 2015-10-24 DIAGNOSIS — G542 Cervical root disorders, not elsewhere classified: Secondary | ICD-10-CM

## 2015-10-24 DIAGNOSIS — F419 Anxiety disorder, unspecified: Secondary | ICD-10-CM | POA: Diagnosis not present

## 2015-10-24 DIAGNOSIS — K219 Gastro-esophageal reflux disease without esophagitis: Secondary | ICD-10-CM | POA: Diagnosis not present

## 2015-10-24 DIAGNOSIS — R809 Proteinuria, unspecified: Secondary | ICD-10-CM | POA: Diagnosis not present

## 2015-10-24 DIAGNOSIS — Z9989 Dependence on other enabling machines and devices: Secondary | ICD-10-CM

## 2015-10-24 DIAGNOSIS — M5090 Cervical disc disorder, unspecified, unspecified cervical region: Secondary | ICD-10-CM

## 2015-10-24 LAB — COMPLETE METABOLIC PANEL WITH GFR
ALT: 10 U/L (ref 6–29)
AST: 10 U/L (ref 10–35)
Albumin: 4 g/dL (ref 3.6–5.1)
Alkaline Phosphatase: 77 U/L (ref 33–115)
BUN: 13 mg/dL (ref 7–25)
CO2: 27 mmol/L (ref 20–31)
Calcium: 9.3 mg/dL (ref 8.6–10.2)
Chloride: 104 mmol/L (ref 98–110)
Creat: 1.27 mg/dL — ABNORMAL HIGH (ref 0.50–1.10)
GFR, Est African American: 58 mL/min — ABNORMAL LOW (ref >=60)
GFR, Est Non African American: 51 mL/min — ABNORMAL LOW (ref >=60)
Glucose, Bld: 110 mg/dL — ABNORMAL HIGH (ref 70–99)
Potassium: 4.1 mmol/L (ref 3.5–5.3)
Sodium: 140 mmol/L (ref 135–146)
Total Bilirubin: 0.4 mg/dL (ref 0.2–1.2)
Total Protein: 7 g/dL (ref 6.1–8.1)

## 2015-10-24 MED ORDER — FLUCONAZOLE 150 MG PO TABS: 150.0000 mg | ORAL_TABLET | Freq: Once | ORAL | Status: DC
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
## 2015-10-24 NOTE — Telephone Encounter (Signed)
Refill appropriate and filled per protocol. 

## 2015-10-25 LAB — HEMOGLOBIN A1C
Hgb A1c MFr Bld: 6.3 % — ABNORMAL HIGH (ref <5.7)
Mean Plasma Glucose: 134 mg/dL

## 2015-10-25 LAB — MICROALBUMIN, URINE: Microalb, Ur: 11.4 mg/dL

## 2015-10-30 ENCOUNTER — Encounter: Payer: Self-pay | Admitting: Family Medicine

## 2015-11-15 ENCOUNTER — Ambulatory Visit: Payer: Commercial Managed Care - HMO | Admitting: Nutrition

## 2015-12-20 ENCOUNTER — Ambulatory Visit: Payer: Commercial Managed Care - HMO | Admitting: Anesthesiology

## 2015-12-23 ENCOUNTER — Encounter: Payer: Self-pay | Admitting: Physician Assistant

## 2016-01-01 ENCOUNTER — Other Ambulatory Visit: Payer: Self-pay

## 2016-01-01 MED ORDER — OMEPRAZOLE 20 MG PO CPDR: 20.0000 mg | DELAYED_RELEASE_CAPSULE | Freq: Two times a day (BID) | ORAL | Status: DC

## 2016-01-18 ENCOUNTER — Other Ambulatory Visit: Payer: Self-pay | Admitting: Physician Assistant

## 2016-01-18 NOTE — Telephone Encounter (Signed)
Medication refilled per protocol. 

## 2016-04-07 ENCOUNTER — Telehealth: Payer: Self-pay | Admitting: Family Medicine

## 2016-04-07 MED ORDER — PERMETHRIN 5 % EX CREA: 1.0000 "application " | TOPICAL_CREAM | Freq: Once | CUTANEOUS | 0 refills | Status: AC

## 2016-04-07 NOTE — Telephone Encounter (Signed)
Husband here with rash, Pet died this weekend Wife also has itchy rash, felt like bites, Will treat both for scabies- permethrin sent

## 2016-04-17 ENCOUNTER — Ambulatory Visit (INDEPENDENT_AMBULATORY_CARE_PROVIDER_SITE_OTHER): Payer: Commercial Managed Care - HMO | Admitting: Physician Assistant

## 2016-04-17 ENCOUNTER — Encounter: Payer: Self-pay | Admitting: Physician Assistant

## 2016-04-17 VITALS — BP 110/86 | HR 65 | Temp 97.7°F | Resp 16 | Wt 260.0 lb

## 2016-04-17 DIAGNOSIS — Z9989 Dependence on other enabling machines and devices: Secondary | ICD-10-CM

## 2016-04-17 DIAGNOSIS — N183 Chronic kidney disease, stage 3 unspecified: Secondary | ICD-10-CM | POA: Insufficient documentation

## 2016-04-17 DIAGNOSIS — N186 End stage renal disease: Secondary | ICD-10-CM

## 2016-04-17 DIAGNOSIS — K219 Gastro-esophageal reflux disease without esophagitis: Secondary | ICD-10-CM | POA: Diagnosis not present

## 2016-04-17 DIAGNOSIS — G4733 Obstructive sleep apnea (adult) (pediatric): Secondary | ICD-10-CM

## 2016-04-17 DIAGNOSIS — F429 Obsessive-compulsive disorder, unspecified: Secondary | ICD-10-CM | POA: Diagnosis not present

## 2016-04-17 DIAGNOSIS — N189 Chronic kidney disease, unspecified: Secondary | ICD-10-CM | POA: Insufficient documentation

## 2016-04-17 DIAGNOSIS — N184 Chronic kidney disease, stage 4 (severe): Secondary | ICD-10-CM | POA: Insufficient documentation

## 2016-04-17 DIAGNOSIS — F419 Anxiety disorder, unspecified: Secondary | ICD-10-CM

## 2016-04-17 DIAGNOSIS — Z800 Family history of malignant neoplasm of digestive organs: Secondary | ICD-10-CM

## 2016-04-17 DIAGNOSIS — R809 Proteinuria, unspecified: Secondary | ICD-10-CM

## 2016-04-17 DIAGNOSIS — E1122 Type 2 diabetes mellitus with diabetic chronic kidney disease: Secondary | ICD-10-CM | POA: Diagnosis not present

## 2016-04-17 DIAGNOSIS — I10 Essential (primary) hypertension: Secondary | ICD-10-CM | POA: Diagnosis not present

## 2016-04-17 DIAGNOSIS — M5090 Cervical disc disorder, unspecified, unspecified cervical region: Secondary | ICD-10-CM

## 2016-04-17 DIAGNOSIS — Z992 Dependence on renal dialysis: Secondary | ICD-10-CM

## 2016-04-17 LAB — COMPLETE METABOLIC PANEL WITH GFR
ALT: 10 U/L (ref 6–29)
AST: 10 U/L (ref 10–35)
Albumin: 3.9 g/dL (ref 3.6–5.1)
Alkaline Phosphatase: 83 U/L (ref 33–115)
BUN: 14 mg/dL (ref 7–25)
CO2: 27 mmol/L (ref 20–31)
Calcium: 9.2 mg/dL (ref 8.6–10.2)
Chloride: 102 mmol/L (ref 98–110)
Creat: 1.18 mg/dL — ABNORMAL HIGH (ref 0.50–1.10)
GFR, Est African American: 63 mL/min (ref >=60)
GFR, Est Non African American: 55 mL/min — ABNORMAL LOW (ref >=60)
Glucose, Bld: 95 mg/dL (ref 70–99)
Potassium: 4.4 mmol/L (ref 3.5–5.3)
Sodium: 138 mmol/L (ref 135–146)
Total Bilirubin: 0.3 mg/dL (ref 0.2–1.2)
Total Protein: 6.9 g/dL (ref 6.1–8.1)

## 2016-04-17 LAB — LIPID PANEL
Cholesterol: 160 mg/dL (ref 125–200)
HDL: 49 mg/dL (ref >=46)
LDL Cholesterol: 72 mg/dL (ref <130)
Total CHOL/HDL Ratio: 3.3 Ratio (ref <=5.0)
Triglycerides: 193 mg/dL — ABNORMAL HIGH (ref <150)
VLDL: 39 mg/dL — ABNORMAL HIGH (ref <30)
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
## 2016-04-18 LAB — HEMOGLOBIN A1C
Hgb A1c MFr Bld: 5.8 % — ABNORMAL HIGH (ref <5.7)
Mean Plasma Glucose: 120 mg/dL

## 2016-04-30 ENCOUNTER — Other Ambulatory Visit: Payer: Self-pay | Admitting: Physician Assistant

## 2016-04-30 MED ORDER — FLUCONAZOLE 150 MG PO TABS: 150.0000 mg | ORAL_TABLET | Freq: Once | ORAL | 0 refills | Status: AC

## 2016-04-30 NOTE — Telephone Encounter (Signed)
Patient would like diflucan call in for yeast infection if possible, says that mb dixon has done this before

## 2016-04-30 NOTE — Telephone Encounter (Signed)
Approved. # 1 + 0.  If symptoms do not resolve , then come in for OV to evaluate.

## 2016-04-30 NOTE — Telephone Encounter (Signed)
RX refilled  

## 2016-04-30 NOTE — Telephone Encounter (Signed)
Ok to refill 

## 2016-05-07 ENCOUNTER — Ambulatory Visit (INDEPENDENT_AMBULATORY_CARE_PROVIDER_SITE_OTHER): Payer: Commercial Managed Care - HMO | Admitting: Family Medicine

## 2016-05-07 DIAGNOSIS — Z23 Encounter for immunization: Secondary | ICD-10-CM

## 2016-06-27 ENCOUNTER — Other Ambulatory Visit: Payer: Self-pay

## 2016-06-27 NOTE — Telephone Encounter (Signed)
Error

## 2016-07-02 ENCOUNTER — Ambulatory Visit (INDEPENDENT_AMBULATORY_CARE_PROVIDER_SITE_OTHER): Payer: Commercial Managed Care - HMO | Admitting: Physician Assistant

## 2016-07-02 ENCOUNTER — Encounter: Payer: Self-pay | Admitting: Physician Assistant

## 2016-07-02 VITALS — BP 110/82 | HR 54 | Temp 97.6°F | Resp 18 | Wt 268.0 lb

## 2016-07-02 DIAGNOSIS — R350 Frequency of micturition: Secondary | ICD-10-CM

## 2016-07-02 DIAGNOSIS — E119 Type 2 diabetes mellitus without complications: Secondary | ICD-10-CM

## 2016-07-02 LAB — URINALYSIS, ROUTINE W REFLEX MICROSCOPIC
Bilirubin Urine: NEGATIVE
Glucose, UA: NEGATIVE
Ketones, ur: NEGATIVE
Nitrite: NEGATIVE
Specific Gravity, Urine: 1.015 (ref 1.001–1.035)
pH: 5.5 (ref 5.0–8.0)

## 2016-07-02 LAB — URINALYSIS, MICROSCOPIC ONLY
Casts: NONE SEEN [LPF]
Crystals: NONE SEEN [HPF]
Yeast: NONE SEEN [HPF]

## 2016-07-02 NOTE — Progress Notes (Signed)
Patient ID: AHMANI DAOUD MRN: [id number], DOB: 05-11-1971, 45 y.o. Date of Encounter: 07/02/2016, 1:20 PM    Chief Complaint:  Chief Complaint  Patient presents with  . itching all over  . Back Pain  . Urinary Frequency    x2 wks  .          HPI: 45 y.o. year old female presents with above.   Says that last week she was waking up at night to urinate. Says she has been using some lemon water and that this problem has been better this past week.  Says that she has been itching across her upper chest.  Says that she does want to go ahead and follow-up with the diabetic educator. I had ordered referral for this in the past and she never followed up with the appointment but says that she would like to proceed with that.  No other complaints or concerns today.     Home Meds:   Outpatient Medications Prior to Visit  Medication Sig Dispense Refill  . acetaminophen (TYLENOL) 500 MG tablet Take 1,000 mg by mouth every 6 (six) hours as needed for mild pain.    . ARIPiprazole (ABILIFY) 5 MG tablet Take 5 mg by mouth daily.  3  . Cholecalciferol (VITAMIN D) 2000 UNITS CAPS Take 2,000 Units by mouth daily.     Marland Kitchen lamoTRIgine (LAMICTAL) 100 MG tablet Take 100 mg by mouth 2 (two) times daily.     Marland Kitchen lisinopril (PRINIVIL,ZESTRIL) 20 MG tablet TAKE 1 TABLET (20 MG TOTAL) BY MOUTH DAILY. 90 tablet 1  . meclizine (ANTIVERT) 25 MG tablet TAKE 1 TABLET TWICE A DAY AS NEEDED FOR DIZZINESS 60 tablet 0  . methocarbamol (ROBAXIN) 500 MG tablet Take 2 tablets (1,000 mg total) by mouth 4 (four) times daily as needed for muscle spasms (muscle spasm/pain). 90 tablet 0  . mometasone (NASONEX) 50 MCG/ACT nasal spray PLACE 2 SPRAYS INTO THE NOSE DAILY. 17 g 1  . omeprazole (PRILOSEC) 20 MG capsule Take 1 capsule (20 mg total) by mouth 2 (two) times daily. 180 capsule 3  . sertraline (ZOLOFT) 100 MG tablet Take 100 mg by mouth 2 (two) times daily.   3  . traZODone (DESYREL) 50 MG tablet Take 50 mg by  mouth at bedtime.    . clonazePAM (KLONOPIN) 0.5 MG tablet Take 0.5 mg by mouth daily as needed for anxiety. As needed  2  . lisdexamfetamine (VYVANSE) 30 MG capsule Take 30 mg by mouth daily. Reported on 10/24/2015    . Multiple Vitamin (MULTIVITAMIN) capsule Take 1 capsule by mouth daily.    Marland Kitchen oxyCODONE-acetaminophen (PERCOCET/ROXICET) 5-325 MG tablet Take 1 tablet by mouth every 8 (eight) hours as needed for severe pain. Must last 30 days (Patient not taking: Reported on 07/02/2016) 90 tablet 0   No facility-administered medications prior to visit.     Allergies:  Allergies  Allergen Reactions  . Diuretic [Buchu-Cornsilk-Ch Grass-Hydran] Anaphylaxis  . Ace Inhibitors Cough  . Codeine Itching  . Penicillins Hives and Itching  . Seroquel [Quetiapine] Other (See Comments)    Hypotension, alt mental status      Review of Systems: See HPI for pertinent ROS. All other ROS negative.    Physical Exam: Blood pressure 110/82, pulse (!) 54, temperature 97.6 F (36.4 C), temperature source Oral, resp. rate 18, weight 268 lb (121.6 kg), last menstrual period 06/18/2016, SpO2 98 %., Body mass index is 40.75 kg/m. General:  [name] Appears in  no acute distress. Neck: Supple. No thyromegaly. No lymphadenopathy. Lungs: Clear bilaterally to auscultation without wheezes, rales, or rhonchi. Breathing is unlabored. Heart: Regular rhythm. No murmurs, rubs, or gallops. Msk:  Strength and tone normal for age. Extremities/Skin: Warm and dry. There is no rash on her chest at the area that she says has been itchy. Neuro: Alert and oriented X 3. Moves all extremities spontaneously. Gait is normal. CNII-XII grossly in tact. Psych:  Responds to questions appropriately with a normal affect.   Results for orders placed or performed in visit on 07/02/16  Urinalysis, Routine w reflex microscopic  Result Value Ref Range   Color, Urine YELLOW YELLOW   APPearance CLEAR CLEAR   Specific Gravity, Urine 1.015  1.001 - 1.035   pH 5.5 5.0 - 8.0   Glucose, UA NEGATIVE NEGATIVE   Bilirubin Urine NEGATIVE NEGATIVE   Ketones, ur NEGATIVE NEGATIVE   Hgb urine dipstick 1+ (A) NEGATIVE   Protein, ur 1+ (A) NEGATIVE   Nitrite NEGATIVE NEGATIVE   Leukocytes, UA TRACE (A) NEGATIVE  Urine Microscopic  Result Value Ref Range   WBC, UA 0-5 <=5 WBC/HPF   RBC / HPF 0-2 <=2 RBC/HPF   Squamous Epithelial / LPF 0-5 <=5 HPF   Bacteria, UA MODERATE (A) NONE SEEN HPF   Crystals NONE SEEN NONE SEEN HPF   Casts NONE SEEN NONE SEEN LPF   Yeast NONE SEEN NONE SEEN HPF  \   ASSESSMENT AND PLAN:  45 y.o. year old female with  1. Frequent urination Re assured her that urinalysis does not show any signs of infection. As well she says that her symptoms have resolved. Told her to make sure not to be drinking liquids within several hours of going to bed at night. - Urinalysis, Routine w reflex microscopic  2. Controlled type 2 diabetes mellitus without complication, without long-term current use of insulin ([place]) I will go ahead and place another order for referral to diabetic educator. - Ambulatory referral to diabetic education  She states that her routine office visit is not due until later this month that has not been a full 3 months since her last routine office visit and labs. She plans to return for routine visit and labs at that time.  Signed, [address] [place], [place], [facility] 07/02/2016 1:20 PM

## 2016-07-07 ENCOUNTER — Telehealth: Payer: Self-pay | Admitting: Physician Assistant

## 2016-07-07 ENCOUNTER — Other Ambulatory Visit: Payer: Self-pay | Admitting: Physician Assistant

## 2016-07-07 ENCOUNTER — Telehealth: Payer: Self-pay

## 2016-07-07 MED ORDER — MECLIZINE HCL 25 MG PO TABS: ORAL_TABLET | ORAL | 0 refills | Status: DC

## 2016-07-07 NOTE — Telephone Encounter (Signed)
Patient called and requesting something for vertigo called into [facility] on [address]. She states she has been seen for this.  CB# [phone]

## 2016-07-07 NOTE — Telephone Encounter (Signed)
Pt stated she exp Sat dizziness and when getting up to go to the bathroom she fell over. Pt states she is not able to sit up and is req  a Rx be called in.  Offered for pt to make an appt she stated she is not able to sit up and that meclizine was called in for her in the past and is req that.  Pls advise

## 2016-07-07 NOTE — Telephone Encounter (Signed)
Pt aware rx was sent to pharmacy 

## 2016-07-07 NOTE — Telephone Encounter (Signed)
Meclizine sent to pharmacy on [address] as req

## 2016-07-07 NOTE — Telephone Encounter (Signed)
Patient called my ext as well mess has been sent back to [facility] waiting to hear back

## 2016-07-07 NOTE — Telephone Encounter (Signed)
If she is certain that she is having the same symptoms as the past then can use the meclizine. However, if symptoms are different or not controlled with the meclizine, then she needs evaluation/office visit. Can send prescription for meclizine 25 mg one by mouth every 4-6 hours as needed. #30+0 refill.

## 2016-07-14 ENCOUNTER — Other Ambulatory Visit: Payer: Self-pay | Admitting: Physician Assistant

## 2016-07-14 NOTE — Telephone Encounter (Signed)
Rx filled per protocol  

## 2016-07-23 ENCOUNTER — Ambulatory Visit: Payer: Commercial Managed Care - HMO | Admitting: Physician Assistant

## 2016-07-30 ENCOUNTER — Encounter: Payer: Self-pay | Admitting: Physician Assistant

## 2016-07-30 ENCOUNTER — Ambulatory Visit (INDEPENDENT_AMBULATORY_CARE_PROVIDER_SITE_OTHER): Payer: Commercial Managed Care - HMO | Admitting: Physician Assistant

## 2016-07-30 VITALS — BP 108/70 | HR 52 | Temp 98.6°F | Resp 14 | Ht 67.0 in | Wt 273.0 lb

## 2016-07-30 DIAGNOSIS — Z9989 Dependence on other enabling machines and devices: Secondary | ICD-10-CM | POA: Diagnosis not present

## 2016-07-30 DIAGNOSIS — E119 Type 2 diabetes mellitus without complications: Secondary | ICD-10-CM

## 2016-07-30 DIAGNOSIS — N183 Chronic kidney disease, stage 3 unspecified: Secondary | ICD-10-CM

## 2016-07-30 DIAGNOSIS — K219 Gastro-esophageal reflux disease without esophagitis: Secondary | ICD-10-CM | POA: Diagnosis not present

## 2016-07-30 DIAGNOSIS — G4733 Obstructive sleep apnea (adult) (pediatric): Secondary | ICD-10-CM

## 2016-07-30 DIAGNOSIS — I10 Essential (primary) hypertension: Secondary | ICD-10-CM | POA: Diagnosis not present

## 2016-07-30 LAB — HEMOGLOBIN A1C, FINGERSTICK: Hgb A1C (fingerstick): 6.3 % — ABNORMAL HIGH (ref <5.7)
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
## 2016-08-28 ENCOUNTER — Other Ambulatory Visit: Payer: Self-pay | Admitting: Family Medicine

## 2016-09-18 ENCOUNTER — Encounter: Payer: Self-pay | Admitting: Physician Assistant

## 2016-09-18 ENCOUNTER — Ambulatory Visit (INDEPENDENT_AMBULATORY_CARE_PROVIDER_SITE_OTHER): Payer: Commercial Managed Care - HMO | Admitting: Physician Assistant

## 2016-09-18 VITALS — BP 110/70 | HR 48 | Temp 98.0°F | Resp 16 | Wt 281.2 lb

## 2016-09-18 DIAGNOSIS — R0602 Shortness of breath: Secondary | ICD-10-CM | POA: Diagnosis not present

## 2016-09-18 DIAGNOSIS — M5090 Cervical disc disorder, unspecified, unspecified cervical region: Secondary | ICD-10-CM | POA: Diagnosis not present

## 2016-09-18 DIAGNOSIS — J988 Other specified respiratory disorders: Secondary | ICD-10-CM | POA: Diagnosis not present

## 2016-09-18 DIAGNOSIS — R001 Bradycardia, unspecified: Secondary | ICD-10-CM

## 2016-09-18 DIAGNOSIS — B9789 Other viral agents as the cause of diseases classified elsewhere: Secondary | ICD-10-CM

## 2016-09-18 DIAGNOSIS — G542 Cervical root disorders, not elsewhere classified: Secondary | ICD-10-CM
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
## 2016-10-01 ENCOUNTER — Encounter: Payer: Self-pay | Admitting: Cardiology

## 2016-10-09 ENCOUNTER — Encounter: Payer: Self-pay | Admitting: Cardiovascular Disease

## 2016-10-13 DIAGNOSIS — G4733 Obstructive sleep apnea (adult) (pediatric): Secondary | ICD-10-CM | POA: Diagnosis not present

## 2016-10-14 ENCOUNTER — Telehealth: Payer: Self-pay

## 2016-10-14 NOTE — Telephone Encounter (Signed)
Patient called requesting a Rx for yeast infection. We will not be able to call in a prescription without the patient being seen first.  Tried calling patient was told she was not home left message for her to return my call

## 2016-10-16 ENCOUNTER — Encounter: Payer: Self-pay | Admitting: Physician Assistant

## 2016-10-16 ENCOUNTER — Ambulatory Visit (INDEPENDENT_AMBULATORY_CARE_PROVIDER_SITE_OTHER): Payer: Commercial Managed Care - HMO | Admitting: Physician Assistant

## 2016-10-16 VITALS — BP 124/84 | HR 61 | Temp 97.6°F | Resp 16 | Wt 279.6 lb

## 2016-10-16 DIAGNOSIS — B3731 Acute candidiasis of vulva and vagina: Secondary | ICD-10-CM

## 2016-10-16 DIAGNOSIS — B373 Candidiasis of vulva and vagina: Secondary | ICD-10-CM

## 2016-10-16 DIAGNOSIS — R8290 Unspecified abnormal findings in urine: Secondary | ICD-10-CM

## 2016-10-16 DIAGNOSIS — N760 Acute vaginitis: Secondary | ICD-10-CM | POA: Diagnosis not present

## 2016-10-16 LAB — URINALYSIS, MICROSCOPIC ONLY
Casts: NONE SEEN [LPF]
Crystals: NONE SEEN [HPF]
Yeast: NONE SEEN [HPF]

## 2016-10-16 LAB — URINALYSIS, ROUTINE W REFLEX MICROSCOPIC
Bilirubin Urine: NEGATIVE
Glucose, UA: NEGATIVE
Ketones, ur: NEGATIVE
Leukocytes, UA: NEGATIVE
Nitrite: NEGATIVE
Specific Gravity, Urine: 1.02 (ref 1.001–1.035)
pH: 5.5 (ref 5.0–8.0)

## 2016-10-16 LAB — WET PREP FOR TRICH, YEAST, CLUE
Clue Cells Wet Prep HPF POC: NONE SEEN
Trich, Wet Prep: NONE SEEN

## 2016-10-16 MED ORDER — FLUCONAZOLE 150 MG PO TABS: 150.0000 mg | ORAL_TABLET | Freq: Once | ORAL | 0 refills | Status: AC

## 2016-10-16 MED ORDER — FLUCONAZOLE 150 MG PO TABS: 150.0000 mg | ORAL_TABLET | Freq: Once | ORAL | 0 refills | Status: DC

## 2016-10-16 NOTE — Progress Notes (Signed)
Patient ID: Cheryl Scott MRN: [id number], DOB: July 01, 1971, 46 y.o. Date of Encounter: 10/16/2016, 12:33 PM    Chief Complaint:  Chief Complaint  Patient presents with  .   .   . odor when urinating     HPI: 46 y.o. year old female that she has been noticing some odor when she urinates. Says that her vaginal area also feels irritated. Has noticed no actual skin lesions in that area just the odor and sensation of irritation. No other concerns to address today.     Home Meds:   Outpatient Medications Prior to Visit  Medication Sig Dispense Refill  . acetaminophen (TYLENOL) 500 MG tablet Take 1,000 mg by mouth every 6 (six) hours as needed for mild pain.    . ARIPiprazole (ABILIFY) 5 MG tablet Take 5 mg by mouth daily.  3  . Cholecalciferol (VITAMIN D) 2000 UNITS CAPS Take 2,000 Units by mouth daily.     . clonazePAM (KLONOPIN) 0.5 MG tablet Take 0.5 mg by mouth daily as needed for anxiety. As needed  2  . lisdexamfetamine (VYVANSE) 30 MG capsule Take 30 mg by mouth daily. Reported on 10/24/2015    . lisinopril (PRINIVIL,ZESTRIL) 20 MG tablet TAKE 1 TABLET (20 MG TOTAL) BY MOUTH DAILY. 90 tablet 1  . meclizine (ANTIVERT) 25 MG tablet TAKE 1 TABLET BY MOUTH TWICE A DAY AS NEEDED FOR DIZZINESS 60 tablet 0  . methocarbamol (ROBAXIN) 500 MG tablet Take 2 tablets (1,000 mg total) by mouth 4 (four) times daily as needed for muscle spasms (muscle spasm/pain). 90 tablet 0  . mometasone (NASONEX) 50 MCG/ACT nasal spray PLACE 2 SPRAYS INTO THE NOSE DAILY. 17 g 1  . Multiple Vitamin (MULTIVITAMIN) capsule Take 1 capsule by mouth daily.    Marland Kitchen omeprazole (PRILOSEC) 20 MG capsule Take 1 capsule (20 mg total) by mouth 2 (two) times daily. 180 capsule 3  . sertraline (ZOLOFT) 100 MG tablet Take 100 mg by mouth 2 (two) times daily.   3  . traZODone (DESYREL) 50 MG tablet Take 50 mg by mouth at bedtime.    . lamoTRIgine (LAMICTAL) 100 MG tablet Take 100 mg by mouth 2 (two) times daily.     Marland Kitchen  oxyCODONE-acetaminophen (PERCOCET/ROXICET) 5-325 MG tablet Take 1 tablet by mouth every 8 (eight) hours as needed for severe pain. Must last 30 days (Patient not taking: Reported on 07/30/2016) 90 tablet 0   No facility-administered medications prior to visit.     Allergies:  Allergies  Allergen Reactions  . Diuretic [Buchu-Cornsilk-Ch Grass-Hydran] Anaphylaxis  . Ace Inhibitors Cough  . Codeine Itching  . Penicillins Hives and Itching  . Seroquel [Quetiapine] Other (See Comments)    Hypotension, alt mental status      Review of Systems: See HPI for pertinent ROS. All other ROS negative.    Physical Exam: Blood pressure 124/84, pulse 61, temperature 97.6 F (36.4 C), temperature source Oral, resp. rate 16, weight 279 lb 9.6 oz (126.8 kg), last menstrual period 09/18/2016, SpO2 98 %., Body mass index is 43.79 kg/m. General:  [name]. Appears in no acute distress. Neck: Supple. No thyromegaly. No lymphadenopathy. Lungs: Clear bilaterally to auscultation without wheezes, rales, or rhonchi. Breathing is unlabored. Heart: Regular rhythm. No murmurs, rubs, or gallops. Msk:  Strength and tone normal for age. Pelvic Exam: External genitalia normal. Vaginal mucosa appears normal. Cervix appears normal. Small amount of creamy white discharge present. Bimanual exam normal with no mass or tenderness. Extremities/Skin:  Warm and dry.  Neuro: Alert and oriented X 3. Moves all extremities spontaneously. Gait is normal. CNII-XII grossly in tact. Psych:  Responds to questions appropriately with a normal affect.     ASSESSMENT AND PLAN:  46 y.o. year old female with  1. Candida vaginitis Wet prep does show some yeast. Shows no clue cells. Will treat with Diflucan. - fluconazole (DIFLUCAN) 150 MG tablet; Take 1 tablet (150 mg total) by mouth once.  Dispense: 1 tablet; Refill: 0  2. Acute vaginitis - GC/Chlamydia Probe Amp - WET PREP FOR TRICH, YEAST, CLUE  3. Abnormal urine odor Urinalysis  normal with no signs of urinary infection. - Urinalysis, Routine w reflex microscopic   Signed, [address] [place], [place], [facility] 10/16/2016 12:33 PM

## 2016-10-18 ENCOUNTER — Other Ambulatory Visit: Payer: Self-pay | Admitting: Physician Assistant

## 2016-10-18 LAB — GC/CHLAMYDIA PROBE AMP
CT Probe RNA: NOT DETECTED
GC Probe RNA: NOT DETECTED

## 2016-10-20 NOTE — Telephone Encounter (Signed)
Refill appropriate 

## 2016-10-23 ENCOUNTER — Telehealth: Payer: Self-pay

## 2016-10-23 DIAGNOSIS — R001 Bradycardia, unspecified: Secondary | ICD-10-CM

## 2016-10-23 NOTE — Telephone Encounter (Signed)
Patient called and states when she tried to make an appt with [facility] they told her that her referral had expired and that she needed another referral.Will put in new order for referral for cardiology

## 2016-11-12 ENCOUNTER — Encounter: Payer: Self-pay | Admitting: Cardiology
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
## 2016-11-13 ENCOUNTER — Encounter: Payer: Self-pay | Admitting: Cardiology

## 2016-11-13 ENCOUNTER — Ambulatory Visit (INDEPENDENT_AMBULATORY_CARE_PROVIDER_SITE_OTHER): Payer: Commercial Managed Care - HMO | Admitting: Cardiology

## 2016-11-13 VITALS — BP 118/80 | HR 56 | Ht 67.0 in | Wt 280.0 lb

## 2016-11-13 DIAGNOSIS — R0600 Dyspnea, unspecified: Secondary | ICD-10-CM

## 2016-11-13 DIAGNOSIS — R7303 Prediabetes: Secondary | ICD-10-CM | POA: Diagnosis not present

## 2016-11-13 DIAGNOSIS — Z8249 Family history of ischemic heart disease and other diseases of the circulatory system: Secondary | ICD-10-CM | POA: Diagnosis not present

## 2016-11-13 DIAGNOSIS — Z9989 Dependence on other enabling machines and devices: Secondary | ICD-10-CM

## 2016-11-13 DIAGNOSIS — I10 Essential (primary) hypertension: Secondary | ICD-10-CM

## 2016-11-13 DIAGNOSIS — R001 Bradycardia, unspecified: Secondary | ICD-10-CM

## 2016-11-13 DIAGNOSIS — R0609 Other forms of dyspnea: Secondary | ICD-10-CM | POA: Diagnosis not present

## 2016-11-13 DIAGNOSIS — G4733 Obstructive sleep apnea (adult) (pediatric): Secondary | ICD-10-CM | POA: Diagnosis not present

## 2016-11-13 NOTE — Patient Instructions (Signed)
Your physician recommends that you schedule a follow-up appointment in: to be determined, we will call you with results.    Your physician has recommended that you wear a 48 hr holter monitor. Holter monitors are medical devices that record the heart's electrical activity. Doctors most often use these monitors to diagnose arrhythmias. Arrhythmias are problems with the speed or rhythm of the heartbeat. The monitor is a small, portable device. You can wear one while you do your normal daily activities. This is usually used to diagnose what is causing palpitations/syncope (passing out).    Your physician has requested that you have en exercise stress Myoview (2 DAY STUDY). For further information please visit [email or web address]. Please follow instruction sheet, as given.     Your physician recommends that you continue on your current medications as directed. Please refer to the Current Medication list given to you today.     Thank you for choosing [facility] !

## 2016-11-17 ENCOUNTER — Ambulatory Visit (HOSPITAL_COMMUNITY)
Admission: RE | Admit: 2016-11-17 | Discharge: 2016-11-17 | Disposition: A | Discharge status: Home / Self Care | Payer: Commercial Managed Care - HMO | Source: Ambulatory Visit | Attending: Cardiology | Admitting: Cardiology

## 2016-11-17 DIAGNOSIS — R001 Bradycardia, unspecified: Secondary | ICD-10-CM

## 2016-11-19 ENCOUNTER — Encounter (HOSPITAL_BASED_OUTPATIENT_CLINIC_OR_DEPARTMENT_OTHER)
Admission: RE | Admit: 2016-11-19 | Discharge: 2016-11-19 | Disposition: A | Discharge status: Home / Self Care | Payer: Commercial Managed Care - HMO | Source: Ambulatory Visit | Attending: Cardiology | Admitting: Cardiology

## 2016-11-19 ENCOUNTER — Encounter (HOSPITAL_COMMUNITY)
Admission: RE | Admit: 2016-11-19 | Discharge: 2016-11-19 | Disposition: A | Discharge status: Home / Self Care | Payer: Commercial Managed Care - HMO | Source: Ambulatory Visit | Attending: Cardiology | Admitting: Cardiology

## 2016-11-19 ENCOUNTER — Encounter (HOSPITAL_COMMUNITY): Payer: Commercial Managed Care - HMO

## 2016-11-19 DIAGNOSIS — R0600 Dyspnea, unspecified: Secondary | ICD-10-CM

## 2016-11-19 DIAGNOSIS — R0609 Other forms of dyspnea: Secondary | ICD-10-CM | POA: Insufficient documentation

## 2016-11-19 MED ORDER — SODIUM CHLORIDE 0.9% FLUSH
INTRAVENOUS | Status: AC
  Administered 2016-11-19: 10 mL via INTRAVENOUS
  Filled 2016-11-19: qty 10

## 2016-11-19 MED ORDER — TECHNETIUM TC 99M TETROFOSMIN IV KIT
30.0000 | PACK | Freq: Once | INTRAVENOUS | Status: AC | PRN
  Administered 2016-11-19: 32 via INTRAVENOUS

## 2016-11-19 MED ORDER — REGADENOSON 0.4 MG/5ML IV SOLN
INTRAVENOUS | Status: AC
  Administered 2016-11-19: 0.4 mg via INTRAVENOUS
  Filled 2016-11-19: qty 5

## 2016-11-20 ENCOUNTER — Encounter (HOSPITAL_COMMUNITY): Payer: Self-pay

## 2016-11-20 ENCOUNTER — Encounter (HOSPITAL_COMMUNITY)
Admission: RE | Admit: 2016-11-20 | Discharge: 2016-11-20 | Disposition: A | Discharge status: Home / Self Care | Payer: Commercial Managed Care - HMO | Source: Ambulatory Visit | Attending: Cardiology | Admitting: Cardiology

## 2016-11-20 DIAGNOSIS — R0609 Other forms of dyspnea: Secondary | ICD-10-CM | POA: Diagnosis not present

## 2016-11-20 LAB — NM MYOCAR MULTI W/SPECT W/WALL MOTION / EF
Estimated workload: 7 METS
Exercise duration (min): 5 min
Exercise duration (sec): 27 s
LV dias vol: 80 mL (ref 46–106)
LV sys vol: 38 mL
MPHR: 173 {beats}/min
Peak HR: 133 {beats}/min
Percent HR: 76 %
RATE: 0.21
RPE: 16
Rest HR: 64 {beats}/min
SDS: 0
SRS: 0
SSS: 0
TID: 0.81

## 2016-11-20 MED ORDER — TECHNETIUM TC 99M TETROFOSMIN IV KIT
25.0000 | PACK | Freq: Once | INTRAVENOUS | Status: AC | PRN
  Administered 2016-11-20: 26 via INTRAVENOUS

## 2016-12-18 ENCOUNTER — Ambulatory Visit (INDEPENDENT_AMBULATORY_CARE_PROVIDER_SITE_OTHER): Payer: Commercial Managed Care - HMO | Admitting: Physician Assistant

## 2016-12-18 ENCOUNTER — Encounter: Payer: Self-pay | Admitting: Physician Assistant

## 2016-12-18 VITALS — BP 110/70 | HR 58 | Temp 97.6°F | Resp 14 | Wt 277.0 lb

## 2016-12-18 DIAGNOSIS — Z9989 Dependence on other enabling machines and devices: Secondary | ICD-10-CM

## 2016-12-18 DIAGNOSIS — F429 Obsessive-compulsive disorder, unspecified: Secondary | ICD-10-CM

## 2016-12-18 DIAGNOSIS — G4733 Obstructive sleep apnea (adult) (pediatric): Secondary | ICD-10-CM

## 2016-12-18 DIAGNOSIS — M5090 Cervical disc disorder, unspecified, unspecified cervical region: Secondary | ICD-10-CM

## 2016-12-18 DIAGNOSIS — G542 Cervical root disorders, not elsewhere classified: Secondary | ICD-10-CM | POA: Diagnosis not present

## 2016-12-18 DIAGNOSIS — N183 Chronic kidney disease, stage 3 unspecified: Secondary | ICD-10-CM

## 2016-12-18 DIAGNOSIS — I10 Essential (primary) hypertension: Secondary | ICD-10-CM

## 2016-12-18 DIAGNOSIS — K219 Gastro-esophageal reflux disease without esophagitis: Secondary | ICD-10-CM | POA: Diagnosis not present

## 2016-12-18 DIAGNOSIS — F419 Anxiety disorder, unspecified: Secondary | ICD-10-CM

## 2016-12-18 DIAGNOSIS — E119 Type 2 diabetes mellitus without complications: Secondary | ICD-10-CM | POA: Diagnosis not present

## 2016-12-18 LAB — BASIC METABOLIC PANEL WITH GFR
BUN: 15 mg/dL (ref 7–25)
CO2: 26 mmol/L (ref 20–31)
Calcium: 8.8 mg/dL (ref 8.6–10.2)
Chloride: 101 mmol/L (ref 98–110)
Creat: 1.25 mg/dL — ABNORMAL HIGH (ref 0.50–1.10)
GFR, Est African American: 59 mL/min — ABNORMAL LOW (ref >=60)
GFR, Est Non African American: 51 mL/min — ABNORMAL LOW (ref >=60)
Glucose, Bld: 108 mg/dL — ABNORMAL HIGH (ref 70–99)
Potassium: 4.1 mmol/L (ref 3.5–5.3)
Sodium: 137 mmol/L (ref 135–146)

## 2016-12-18 MED ORDER — CETIRIZINE HCL 10 MG PO TABS: 10.0000 mg | ORAL_TABLET | Freq: Every day | ORAL | 11 refills | Status: DC
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
## 2016-12-19 LAB — MICROALBUMIN, URINE: Microalb, Ur: 21.5 mg/dL

## 2016-12-19 LAB — HEMOGLOBIN A1C
Hgb A1c MFr Bld: 6.3 % — ABNORMAL HIGH (ref <5.7)
Mean Plasma Glucose: 134 mg/dL

## 2016-12-24 ENCOUNTER — Other Ambulatory Visit: Payer: Self-pay | Admitting: Physician Assistant

## 2016-12-25 NOTE — Telephone Encounter (Signed)
Refill appropriate 

## 2017-01-05 ENCOUNTER — Encounter: Payer: Self-pay | Admitting: Family Medicine

## 2017-01-05 ENCOUNTER — Ambulatory Visit (INDEPENDENT_AMBULATORY_CARE_PROVIDER_SITE_OTHER): Payer: Commercial Managed Care - HMO | Admitting: Family Medicine

## 2017-01-05 VITALS — BP 112/74 | HR 56 | Temp 98.6°F | Resp 14 | Ht 67.0 in | Wt 276.0 lb

## 2017-01-05 DIAGNOSIS — R21 Rash and other nonspecific skin eruption: Secondary | ICD-10-CM

## 2017-01-05 DIAGNOSIS — L299 Pruritus, unspecified: Secondary | ICD-10-CM

## 2017-01-05 DIAGNOSIS — E559 Vitamin D deficiency, unspecified: Secondary | ICD-10-CM | POA: Diagnosis not present

## 2017-01-05 DIAGNOSIS — I10 Essential (primary) hypertension: Secondary | ICD-10-CM | POA: Diagnosis not present

## 2017-01-05 DIAGNOSIS — E781 Pure hyperglyceridemia: Secondary | ICD-10-CM | POA: Diagnosis not present

## 2017-01-05 LAB — CBC WITH DIFFERENTIAL/PLATELET
Basophils Absolute: 0 cells/uL (ref 0–200)
Basophils Relative: 0 %
Eosinophils Absolute: 132 cells/uL (ref 15–500)
Eosinophils Relative: 2 %
HCT: 38 % (ref 35.0–45.0)
Hemoglobin: 11.9 g/dL — ABNORMAL LOW (ref 12.0–15.0)
Lymphocytes Relative: 27 %
Lymphs Abs: 1782 cells/uL (ref 850–3900)
MCH: 26.2 pg — ABNORMAL LOW (ref 27.0–33.0)
MCHC: 31.3 g/dL — ABNORMAL LOW (ref 32.0–36.0)
MCV: 83.7 fL (ref 80.0–100.0)
MPV: 9.2 fL (ref 7.5–12.5)
Monocytes Absolute: 330 cells/uL (ref 200–950)
Monocytes Relative: 5 %
Neutro Abs: 4356 cells/uL (ref 1500–7800)
Neutrophils Relative %: 66 %
Platelets: 266 10*3/uL (ref 140–400)
RBC: 4.54 MIL/uL (ref 3.80–5.10)
RDW: 15.5 % — ABNORMAL HIGH (ref 11.0–15.0)
WBC: 6.6 10*3/uL (ref 3.8–10.8)

## 2017-01-05 LAB — LIPID PANEL
Cholesterol: 154 mg/dL (ref <200)
HDL: 47 mg/dL — ABNORMAL LOW (ref >50)
LDL Cholesterol: 73 mg/dL (ref <100)
Total CHOL/HDL Ratio: 3.3 Ratio (ref <5.0)
Triglycerides: 171 mg/dL — ABNORMAL HIGH (ref <150)
VLDL: 34 mg/dL — ABNORMAL HIGH (ref <30)

## 2017-01-05 LAB — TSH: TSH: 2.58 mIU/L

## 2017-01-05 MED ORDER — HYDROXYZINE HCL 25 MG PO TABS: 25.0000 mg | ORAL_TABLET | Freq: Three times a day (TID) | ORAL | 0 refills | Status: DC | PRN

## 2017-01-05 MED ORDER — PERMETHRIN 5 % EX CREA: 1.0000 "application " | TOPICAL_CREAM | Freq: Once | CUTANEOUS | 0 refills | Status: AC

## 2017-01-05 NOTE — Patient Instructions (Addendum)
F/U as scheduled Use the cream  And itching medication

## 2017-01-05 NOTE — Progress Notes (Signed)
   Subjective:    Patient ID: Cheryl Scott, female    DOB: Nov 28, 1970, 46 y.o.   MRN: [id number]  Patient presents for Generalized Itching (x10 days- states that she has itching all over self- thinks she may have scabies)   Itching under her breast for a while, now has itching all over forpast 2 weeks. Has been visiting, father who is in long care facility concern for scabies  Would also like to have CBC and TSH drawn, was abnormal years, okay, only noted mild anemia in 2014. Has CPE coming up   Review Of Systems:  GEN- denies fatigue, fever, weight loss,weakness, recent illness HEENT- denies eye drainage, change in vision, nasal discharge, CVS- denies chest pain, palpitations RESP- denies SOB, cough, wheeze ABD- denies N/V, change in stools, abd pain GU- denies dysuria, hematuria, dribbling, incontinence MSK- denies joint pain, muscle aches, injury Neuro- denies headache, dizziness, syncope, seizure activity       Objective:    BP 112/74   Pulse (!) 56   Temp 98.6 F (37 C) (Oral)   Resp 14   Ht 5\' 7"  (1.702 m)   Wt 276 lb (125.2 kg)   LMP 12/08/2016 (Approximate)   SpO2 98%   BMI 43.23 kg/m  GEN- NAD, alert and oriented x3,well appearing HEENT- PERRL, EOMI, non injected sclera, pink conjunctiva, MMM, oropharynx clear Neck- Supple, no LAD CVS- RRR, no murmur RESP-CTAB Skin- in tact, few erythematous macuopaular lesions on right forearm, no burrow lines on palms, no other lesions, no hives  EXT- No edema Pulses- Radial 2+        Assessment & Plan:      Problem List Items Addressed This Visit    Vitamin D deficiency   Relevant Orders   Vitamin D, 25-hydroxy   Hypertriglyceridemia   Relevant Orders   Lipid panel   Hypertension (Chronic)    Controlled, check TSH, CBC      Relevant Orders   TSH    Other Visit Diagnoses    Skin rash    -  Primary   No lesions beneath breast, onluy isolated non specific spots on forearm, not classic of scabies? since  health care facility. Given atarax and permethrin   Relevant Orders   CBC with Differential/Platelet   Skin pruritus          Note: This dictation was prepared with Dragon dictation along with smaller phrase technology. Any transcriptional errors that result from this process are unintentional.

## 2017-01-05 NOTE — Assessment & Plan Note (Signed)
Controlled, check TSH, CBC

## 2017-01-06 LAB — VITAMIN D 25 HYDROXY (VIT D DEFICIENCY, FRACTURES): Vit D, 25-Hydroxy: 71 ng/mL (ref 30–100)

## 2017-01-12 DIAGNOSIS — G4733 Obstructive sleep apnea (adult) (pediatric): Secondary | ICD-10-CM | POA: Diagnosis not present

## 2017-01-16 ENCOUNTER — Other Ambulatory Visit: Payer: Self-pay | Admitting: Physician Assistant

## 2017-01-16 NOTE — Telephone Encounter (Signed)
Refill appropriate 

## 2017-01-19 ENCOUNTER — Ambulatory Visit (INDEPENDENT_AMBULATORY_CARE_PROVIDER_SITE_OTHER): Payer: 59 | Admitting: Physician Assistant

## 2017-01-19 ENCOUNTER — Encounter: Payer: Self-pay | Admitting: Physician Assistant

## 2017-01-19 VITALS — BP 102/80 | HR 60 | Temp 97.9°F | Resp 16 | Wt 278.4 lb

## 2017-01-19 DIAGNOSIS — Z0000 Encounter for general adult medical examination without abnormal findings: Secondary | ICD-10-CM | POA: Diagnosis not present
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
## 2017-01-31 ENCOUNTER — Other Ambulatory Visit: Payer: Self-pay | Admitting: Physician Assistant

## 2017-02-02 NOTE — Telephone Encounter (Signed)
Refill appropriate 

## 2017-02-08 ENCOUNTER — Other Ambulatory Visit: Payer: Self-pay | Admitting: Physician Assistant

## 2017-02-09 NOTE — Telephone Encounter (Signed)
Refill appropriate 

## 2017-03-13 ENCOUNTER — Telehealth: Payer: Self-pay

## 2017-03-13 ENCOUNTER — Other Ambulatory Visit: Payer: Self-pay | Admitting: Physician Assistant

## 2017-03-13 NOTE — Telephone Encounter (Signed)
Patient came in office today for a bee sting on left leg that happened 8/13. Location has turned red where sting was, patient instructed to take benadryl and use hydrocortisone cream to help with itching. Also apply ice pack for 5/10 mins every 1-2 hours Patient denies having shortness of breath, no swelling. Explained if leg swells up or she start to break out in hives to go to an UrgentCare over the weekend

## 2017-03-13 NOTE — Telephone Encounter (Signed)
Refill appropriate 

## 2017-03-19 ENCOUNTER — Ambulatory Visit (INDEPENDENT_AMBULATORY_CARE_PROVIDER_SITE_OTHER): Payer: Commercial Managed Care - HMO | Admitting: Physician Assistant

## 2017-03-19 ENCOUNTER — Encounter: Payer: Self-pay | Admitting: Physician Assistant

## 2017-03-19 VITALS — BP 108/80 | HR 60 | Temp 97.8°F | Resp 14 | Ht 67.0 in | Wt 277.0 lb

## 2017-03-19 DIAGNOSIS — N183 Chronic kidney disease, stage 3 unspecified: Secondary | ICD-10-CM

## 2017-03-19 DIAGNOSIS — G4733 Obstructive sleep apnea (adult) (pediatric): Secondary | ICD-10-CM

## 2017-03-19 DIAGNOSIS — E119 Type 2 diabetes mellitus without complications: Secondary | ICD-10-CM

## 2017-03-19 DIAGNOSIS — Z9989 Dependence on other enabling machines and devices: Secondary | ICD-10-CM

## 2017-03-19 DIAGNOSIS — E559 Vitamin D deficiency, unspecified: Secondary | ICD-10-CM

## 2017-03-19 DIAGNOSIS — E781 Pure hyperglyceridemia: Secondary | ICD-10-CM

## 2017-03-19 DIAGNOSIS — Z114 Encounter for screening for human immunodeficiency virus [HIV]: Secondary | ICD-10-CM | POA: Diagnosis not present

## 2017-03-19 DIAGNOSIS — F419 Anxiety disorder, unspecified: Secondary | ICD-10-CM | POA: Diagnosis not present

## 2017-03-19 DIAGNOSIS — K219 Gastro-esophageal reflux disease without esophagitis: Secondary | ICD-10-CM | POA: Diagnosis not present

## 2017-03-19 DIAGNOSIS — I10 Essential (primary) hypertension: Secondary | ICD-10-CM | POA: Diagnosis not present

## 2017-03-19 DIAGNOSIS — M5090 Cervical disc disorder, unspecified, unspecified cervical region: Secondary | ICD-10-CM

## 2017-03-19 DIAGNOSIS — F429 Obsessive-compulsive disorder, unspecified: Secondary | ICD-10-CM
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
## 2017-03-20 ENCOUNTER — Ambulatory Visit: Payer: Commercial Managed Care - HMO | Admitting: Family Medicine

## 2017-03-20 LAB — HIV ANTIBODY (ROUTINE TESTING W REFLEX): HIV 1&2 Ab, 4th Generation: NONREACTIVE

## 2017-03-20 LAB — HEMOGLOBIN A1C
Hgb A1c MFr Bld: 6.1 % — ABNORMAL HIGH (ref <5.7)
Mean Plasma Glucose: 128 mg/dL

## 2017-03-23 ENCOUNTER — Ambulatory Visit (INDEPENDENT_AMBULATORY_CARE_PROVIDER_SITE_OTHER): Payer: 59 | Admitting: Physician Assistant

## 2017-03-23 VITALS — BP 136/72 | HR 68 | Temp 98.1°F

## 2017-03-23 DIAGNOSIS — T63441A Toxic effect of venom of bees, accidental (unintentional), initial encounter: Secondary | ICD-10-CM | POA: Diagnosis not present

## 2017-03-23 MED ORDER — METHYLPREDNISOLONE ACETATE 80 MG/ML IJ SUSP
80.0000 mg | Freq: Once | INTRAMUSCULAR | Status: AC
  Administered 2017-03-23: 80 mg via INTRAMUSCULAR

## 2017-03-23 NOTE — Progress Notes (Signed)
Patient ID: Cheryl Scott MRN: [id number], DOB: [birth year], 46 y.o. Date of Encounter: 03/23/2017, 2:11 PM    Chief Complaint:  Chief Complaint  Patient presents with  . Bee Sting     HPI: 46 y.o. year old female presents with above.   She reports that they have 8 been asked near their door. She reports that this past Friday which was 03/20/17 baby stung her on her right forearm. The right forearm is still itchy and swollen so she came in for further evaluation/treatment. She has taken some oral Benadryl. Has used no other treatment.  She has had no difficulty breathing. Her throat has not felt itchy or tight. No other concerns to address today.     Home Meds:   Outpatient Medications Prior to Visit  Medication Sig Dispense Refill  . acetaminophen (TYLENOL) 500 MG tablet Take 1,000 mg by mouth every 6 (six) hours as needed for mild pain.    . ARIPiprazole (ABILIFY) 5 MG tablet Take 5 mg by mouth daily.  3  . cetirizine (ZYRTEC) 10 MG tablet Take 1 tablet (10 mg total) by mouth daily. 30 tablet 11  . Cholecalciferol (VITAMIN D) 2000 UNITS CAPS Take 2,000 Units by mouth daily.     . clonazePAM (KLONOPIN) 0.5 MG tablet Take 0.5 mg by mouth daily as needed for anxiety. As needed  2  . hydrOXYzine (ATARAX/VISTARIL) 25 MG tablet Take 1 tablet (25 mg total) by mouth 3 (three) times daily as needed. 20 tablet 0  . lamoTRIgine (LAMICTAL) 150 MG tablet Take 150 mg by mouth 2 (two) times daily.    Marland Kitchen lisdexamfetamine (VYVANSE) 30 MG capsule Take 30 mg by mouth daily. Reported on 10/24/2015    . lisinopril (PRINIVIL,ZESTRIL) 20 MG tablet TAKE 1 TABLET (20 MG TOTAL) BY MOUTH DAILY. 90 tablet 1  . meclizine (ANTIVERT) 25 MG tablet TAKE 1 TABLET EVERY 6 HOURS AS NEEDED VERTIGO 30 tablet 1  . methocarbamol (ROBAXIN) 500 MG tablet Take 2 tablets (1,000 mg total) by mouth 4 (four) times daily as needed for muscle spasms (muscle spasm/pain). 90 tablet 0  . mometasone (NASONEX) 50 MCG/ACT nasal  spray PLACE 2 SPRAYS INTO THE NOSE DAILY. 17 g 1  . Multiple Vitamin (MULTIVITAMIN) capsule Take 1 capsule by mouth daily.    Marland Kitchen omeprazole (PRILOSEC) 20 MG capsule TAKE 1 CAPSULE (20 MG TOTAL) BY MOUTH 2 (TWO) TIMES DAILY. 180 capsule 2  . sertraline (ZOLOFT) 100 MG tablet Take 100 mg by mouth 2 (two) times daily.   3  . traZODone (DESYREL) 50 MG tablet Take 50 mg by mouth at bedtime.     No facility-administered medications prior to visit.     Allergies:  Allergies  Allergen Reactions  . Diuretic [Buchu-Cornsilk-Ch Grass-Hydran] Anaphylaxis  . Ace Inhibitors Cough  . Codeine Itching  . Penicillins Hives and Itching  . Seroquel [Quetiapine] Other (See Comments)    Hypotension, alt mental status      Review of Systems: See HPI for pertinent ROS. All other ROS negative.    Physical Exam: Blood pressure 136/72, pulse 68, temperature 98.1 F (36.7 C), last menstrual period 03/09/2017, SpO2 97 %., There is no height or weight on file to calculate BMI. General:  [name]. Appears in no acute distress. Neck: Supple. No thyromegaly. No lymphadenopathy. Lungs: Clear bilaterally to auscultation without wheezes, rales, or rhonchi. Breathing is unlabored. Heart: Regular rhythm. No murmurs, rubs, or gallops. Msk:  Strength and tone normal for  age. Extremities/Skin: Right forearm: There is area of tiny vesicles. There is swelling of the right forearm and right hand. Neuro: Alert and oriented X 3. Moves all extremities spontaneously. Gait is normal. CNII-XII grossly in tact. Psych:  Responds to questions appropriately with a normal affect.     ASSESSMENT AND PLAN:  46 y.o. year old female with  1. Bee sting, accidental or unintentional, initial encounter Depo-Medrol 80 mg IM given here in the office. Discussed with patient that for many people prednisone causes him to feel kind of jittery and hyper and for some people they say that they feel kind of irritable and just "weird". Reassured  her that if she has these kinds of symptoms this is a known side effect and the should gradually wear off and improve over the next couple of days. Also discussed that this medication should help get rid of the itching and swelling. In addition, she is to continue taking oral Benadryl on a routine basis. Follow up if itching and swelling not resolving over the next few days. - methylPREDNISolone acetate (DEPO-MEDROL) injection 80 mg; Inject 1 mL (80 mg total) into the muscle once.   Marin Olp Marvin, [place], [facility] 03/23/2017 2:11 PM

## 2017-04-13 ENCOUNTER — Telehealth: Payer: Self-pay | Admitting: Physician Assistant

## 2017-04-13 DIAGNOSIS — G4733 Obstructive sleep apnea (adult) (pediatric): Secondary | ICD-10-CM | POA: Diagnosis not present

## 2017-04-13 NOTE — Telephone Encounter (Signed)
New Message  Pt voiced wanting to know if a referral can be put in for an ENT due to her complications.  Pt voiced wanting nurse to give her a call back.

## 2017-04-13 NOTE — Telephone Encounter (Signed)
This has not been evaluated recently. She had reported problems with this back in 2015 and since has had meclizine on hand to use as needed. Prior to a referral, she needs evaluation here.

## 2017-04-13 NOTE — Telephone Encounter (Signed)
SEE NOTE BELOW  Pt left vm stating her Vertigo had gotten worse, her ears are stopped up and the meclizine is no longer working PLS advise

## 2017-04-15 NOTE — Telephone Encounter (Signed)
Called patient left Vm to return call

## 2017-04-20 ENCOUNTER — Other Ambulatory Visit: Payer: Self-pay | Admitting: Physician Assistant

## 2017-05-05 NOTE — Telephone Encounter (Signed)
Patient in office.   Appointment scheduled for vertigo.

## 2017-05-06 ENCOUNTER — Encounter: Payer: Self-pay | Admitting: Physician Assistant

## 2017-05-06 ENCOUNTER — Ambulatory Visit (INDEPENDENT_AMBULATORY_CARE_PROVIDER_SITE_OTHER): Payer: 59 | Admitting: Physician Assistant

## 2017-05-06 VITALS — BP 132/70 | HR 60 | Temp 98.1°F | Resp 14 | Ht 67.0 in | Wt 277.0 lb

## 2017-05-06 DIAGNOSIS — H8110 Benign paroxysmal vertigo, unspecified ear: Secondary | ICD-10-CM | POA: Diagnosis not present

## 2017-05-06 DIAGNOSIS — Z23 Encounter for immunization: Secondary | ICD-10-CM

## 2017-05-06 NOTE — Progress Notes (Signed)
Patient ID: ADYSEN RAPHAEL MRN: [id number], DOB: [birth year], 46 y.o. Date of Encounter: 05/06/2017, 9:14 AM    Chief Complaint:  Chief Complaint  Patient presents with  . Vertigo  . Ear Issue    states that B ears ache and she has muffled hearing     HPI: 46 y.o. year old female presents with above.   She reports that this is been going on --off and on-- for about a year. Says that she thinks the first few times she had the symptoms was about a year ago. Ever since then has been off and on. Says that she will start feeling the symptoms and will take meclizine and BC sinus and the symptoms will improve for a while but then recur. Says that now recently she has been feeling a spinning sensation and also a staggery feeling "on a boat" sensation. Says that she hears some rumbling in both of her ears and has to ask people to repeat themselves-- feels like she is not hearing as well as usual. I asked if she is ever going to an ENT or any specialist about this. Says that she did go to one a few years ago now but does not remember the name of the doctor or the practice. Says that they were given a have her see somebody to do Apley maneuvers but it sounds like she must not of ever gone for that.  She states that she is not having any sinus pressure or sinus pain. Not a lot of nasal congestion is not blowing mucus out of her nose.     Home Meds:   Outpatient Medications Prior to Visit  Medication Sig Dispense Refill  . acetaminophen (TYLENOL) 500 MG tablet Take 1,000 mg by mouth every 6 (six) hours as needed for mild pain.    . ARIPiprazole (ABILIFY) 5 MG tablet Take 5 mg by mouth daily.  3  . cetirizine (ZYRTEC) 10 MG tablet Take 1 tablet (10 mg total) by mouth daily. 30 tablet 11  . Cholecalciferol (VITAMIN D) 2000 UNITS CAPS Take 2,000 Units by mouth daily.     . clonazePAM (KLONOPIN) 0.5 MG tablet Take 0.5 mg by mouth daily as needed for anxiety. As needed  2  . hydrOXYzine  (ATARAX/VISTARIL) 25 MG tablet Take 1 tablet (25 mg total) by mouth 3 (three) times daily as needed. 20 tablet 0  . lamoTRIgine (LAMICTAL) 150 MG tablet Take 150 mg by mouth 2 (two) times daily.    Marland Kitchen lisdexamfetamine (VYVANSE) 30 MG capsule Take 30 mg by mouth daily. Reported on 10/24/2015    . lisinopril (PRINIVIL,ZESTRIL) 20 MG tablet TAKE 1 TABLET (20 MG TOTAL) BY MOUTH DAILY. 90 tablet 1  . meclizine (ANTIVERT) 25 MG tablet TAKE 1 TABLET EVERY 6 HOURS AS NEEDED VERTIGO 30 tablet 1  . methocarbamol (ROBAXIN) 500 MG tablet Take 2 tablets (1,000 mg total) by mouth 4 (four) times daily as needed for muscle spasms (muscle spasm/pain). 90 tablet 0  . mometasone (NASONEX) 50 MCG/ACT nasal spray PLACE 2 SPRAYS INTO THE NOSE DAILY. 17 g 1  . Multiple Vitamin (MULTIVITAMIN) capsule Take 1 capsule by mouth daily.    Marland Kitchen omeprazole (PRILOSEC) 20 MG capsule TAKE 1 CAPSULE (20 MG TOTAL) BY MOUTH 2 (TWO) TIMES DAILY. 180 capsule 2  . sertraline (ZOLOFT) 100 MG tablet Take 100 mg by mouth 2 (two) times daily.   3  . traZODone (DESYREL) 50 MG tablet Take 50 mg by mouth  at bedtime.     No facility-administered medications prior to visit.     Allergies:  Allergies  Allergen Reactions  . Diuretic [Buchu-Cornsilk-Ch Grass-Hydran] Anaphylaxis  . Ace Inhibitors Cough  . Codeine Itching  . Penicillins Hives and Itching  . Seroquel [Quetiapine] Other (See Comments)    Hypotension, alt mental status      Review of Systems: See HPI for pertinent ROS. All other ROS negative.    Physical Exam: Blood pressure 132/70, pulse 60, temperature 98.1 F (36.7 C), temperature source Oral, resp. rate 14, height 5\' 7"  (1.702 m), weight 125.6 kg (277 lb), SpO2 97 %., Body mass index is 43.38 kg/m. General:  AAF. Appears in no acute distress. HEENT: Normocephalic, atraumatic, eyes without discharge, sclera non-icteric, nares are without discharge. Bilateral auditory canals clear, TM's are without perforation, pearly grey  and translucent with reflective cone of light bilaterally. Oral cavity moist, posterior pharynx without exudate, erythema, peritonsillar abscess. No tenderness with side percussion to frontal and maxillary sinuses bilaterally. [place] Maneuver--positive--does illicit vertigo and reproduces her symptoms.  Neck: Supple. No thyromegaly. No lymphadenopathy. Lungs: Clear bilaterally to auscultation without wheezes, rales, or rhonchi. Breathing is unlabored. Heart: Regular rhythm. No murmurs, rubs, or gallops. Msk:  Strength and tone normal for age. Extremities/Skin: Warm and dry. Neuro: Alert and oriented X 3. Moves all extremities spontaneously. Gait is normal. CNII-XII grossly in tact. Romberg normal. Psych:  Responds to questions appropriately with a normal affect.     ASSESSMENT AND PLAN:  46 y.o. year old female with  1. Benign paroxysmal positional vertigo, unspecified laterality I will refer to ENT. Needs Apley maneuvers. In the interim she is to move and change positions slowly to decrease symptoms. - Ambulatory referral to ENT   Signed, Karis Juba, PA, [facility] 05/06/2017 9:14 AM

## 2017-05-21 DIAGNOSIS — J343 Hypertrophy of nasal turbinates: Secondary | ICD-10-CM | POA: Insufficient documentation

## 2017-05-21 DIAGNOSIS — R42 Dizziness and giddiness: Secondary | ICD-10-CM | POA: Diagnosis not present

## 2017-05-21 DIAGNOSIS — J342 Deviated nasal septum: Secondary | ICD-10-CM | POA: Diagnosis not present

## 2017-05-21 DIAGNOSIS — Z0110 Encounter for examination of ears and hearing without abnormal findings: Secondary | ICD-10-CM | POA: Diagnosis not present

## 2017-06-10 ENCOUNTER — Ambulatory Visit (INDEPENDENT_AMBULATORY_CARE_PROVIDER_SITE_OTHER): Payer: 59 | Admitting: Physician Assistant

## 2017-06-10 ENCOUNTER — Encounter: Payer: Self-pay | Admitting: Physician Assistant

## 2017-06-10 ENCOUNTER — Other Ambulatory Visit: Payer: Self-pay

## 2017-06-10 VITALS — BP 132/84 | HR 77 | Temp 98.7°F | Resp 14 | Wt 273.2 lb

## 2017-06-10 DIAGNOSIS — K5900 Constipation, unspecified: Secondary | ICD-10-CM

## 2017-06-10 NOTE — Progress Notes (Signed)
Patient ID: ARLI BREE MRN: [id number], DOB: [birth year], 46 y.o. Date of Encounter: 06/10/2017, 4:12 PM    Chief Complaint:  Chief Complaint  Patient presents with  . Abdominal Pain    x3days  . Constipation     HPI: 46 y.o. year old female presents with above.   She reports that for the past 3 days she has had very little amount of stool/bowel movement.  She reports that she used an enema this morning and did have a small amount of stool after that.  The only other treatment she has used is "a laxative for kids "she used 1 dose of that yesterday and used 1 dose of that today.  She has used no other treatment for this.  She reports that her abdomen is feeling full.  Reports that she can feel stool moving around in her abdomen.  She has had no fevers or chills.  No localized abdominal pain.  No other concerns to address today.     Home Meds:   Outpatient Medications Prior to Visit  Medication Sig Dispense Refill  . acetaminophen (TYLENOL) 500 MG tablet Take 1,000 mg by mouth every 6 (six) hours as needed for mild pain.    . ARIPiprazole (ABILIFY) 5 MG tablet Take 5 mg by mouth daily.  3  . cetirizine (ZYRTEC) 10 MG tablet Take 1 tablet (10 mg total) by mouth daily. 30 tablet 11  . Cholecalciferol (VITAMIN D) 2000 UNITS CAPS Take 2,000 Units by mouth daily.     . clonazePAM (KLONOPIN) 0.5 MG tablet Take 0.5 mg by mouth daily as needed for anxiety. As needed  2  . hydrOXYzine (ATARAX/VISTARIL) 25 MG tablet Take 1 tablet (25 mg total) by mouth 3 (three) times daily as needed. 20 tablet 0  . lamoTRIgine (LAMICTAL) 150 MG tablet Take 150 mg by mouth 2 (two) times daily.    Marland Kitchen lisdexamfetamine (VYVANSE) 30 MG capsule Take 30 mg by mouth daily. Reported on 10/24/2015    . lisinopril (PRINIVIL,ZESTRIL) 20 MG tablet TAKE 1 TABLET (20 MG TOTAL) BY MOUTH DAILY. 90 tablet 1  . meclizine (ANTIVERT) 25 MG tablet TAKE 1 TABLET EVERY 6 HOURS AS NEEDED VERTIGO 30 tablet 1  . methocarbamol  (ROBAXIN) 500 MG tablet Take 2 tablets (1,000 mg total) by mouth 4 (four) times daily as needed for muscle spasms (muscle spasm/pain). 90 tablet 0  . mometasone (NASONEX) 50 MCG/ACT nasal spray PLACE 2 SPRAYS INTO THE NOSE DAILY. 17 g 1  . Multiple Vitamin (MULTIVITAMIN) capsule Take 1 capsule by mouth daily.    Marland Kitchen omeprazole (PRILOSEC) 20 MG capsule TAKE 1 CAPSULE (20 MG TOTAL) BY MOUTH 2 (TWO) TIMES DAILY. 180 capsule 2  . sertraline (ZOLOFT) 100 MG tablet Take 100 mg by mouth 2 (two) times daily.   3  . traZODone (DESYREL) 50 MG tablet Take 50 mg by mouth at bedtime.     No facility-administered medications prior to visit.     Allergies:  Allergies  Allergen Reactions  . Diuretic [Buchu-Cornsilk-Ch Grass-Hydran] Anaphylaxis  . Ace Inhibitors Cough  . Codeine Itching  . Penicillins Hives and Itching  . Seroquel [Quetiapine] Other (See Comments)    Hypotension, alt mental status      Review of Systems: See HPI for pertinent ROS. All other ROS negative.    Physical Exam: Blood pressure 132/84, pulse 77, temperature 98.7 F (37.1 C), temperature source Oral, resp. rate 14, weight 123.9 kg (273 lb 3.2 oz), last  menstrual period 05/10/2017, SpO2 98 %., Body mass index is 42.79 kg/m. General:  [name]. Appears in no acute distress. Neck: Supple. No thyromegaly. No lymphadenopathy. Lungs: Clear bilaterally to auscultation without wheezes, rales, or rhonchi. Breathing is unlabored. Heart: Regular rhythm. No murmurs, rubs, or gallops. Abdomen: Soft,  non-distended with normoactive bowel sounds. No hepatomegaly. No rebound/guarding. No obvious abdominal masses.  She reports tenderness with palpation throughout entire abdomen.  She reports no focal area of increased amount of pain. Msk:  Strength and tone normal for age. Extremities/Skin: Warm and dry. Neuro: Alert and oriented X 3. Moves all extremities spontaneously. Gait is normal. CNII-XII grossly in tact. Psych:  Responds to  questions appropriately with a normal affect.     ASSESSMENT AND PLAN:  46 y.o. year old female with  1. Constipation, unspecified constipation type I gave her 2 sample packets of Miralax----each packet contains one dose.  Told her to go home and use these 2 packets now.  Can mix with any type of liquid and make sure to drink plenty of water with it. Tomorrow morning she will use Linzess 1 every morning.  I gave her samples of Linzess 290 mcg-- gave her 2 bottles----each containing 4 pills--- total of 8 pills samples given. Follow-up if no bowel movement in the next 48 hours. Also on a daily basis/ long-term-- needs to drink plenty of water, eat plenty of vegetables and fiber to help prevent recurrent constipation.   Also then will determine whether she needs to continue daily Linzess and what dose based on response of current treatment above.  [address] [place], [place], [facility] 06/10/2017 4:12 PM

## 2017-06-22 DIAGNOSIS — N760 Acute vaginitis: Secondary | ICD-10-CM | POA: Diagnosis not present

## 2017-06-22 DIAGNOSIS — R05 Cough: Secondary | ICD-10-CM | POA: Diagnosis not present

## 2017-06-22 DIAGNOSIS — J019 Acute sinusitis, unspecified: Secondary | ICD-10-CM | POA: Diagnosis not present

## 2017-06-25 ENCOUNTER — Telehealth: Payer: Self-pay | Admitting: Physician Assistant

## 2017-06-25 DIAGNOSIS — K5909 Other constipation: Secondary | ICD-10-CM | POA: Insufficient documentation

## 2017-06-25 MED ORDER — LINACLOTIDE 290 MCG PO CAPS: 290.0000 ug | ORAL_CAPSULE | Freq: Every day | ORAL | 2 refills | Status: DC

## 2017-06-25 NOTE — Telephone Encounter (Signed)
Pt needs refill on linzess sent to [facility].

## 2017-06-25 NOTE — Telephone Encounter (Signed)
Linzess 290 mcg 1 po QAM # 90 +2. Have her come pick up a savings card. Add Chronic Constipation to Problem List.

## 2017-06-25 NOTE — Telephone Encounter (Signed)
rx filled sent to pharmacy

## 2017-06-25 NOTE — Telephone Encounter (Signed)
Patient is requesting an rx for linzess to help with constipation

## 2017-06-27 ENCOUNTER — Other Ambulatory Visit: Payer: Self-pay | Admitting: Physician Assistant

## 2017-06-29 NOTE — Telephone Encounter (Signed)
rx filled per protocol  

## 2017-07-13 DIAGNOSIS — G4733 Obstructive sleep apnea (adult) (pediatric): Secondary | ICD-10-CM | POA: Diagnosis not present

## 2017-08-19 DIAGNOSIS — Z6841 Body Mass Index (BMI) 40.0 and over, adult: Secondary | ICD-10-CM | POA: Diagnosis not present

## 2017-08-19 DIAGNOSIS — Z01419 Encounter for gynecological examination (general) (routine) without abnormal findings: Secondary | ICD-10-CM | POA: Diagnosis not present

## 2017-08-19 LAB — HM PAP SMEAR

## 2017-08-20 LAB — HM MAMMOGRAPHY

## 2017-09-23 ENCOUNTER — Encounter: Payer: Self-pay | Admitting: Physician Assistant

## 2017-09-23 ENCOUNTER — Ambulatory Visit (INDEPENDENT_AMBULATORY_CARE_PROVIDER_SITE_OTHER): Payer: 59 | Admitting: Physician Assistant

## 2017-09-23 ENCOUNTER — Other Ambulatory Visit: Payer: Self-pay

## 2017-09-23 VITALS — BP 128/84 | HR 59 | Temp 98.2°F | Resp 16 | Wt 279.6 lb

## 2017-09-23 DIAGNOSIS — I10 Essential (primary) hypertension: Secondary | ICD-10-CM

## 2017-09-23 DIAGNOSIS — E119 Type 2 diabetes mellitus without complications: Secondary | ICD-10-CM

## 2017-09-23 DIAGNOSIS — G4733 Obstructive sleep apnea (adult) (pediatric): Secondary | ICD-10-CM | POA: Diagnosis not present

## 2017-09-23 DIAGNOSIS — Z9989 Dependence on other enabling machines and devices: Secondary | ICD-10-CM

## 2017-09-23 DIAGNOSIS — N183 Chronic kidney disease, stage 3 unspecified: Secondary | ICD-10-CM

## 2017-09-23 DIAGNOSIS — K5909 Other constipation: Secondary | ICD-10-CM

## 2017-09-23 MED ORDER — LINACLOTIDE 72 MCG PO CAPS: 72.0000 ug | ORAL_CAPSULE | Freq: Every day | ORAL | 11 refills | Status: DC
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
## 2017-09-24 LAB — COMPLETE METABOLIC PANEL WITH GFR
AG Ratio: 1.4 (calc) (ref 1.0–2.5)
ALT: 14 U/L (ref 6–29)
AST: 12 U/L (ref 10–35)
Albumin: 4.2 g/dL (ref 3.6–5.1)
Alkaline phosphatase (APISO): 90 U/L (ref 33–115)
BUN/Creatinine Ratio: 11 (calc) (ref 6–22)
BUN: 13 mg/dL (ref 7–25)
CO2: 27 mmol/L (ref 20–32)
Calcium: 9.3 mg/dL (ref 8.6–10.2)
Chloride: 104 mmol/L (ref 98–110)
Creat: 1.15 mg/dL — ABNORMAL HIGH (ref 0.50–1.10)
GFR, Est African American: 65 mL/min/{1.73_m2} (ref >=60)
GFR, Est Non African American: 56 mL/min/{1.73_m2} — ABNORMAL LOW (ref >=60)
Globulin: 3.1 g/dL (calc) (ref 1.9–3.7)
Glucose, Bld: 111 mg/dL — ABNORMAL HIGH (ref 65–99)
Potassium: 4.3 mmol/L (ref 3.5–5.3)
Sodium: 139 mmol/L (ref 135–146)
Total Bilirubin: 0.3 mg/dL (ref 0.2–1.2)
Total Protein: 7.3 g/dL (ref 6.1–8.1)

## 2017-09-24 LAB — HEMOGLOBIN A1C
Hgb A1c MFr Bld: 6.5 % of total Hgb — ABNORMAL HIGH (ref <5.7)
Mean Plasma Glucose: 140 (calc)
eAG (mmol/L): 7.7 (calc)

## 2017-10-12 DIAGNOSIS — G4733 Obstructive sleep apnea (adult) (pediatric): Secondary | ICD-10-CM | POA: Diagnosis not present

## 2017-10-15 ENCOUNTER — Encounter: Payer: Self-pay | Admitting: Physician Assistant

## 2017-10-15 ENCOUNTER — Ambulatory Visit (INDEPENDENT_AMBULATORY_CARE_PROVIDER_SITE_OTHER): Payer: 59 | Admitting: Family Medicine

## 2017-10-15 ENCOUNTER — Encounter: Payer: Self-pay | Admitting: Family Medicine

## 2017-10-15 VITALS — BP 110/76 | HR 52 | Temp 98.2°F | Resp 16 | Ht 67.0 in | Wt 281.0 lb

## 2017-10-15 DIAGNOSIS — L03116 Cellulitis of left lower limb: Secondary | ICD-10-CM | POA: Diagnosis not present

## 2017-10-15 DIAGNOSIS — Z23 Encounter for immunization: Secondary | ICD-10-CM | POA: Diagnosis not present

## 2017-10-15 DIAGNOSIS — L02416 Cutaneous abscess of left lower limb: Secondary | ICD-10-CM | POA: Diagnosis not present

## 2017-10-15 MED ORDER — SULFAMETHOXAZOLE-TRIMETHOPRIM 800-160 MG PO TABS: 1.0000 | ORAL_TABLET | Freq: Two times a day (BID) | ORAL | 0 refills | Status: DC

## 2017-10-15 NOTE — Addendum Note (Signed)
Addended by: Shary Decamp B on: 10/15/2017 10:30 AM   Modules accepted: Orders
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
## 2017-10-19 ENCOUNTER — Encounter: Payer: Self-pay | Admitting: Physician Assistant

## 2017-10-20 ENCOUNTER — Other Ambulatory Visit: Payer: Self-pay

## 2017-10-20 ENCOUNTER — Encounter: Payer: Self-pay | Admitting: Family Medicine

## 2017-10-20 ENCOUNTER — Telehealth: Payer: Self-pay | Admitting: Family Medicine

## 2017-10-20 ENCOUNTER — Ambulatory Visit (INDEPENDENT_AMBULATORY_CARE_PROVIDER_SITE_OTHER): Payer: 59 | Admitting: Family Medicine

## 2017-10-20 VITALS — BP 118/82 | HR 61 | Temp 98.1°F | Resp 16 | Ht 68.0 in | Wt 278.0 lb

## 2017-10-20 DIAGNOSIS — T887XXA Unspecified adverse effect of drug or medicament, initial encounter: Secondary | ICD-10-CM | POA: Diagnosis not present

## 2017-10-20 DIAGNOSIS — J029 Acute pharyngitis, unspecified: Secondary | ICD-10-CM | POA: Diagnosis not present

## 2017-10-20 NOTE — Telephone Encounter (Signed)
Pt called last night states she felt like her throat was swelling, was not sure if it was due to the Sulfa antibiotics She did admit to some drainage  Currently on antibiotics due to cellulitis on leg  Advised to take benadryl, go to ER if difficulty breathing or problem worsened  do not take any more antibiotics until seen this AM

## 2017-10-20 NOTE — Progress Notes (Addendum)
Subjective:    Patient ID: Cheryl Scott, female    DOB: July 15, 1971, 47 y.o.   MRN: [id number]  Chief Complaint  Patient presents with  . throat felt like it was gonna close    started 4 days after staring antibiotic     HPI  Pt presents complaining of sensation of throat swelling and closing last night, felt like she "was trying to get strep throat" and a sensation that "something was in there."  Sx were worsened when she went to bed last night, she wore her CPAP, and she woke up several times feeling like she couldn't breath or her throat closed off.  She has associated new raspy voice and nasal discharge.  She believes her throat sx are related to recently starting Bactrim for cellulitis, rx'd on 10/15/17, last pill yesterday morning.  She called Dr. [name] overnight and she as advised to take benadryl and follow up today, and advised to go to the ER if sx worsened.  She felt benadryl did not improve her sx, but did make her itch.   Pt denied lip swelling, tongue swelling, hives, wheeze, SOB, cough, HA, N, V.  She was able to swallow solids and liquids without pain or choking.    Her left ankle abrasion and cellulitis has improved.  She no longer has redness or drainage.  The area has a scab, mild persistent swelling and is still mildly tender to palpation.       Review of Systems  Constitutional: Negative.  Negative for activity change, appetite change, chills, diaphoresis, fatigue and fever.  HENT: Positive for postnasal drip and voice change. Negative for facial swelling, sinus pressure, sinus pain, sore throat and trouble swallowing.   Eyes: Negative.  Negative for redness and itching.  Respiratory: Negative.  Negative for cough, choking, chest tightness, shortness of breath, wheezing and stridor.   Cardiovascular: Negative.  Negative for chest pain.  Gastrointestinal: Negative.   Musculoskeletal: Negative.   Skin: Negative.  Negative for color change and rash.  Neurological:  Negative.  Negative for dizziness, speech difficulty, light-headedness, numbness and headaches.  All other systems reviewed and are negative.      Objective:   Physical Exam  Constitutional: She appears well-developed and well-nourished. No distress.  HENT:  Head: Normocephalic and atraumatic. Head is without right periorbital erythema and without left periorbital erythema.  Right Ear: External ear normal.  Mouth/Throat: Uvula is midline and mucous membranes are normal. Mucous membranes are not pale and not dry. No oral lesions. No trismus in the jaw. No uvula swelling. No oropharyngeal exudate or tonsillar abscesses.  Posterior oropharynx mildly erythematous and edematous, uvula normal appearing w/o edema, uvula midline Nasal mucosa edematous and erythematous with clear discharge No swelling of lips No lingual edema   Eyes: Pupils are equal, round, and reactive to light. Conjunctivae and EOM are normal. Right eye exhibits no discharge. Left eye exhibits no discharge. No scleral icterus.  Neck: Normal range of motion. Neck supple. No tracheal deviation present.  Cardiovascular: Normal rate, regular rhythm and normal heart sounds. Exam reveals no gallop and no friction rub.  No murmur heard. Pulmonary/Chest: Effort normal and breath sounds normal. No stridor. No respiratory distress. She has no wheezes. She has no rales. She exhibits no tenderness.  Abdominal: Soft. Bowel sounds are normal. She exhibits no distension. There is no tenderness.  Musculoskeletal: Normal range of motion. She exhibits no edema.  Lymphadenopathy:    She has no cervical adenopathy.  Neurological:  She is alert. She exhibits normal muscle tone. Coordination normal.  Skin: Skin is warm and dry. No rash noted. She is not diaphoretic. No erythema.  Psychiatric: She has a normal mood and affect.          Assessment & Plan:    Encounter Diagnoses  Name Primary?  . Pharyngitis, unspecified etiology Yes  .  Medication side effect    Plan to resume taking her already prescribed cetirizine, add a cool mister, prop herself upright while using CPAP, f/up in office as needed, ER precautions discussed and pt verbalized understanding.   Suspect URI is etiology of pt's throat sx.  Allergic rxn to Bactrim is unlikely, pt had no other associated sx concerning for anaphylactic rxn or type 1 allergic reaction.    Did discontinue bactrim as a precaution.  Feel this is safe due to pt having no signs of cellulitis.  Delsa Grana, PA-C 10/21/17 4:05 PM

## 2017-10-20 NOTE — Patient Instructions (Addendum)
Resume taking cetirizine (ZYRTEC) 10 mg once nightly Add a humidifyer or cool mister to room at night Prop yourself up at night.  I suspect this is a viral syndrome and not a medication allergy, however if you have swelling of your lips, hives or swelling on face, start choking, coughing, wheezing, experience shortness of breath, call 911 or go to the nearest ER.   Upper Respiratory Infection, Adult Most upper respiratory infections (URIs) are caused by a virus. A URI affects the nose, throat, and upper air passages. The most common type of URI is often called "the common cold." Follow these instructions at home:  Take medicines only as told by your doctor.  Gargle warm saltwater or take cough drops to comfort your throat as told by your doctor.  Use a warm mist humidifier or inhale steam from a shower to increase air moisture. This may make it easier to breathe.  Drink enough fluid to keep your pee (urine) clear or pale yellow.  Eat soups and other clear broths.  Have a healthy diet.  Rest as needed.  Go back to work when your fever is gone or your doctor says it is okay. ? You may need to stay home longer to avoid giving your URI to others. ? You can also wear a face mask and wash your hands often to prevent spread of the virus.  Use your inhaler more if you have asthma.  Do not use any tobacco products, including cigarettes, chewing tobacco, or electronic cigarettes. If you need help quitting, ask your doctor. Contact a doctor if:  You are getting worse, not better.  Your symptoms are not helped by medicine.  You have chills.  You are getting more short of breath.  You have brown or red mucus.  You have yellow or brown discharge from your nose.  You have pain in your face, especially when you bend forward.  You have a fever.  You have puffy (swollen) neck glands.  You have pain while swallowing.  You have white areas in the back of your throat. Get help right  away if:  You have very bad or constant: ? Headache. ? Ear pain. ? Pain in your forehead, behind your eyes, and over your cheekbones (sinus pain). ? Chest pain.  You have long-lasting (chronic) lung disease and any of the following: ? Wheezing. ? Long-lasting cough. ? Coughing up blood. ? A change in your usual mucus.  You have a stiff neck.  You have changes in your: ? Vision. ? Hearing. ? Thinking. ? Mood. This information is not intended to replace advice given to you by your health care provider. Make sure you discuss any questions you have with your health care provider. Document Released: 12/31/2007 Document Revised: 03/16/2016 Document Reviewed: 10/19/2013 Elsevier Interactive Patient Education  2018 [employer].       Anaphylactic Reaction An anaphylactic reaction (anaphylaxis) is a sudden allergic reaction that is very bad (severe). It also affects more than one part of the body. This condition can be life-threatening. If you have an anaphylactic reaction, you need to get medical help right away. You may need to stay in the hospital. Your doctor may teach you how to use an allergy kit (anaphylaxis kit) and how to give yourself an allergy shot (epinephrine injection). You can give yourself an allergy shot with what is commonly called an auto-injector "pen." Symptoms of an anaphylactic reaction may include:  A stuffy nose (nasal congestion).  Headache.  Tingling  in your mouth.  A flushed face.  An itchy, red rash.  Swelling of your eyes, lips, face, or tongue.  Swelling of the back of your mouth and your throat.  Breathing loudly (wheezing).  A hoarse voice.  Itchy, red, swollen areas of skin (hives).  Dizziness or light-headedness.  Passing out (fainting).  Feeling worried or nervous (anxiety).  Feeling confused.  Pain in your belly (abdomen) or chest.  Trouble with breathing, talking, or swallowing.  A tight feeling in your chest or  throat.  Fast or uneven heartbeats (palpitations).  Throwing up (vomiting).  Watery poop (diarrhea).  Follow these instructions at home: Safety  Always keep an auto-injector pen or your allergy kit with you. These could save your life. Use them as told by your doctor.  Do not drive until your doctor says that it is safe.  Make sure that you, the people who live with you, and your employer know: ? How to use your allergy kit. ? How to use an auto-injector pen to give you an allergy shot.  If you used your auto-injector pen: ? Get more medicine for it right away. This is important in case you have another reaction. ? Get help right away.  Wear a bracelet or necklace that says you have an allergy, if your doctor tells you to do this.  Learn the signs of a very bad allergic reaction.  Work with your doctors to make a plan for what to do if you have a very bad allergic reaction. Being prepared is important. General instructions  Take over-the-counter and prescription medicines only as told by your doctor.  If you have itchy, red, swollen areas of skin or a rash: ? Use over-the-counter medicine (antihistamine) as told by your doctor. ? Put cold, wet cloths (cold compresses) on your skin. ? Take baths or showers in cool water. Avoid hot water.  If you had tests done, it is up to you to get your test results. Ask your doctor when your results will be ready.  Tell any doctors who care for you that you have an allergy.  Keep all follow-up visits as told by your doctor. This is important. How is this prevented?  Avoid things (allergens) that gave you a very bad allergic reaction before.  If you have a food allergy and you go to a restaurant, tell your server about your allergy. If you are not sure if your meal was made with food that you are allergic to, ask your server before you eat it. Contact a doctor if:  You have symptoms of an allergic reaction. You may notice them soon  after being around whatever it is that you are allergic to. Symptoms may include: ? A rash. ? A headache. ? Sneezing or a runny nose. ? Swelling. ? Feeling sick to your stomach. ? Watery poop. Get help right away if:  You had to use your auto-injector pen. You must go to the emergency room even if the medicine seems to be working.  You have any of these: ? A tight feeling in your chest or your throat. ? Loud breathing. ? Trouble with breathing. ? Itchy, red, swollen areas of skin. ? Red skin or itching all over your body. ? Swelling in your lips, tongue, or the back of your throat.  You have throwing up that gets very bad.  You have watery poop that gets very bad.  You pass out or feel like you might pass out. These symptoms  may be an emergency. Do not wait to see if the symptoms will go away. Use your auto-injector pen or allergy kit as you have been told. Get medical help right away. Call your local emergency services (911 in the [place] Do not drive yourself to the hospital. Summary  An anaphylactic reaction (anaphylaxis) is a sudden allergic reaction that is very bad (severe).  This condition can be life-threatening. If you have an anaphylactic reaction, you need to get medical help right away.  Your doctor may teach you how to use an allergy kit (anaphylaxis kit) and how to give yourself an allergy shot (epinephrine injection) with an auto-injector "pen."  Always keep an auto-injector pen or your allergy kit with you. These could save your life. Use them as told by your doctor.  If you had to use your auto-injector pen, you must go to the emergency room even if the medicine seems to be working. This information is not intended to replace advice given to you by your health care provider. Make sure you discuss any questions you have with your health care provider. Document Released: 12/31/2007 Document Revised: 03/07/2016 Document Reviewed: 03/07/2016 Elsevier Interactive  Patient Education  2017 [employer].

## 2017-10-21 ENCOUNTER — Encounter: Payer: Self-pay | Admitting: Family Medicine

## 2017-11-06 ENCOUNTER — Other Ambulatory Visit: Payer: Self-pay | Admitting: Physician Assistant

## 2017-11-23 ENCOUNTER — Encounter: Payer: Self-pay | Admitting: Physician Assistant

## 2017-12-09 ENCOUNTER — Other Ambulatory Visit: Payer: Self-pay | Admitting: Physician Assistant

## 2017-12-17 ENCOUNTER — Other Ambulatory Visit: Payer: Self-pay

## 2017-12-17 DIAGNOSIS — M5090 Cervical disc disorder, unspecified, unspecified cervical region: Secondary | ICD-10-CM

## 2017-12-17 MED ORDER — METHOCARBAMOL 500 MG PO TABS: 1000.0000 mg | ORAL_TABLET | Freq: Four times a day (QID) | ORAL | 0 refills | Status: DC | PRN

## 2017-12-25 ENCOUNTER — Other Ambulatory Visit: Payer: Self-pay | Admitting: Physician Assistant

## 2017-12-30 ENCOUNTER — Encounter (HOSPITAL_COMMUNITY): Payer: Self-pay

## 2017-12-30 ENCOUNTER — Emergency Department (HOSPITAL_COMMUNITY)
Admission: EM | Admit: 2017-12-30 | Discharge: 2017-12-30 | Disposition: A | Discharge status: Home / Self Care | Payer: 59 | Attending: Emergency Medicine | Admitting: Emergency Medicine

## 2017-12-30 ENCOUNTER — Other Ambulatory Visit: Payer: Self-pay

## 2017-12-30 DIAGNOSIS — J0100 Acute maxillary sinusitis, unspecified: Secondary | ICD-10-CM

## 2017-12-30 DIAGNOSIS — N183 Chronic kidney disease, stage 3 (moderate): Secondary | ICD-10-CM | POA: Insufficient documentation

## 2017-12-30 DIAGNOSIS — J3489 Other specified disorders of nose and nasal sinuses: Secondary | ICD-10-CM | POA: Diagnosis present

## 2017-12-30 DIAGNOSIS — E1122 Type 2 diabetes mellitus with diabetic chronic kidney disease: Secondary | ICD-10-CM | POA: Insufficient documentation

## 2017-12-30 DIAGNOSIS — H1131 Conjunctival hemorrhage, right eye: Secondary | ICD-10-CM | POA: Insufficient documentation

## 2017-12-30 DIAGNOSIS — R05 Cough: Secondary | ICD-10-CM | POA: Diagnosis not present

## 2017-12-30 DIAGNOSIS — I129 Hypertensive chronic kidney disease with stage 1 through stage 4 chronic kidney disease, or unspecified chronic kidney disease: Secondary | ICD-10-CM | POA: Insufficient documentation

## 2017-12-30 DIAGNOSIS — Z87891 Personal history of nicotine dependence: Secondary | ICD-10-CM | POA: Insufficient documentation

## 2017-12-30 MED ORDER — FLUCONAZOLE 200 MG PO TABS: 200.0000 mg | ORAL_TABLET | Freq: Once | ORAL | 1 refills | Status: AC

## 2017-12-30 MED ORDER — AZITHROMYCIN 250 MG PO TABS: 250.0000 mg | ORAL_TABLET | Freq: Every day | ORAL | 0 refills | Status: DC

## 2017-12-30 NOTE — Discharge Instructions (Addendum)
You were seen in the ED today with sinus pressure and concern for sinus infection. I am starting an antibiotic which should help but continue your allergy medications. Return to the ED with any new or worsening symptoms. You also have an area of redness in your right eye. This is not an infection and will resolve with time.

## 2017-12-30 NOTE — ED Triage Notes (Signed)
Pt reports sinus pressure x 3 or 4 days. Currently taking nasonex and zyrtec. Pt also reports redness to right eye (appears to be a subconjunctival hemorrhage) denies injury. Denies problems breathing, but reports being "winded more than normal".  Pt does report cough for about a month, now is non-productive and feels "like a tickle in throat".
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
## 2018-01-09 ENCOUNTER — Ambulatory Visit (HOSPITAL_COMMUNITY)
Admission: EM | Admit: 2018-01-09 | Discharge: 2018-01-09 | Disposition: A | Discharge status: Home / Self Care | Payer: 59 | Attending: Family Medicine | Admitting: Family Medicine

## 2018-01-09 ENCOUNTER — Encounter (HOSPITAL_COMMUNITY): Payer: Self-pay | Admitting: *Deleted

## 2018-01-09 DIAGNOSIS — Z760 Encounter for issue of repeat prescription: Secondary | ICD-10-CM

## 2018-01-09 DIAGNOSIS — I10 Essential (primary) hypertension: Secondary | ICD-10-CM

## 2018-01-09 LAB — POCT I-STAT, CHEM 8
BUN: 10 mg/dL (ref 6–20)
Calcium, Ion: 1.15 mmol/L (ref 1.15–1.40)
Chloride: 102 mmol/L (ref 101–111)
Creatinine, Ser: 1.4 mg/dL — ABNORMAL HIGH (ref 0.44–1.00)
Glucose, Bld: 91 mg/dL (ref 65–99)
HCT: 37 % (ref 36.0–46.0)
Hemoglobin: 12.6 g/dL (ref 12.0–15.0)
Potassium: 3.4 mmol/L — ABNORMAL LOW (ref 3.5–5.1)
Sodium: 140 mmol/L (ref 135–145)
TCO2: 26 mmol/L (ref 22–32)

## 2018-01-09 MED ORDER — LISINOPRIL 20 MG PO TABS: ORAL_TABLET | ORAL | 0 refills | Status: DC

## 2018-01-09 NOTE — ED Triage Notes (Signed)
Reports ran out of lisinopril 20mg  4 days ago.  C/O slight HA.

## 2018-01-09 NOTE — Discharge Instructions (Addendum)
Restart lisinopril, continue to monitor BP at home  Follow up with PCP  We are drawing your blood to check kidneys and potassium
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
## 2018-01-11 DIAGNOSIS — G4733 Obstructive sleep apnea (adult) (pediatric): Secondary | ICD-10-CM | POA: Diagnosis not present

## 2018-01-25 DIAGNOSIS — N920 Excessive and frequent menstruation with regular cycle: Secondary | ICD-10-CM | POA: Diagnosis not present

## 2018-01-25 DIAGNOSIS — N926 Irregular menstruation, unspecified: Secondary | ICD-10-CM | POA: Diagnosis not present

## 2018-02-10 ENCOUNTER — Ambulatory Visit (INDEPENDENT_AMBULATORY_CARE_PROVIDER_SITE_OTHER): Payer: 59 | Admitting: Physician Assistant

## 2018-02-10 ENCOUNTER — Other Ambulatory Visit: Payer: Self-pay

## 2018-02-10 ENCOUNTER — Encounter: Payer: Self-pay | Admitting: Physician Assistant

## 2018-02-10 VITALS — BP 130/88 | HR 49 | Temp 97.8°F | Resp 18 | Ht 66.25 in | Wt 270.0 lb

## 2018-02-10 DIAGNOSIS — N183 Chronic kidney disease, stage 3 unspecified: Secondary | ICD-10-CM

## 2018-02-10 DIAGNOSIS — Z0000 Encounter for general adult medical examination without abnormal findings: Secondary | ICD-10-CM

## 2018-02-10 DIAGNOSIS — I10 Essential (primary) hypertension: Secondary | ICD-10-CM | POA: Diagnosis not present

## 2018-02-10 DIAGNOSIS — E119 Type 2 diabetes mellitus without complications: Secondary | ICD-10-CM

## 2018-02-10 DIAGNOSIS — Z9989 Dependence on other enabling machines and devices: Secondary | ICD-10-CM

## 2018-02-10 DIAGNOSIS — E559 Vitamin D deficiency, unspecified: Secondary | ICD-10-CM

## 2018-02-10 DIAGNOSIS — G4733 Obstructive sleep apnea (adult) (pediatric): Secondary | ICD-10-CM | POA: Diagnosis not present

## 2018-02-10 DIAGNOSIS — K219 Gastro-esophageal reflux disease without esophagitis: Secondary | ICD-10-CM | POA: Diagnosis not present
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
## 2018-02-11 LAB — CBC WITH DIFFERENTIAL/PLATELET
Basophils Absolute: 22 cells/uL (ref 0–200)
Basophils Relative: 0.3 %
Eosinophils Absolute: 148 cells/uL (ref 15–500)
Eosinophils Relative: 2 %
HCT: 35.5 % (ref 35.0–45.0)
Hemoglobin: 11.5 g/dL — ABNORMAL LOW (ref 11.7–15.5)
Lymphs Abs: 1865 cells/uL (ref 850–3900)
MCH: 26.6 pg — ABNORMAL LOW (ref 27.0–33.0)
MCHC: 32.4 g/dL (ref 32.0–36.0)
MCV: 82.2 fL (ref 80.0–100.0)
MPV: 10.1 fL (ref 7.5–12.5)
Monocytes Relative: 5.1 %
Neutro Abs: 4988 cells/uL (ref 1500–7800)
Neutrophils Relative %: 67.4 %
Platelets: 265 10*3/uL (ref 140–400)
RBC: 4.32 10*6/uL (ref 3.80–5.10)
RDW: 14.4 % (ref 11.0–15.0)
Total Lymphocyte: 25.2 %
WBC mixed population: 377 cells/uL (ref 200–950)
WBC: 7.4 10*3/uL (ref 3.8–10.8)

## 2018-02-11 LAB — TSH: TSH: 3.87 mIU/L

## 2018-02-11 LAB — COMPLETE METABOLIC PANEL WITH GFR
AG Ratio: 1.4 (calc) (ref 1.0–2.5)
ALT: 8 U/L (ref 6–29)
AST: 10 U/L (ref 10–35)
Albumin: 4 g/dL (ref 3.6–5.1)
Alkaline phosphatase (APISO): 92 U/L (ref 33–115)
BUN/Creatinine Ratio: 10 (calc) (ref 6–22)
BUN: 13 mg/dL (ref 7–25)
CO2: 23 mmol/L (ref 20–32)
Calcium: 8.8 mg/dL (ref 8.6–10.2)
Chloride: 104 mmol/L (ref 98–110)
Creat: 1.32 mg/dL — ABNORMAL HIGH (ref 0.50–1.10)
GFR, Est African American: 55 mL/min/{1.73_m2} — ABNORMAL LOW (ref >=60)
GFR, Est Non African American: 47 mL/min/{1.73_m2} — ABNORMAL LOW (ref >=60)
Globulin: 2.8 g/dL (calc) (ref 1.9–3.7)
Glucose, Bld: 115 mg/dL — ABNORMAL HIGH (ref 65–99)
Potassium: 4.2 mmol/L (ref 3.5–5.3)
Sodium: 139 mmol/L (ref 135–146)
Total Bilirubin: 0.2 mg/dL (ref 0.2–1.2)
Total Protein: 6.8 g/dL (ref 6.1–8.1)

## 2018-02-11 LAB — HEMOGLOBIN A1C
Hgb A1c MFr Bld: 6.2 % of total Hgb — ABNORMAL HIGH (ref <5.7)
Mean Plasma Glucose: 131 (calc)
eAG (mmol/L): 7.3 (calc)

## 2018-02-11 LAB — MICROALBUMIN, URINE: Microalb, Ur: 148.1 mg/dL

## 2018-02-11 LAB — LIPID PANEL
Cholesterol: 161 mg/dL (ref <200)
HDL: 49 mg/dL — ABNORMAL LOW (ref >50)
LDL Cholesterol (Calc): 90 mg/dL (calc)
Non-HDL Cholesterol (Calc): 112 mg/dL (calc) (ref <130)
Total CHOL/HDL Ratio: 3.3 (calc) (ref <5.0)
Triglycerides: 121 mg/dL (ref <150)

## 2018-02-11 LAB — VITAMIN D 25 HYDROXY (VIT D DEFICIENCY, FRACTURES): Vit D, 25-Hydroxy: 27 ng/mL — ABNORMAL LOW (ref 30–100)

## 2018-02-16 ENCOUNTER — Other Ambulatory Visit: Payer: Self-pay | Admitting: Physician Assistant

## 2018-02-19 ENCOUNTER — Encounter: Payer: Self-pay | Admitting: Physician Assistant

## 2018-04-14 ENCOUNTER — Other Ambulatory Visit: Payer: Self-pay | Admitting: Physician Assistant

## 2018-04-14 DIAGNOSIS — G4733 Obstructive sleep apnea (adult) (pediatric): Secondary | ICD-10-CM | POA: Diagnosis not present

## 2018-04-16 DIAGNOSIS — Z23 Encounter for immunization: Secondary | ICD-10-CM | POA: Diagnosis not present

## 2018-04-19 ENCOUNTER — Other Ambulatory Visit: Payer: Self-pay | Admitting: Family Medicine

## 2018-05-06 ENCOUNTER — Encounter: Payer: Self-pay | Admitting: Gastroenterology

## 2018-07-02 ENCOUNTER — Other Ambulatory Visit: Payer: Self-pay | Admitting: Family Medicine

## 2018-07-10 ENCOUNTER — Emergency Department (HOSPITAL_COMMUNITY)
Admission: EM | Admit: 2018-07-10 | Discharge: 2018-07-10 | Disposition: A | Discharge status: Home / Self Care | Payer: 59 | Attending: Emergency Medicine | Admitting: Emergency Medicine

## 2018-07-10 ENCOUNTER — Other Ambulatory Visit: Payer: Self-pay

## 2018-07-10 ENCOUNTER — Encounter (HOSPITAL_COMMUNITY): Payer: Self-pay | Admitting: Emergency Medicine

## 2018-07-10 DIAGNOSIS — Z79899 Other long term (current) drug therapy: Secondary | ICD-10-CM | POA: Insufficient documentation

## 2018-07-10 DIAGNOSIS — I129 Hypertensive chronic kidney disease with stage 1 through stage 4 chronic kidney disease, or unspecified chronic kidney disease: Secondary | ICD-10-CM | POA: Insufficient documentation

## 2018-07-10 DIAGNOSIS — J029 Acute pharyngitis, unspecified: Secondary | ICD-10-CM | POA: Diagnosis present

## 2018-07-10 DIAGNOSIS — N183 Chronic kidney disease, stage 3 (moderate): Secondary | ICD-10-CM | POA: Diagnosis not present

## 2018-07-10 DIAGNOSIS — J069 Acute upper respiratory infection, unspecified: Secondary | ICD-10-CM

## 2018-07-10 DIAGNOSIS — Z87891 Personal history of nicotine dependence: Secondary | ICD-10-CM | POA: Insufficient documentation

## 2018-07-10 LAB — GROUP A STREP BY PCR: Group A Strep by PCR: NOT DETECTED

## 2018-07-10 MED ORDER — ONDANSETRON 4 MG PO TBDP
4.0000 mg | ORAL_TABLET | Freq: Once | ORAL | Status: AC
Start: 2018-07-10 — End: 2018-07-10
  Administered 2018-07-10: 4 mg via ORAL
  Filled 2018-07-10: qty 1

## 2018-07-10 MED ORDER — FLUCONAZOLE 100 MG PO TABS
200.0000 mg | ORAL_TABLET | Freq: Once | ORAL | Status: AC
  Administered 2018-07-10: 200 mg via ORAL
  Filled 2018-07-10: qty 2

## 2018-07-10 MED ORDER — PREDNISONE 20 MG PO TABS
40.0000 mg | ORAL_TABLET | Freq: Once | ORAL | Status: AC
  Administered 2018-07-10: 40 mg via ORAL
  Filled 2018-07-10: qty 2

## 2018-07-10 MED ORDER — DEXAMETHASONE 4 MG PO TABS: 4.0000 mg | ORAL_TABLET | Freq: Two times a day (BID) | ORAL | 0 refills | Status: DC

## 2018-07-10 MED ORDER — HYDROCODONE-ACETAMINOPHEN 5-325 MG PO TABS
1.0000 | ORAL_TABLET | ORAL | 0 refills | Status: DC | PRN
Start: 2018-07-10 — End: 2019-05-20

## 2018-07-10 MED ORDER — AZITHROMYCIN 250 MG PO TABS
500.0000 mg | ORAL_TABLET | Freq: Once | ORAL | Status: AC
Start: 2018-07-10 — End: 2018-07-10
  Administered 2018-07-10: 500 mg via ORAL
  Filled 2018-07-10: qty 2

## 2018-07-10 MED ORDER — IBUPROFEN 800 MG PO TABS: 800.0000 mg | ORAL_TABLET | Freq: Once | ORAL | Status: DC

## 2018-07-10 MED ORDER — AZITHROMYCIN 250 MG PO TABS: ORAL_TABLET | ORAL | 0 refills | Status: DC

## 2018-07-10 NOTE — ED Triage Notes (Signed)
Pt was exposed to known sick contact with same sx. Now is c/o sore throat for 2 days. Tylenol at home.

## 2018-07-10 NOTE — Discharge Instructions (Addendum)
Please use your mask until symptoms have resolved.  Use Tylenol extra strength every 4 hours for fever, or aching.  Use hydrocodone for more severe pain.This medication may cause drowsiness. Please do not drink, drive, or participate in activity that requires concentration while taking this medication.  Use Decadron 2 times daily.  And use Zithromax daily with food.  Salt water gargles and Chloraseptic Spray will be helpful.  Wash hands frequently.  Please increase fluids.  Please see your primary physician or return to the emergency department if not improving.
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
## 2018-07-13 DIAGNOSIS — G4733 Obstructive sleep apnea (adult) (pediatric): Secondary | ICD-10-CM | POA: Diagnosis not present

## 2018-07-26 ENCOUNTER — Telehealth: Payer: Self-pay | Admitting: Physician Assistant

## 2018-07-26 NOTE — Telephone Encounter (Signed)
Pt wants [place] to call in diflucan to [facility], she was prescribed antibiotic from the ER and now has yeast infection has already tried otc meds and it is not working.

## 2018-07-27 MED ORDER — FLUCONAZOLE 150 MG PO TABS: 150.0000 mg | ORAL_TABLET | ORAL | 0 refills | Status: DC | PRN

## 2018-07-27 NOTE — Telephone Encounter (Signed)
Pt given abx from ER visit, called here requesting diflucan for vaginal yeast infection sx secondary to abx [place], has tried and failed OTC meds. I reviewed her most recent labs from 01/2018, normal LFT's Diflucan sent to pharmacy with 2 doses to use PRN

## 2018-07-27 NOTE — Telephone Encounter (Signed)
Left message return call

## 2018-08-09 ENCOUNTER — Other Ambulatory Visit: Payer: Self-pay | Admitting: *Deleted

## 2018-08-09 MED ORDER — OMEPRAZOLE 20 MG PO CPDR: 20.0000 mg | DELAYED_RELEASE_CAPSULE | Freq: Two times a day (BID) | ORAL | 2 refills | Status: DC

## 2018-08-12 ENCOUNTER — Ambulatory Visit: Payer: 59 | Admitting: Physician Assistant

## 2018-08-17 ENCOUNTER — Other Ambulatory Visit: Payer: Self-pay | Admitting: Family Medicine

## 2018-08-17 NOTE — Telephone Encounter (Signed)
Pt called after hours line on Saturday, having vertigo, needed meclizine called in Refilled 20 tabs No vomiting, HA, no URI symptoms

## 2018-09-01 ENCOUNTER — Other Ambulatory Visit: Payer: 59

## 2018-09-01 ENCOUNTER — Other Ambulatory Visit: Payer: Self-pay

## 2018-09-01 ENCOUNTER — Encounter: Payer: Self-pay | Admitting: Family Medicine

## 2018-09-01 ENCOUNTER — Ambulatory Visit: Payer: 59 | Admitting: Family Medicine

## 2018-09-01 VITALS — BP 130/74 | HR 64 | Temp 98.2°F | Resp 14 | Ht 66.25 in | Wt 265.0 lb

## 2018-09-01 DIAGNOSIS — G4733 Obstructive sleep apnea (adult) (pediatric): Secondary | ICD-10-CM | POA: Diagnosis not present

## 2018-09-01 DIAGNOSIS — N183 Chronic kidney disease, stage 3 unspecified: Secondary | ICD-10-CM

## 2018-09-01 DIAGNOSIS — Z1211 Encounter for screening for malignant neoplasm of colon: Secondary | ICD-10-CM

## 2018-09-01 DIAGNOSIS — E119 Type 2 diabetes mellitus without complications: Secondary | ICD-10-CM | POA: Diagnosis not present

## 2018-09-01 DIAGNOSIS — I10 Essential (primary) hypertension: Secondary | ICD-10-CM | POA: Diagnosis not present

## 2018-09-01 DIAGNOSIS — M5090 Cervical disc disorder, unspecified, unspecified cervical region: Secondary | ICD-10-CM

## 2018-09-01 DIAGNOSIS — Z800 Family history of malignant neoplasm of digestive organs: Secondary | ICD-10-CM

## 2018-09-01 DIAGNOSIS — R6889 Other general symptoms and signs: Secondary | ICD-10-CM

## 2018-09-01 DIAGNOSIS — F429 Obsessive-compulsive disorder, unspecified: Secondary | ICD-10-CM

## 2018-09-01 DIAGNOSIS — Z9989 Dependence on other enabling machines and devices: Secondary | ICD-10-CM

## 2018-09-01 DIAGNOSIS — G2581 Restless legs syndrome: Secondary | ICD-10-CM

## 2018-09-01 NOTE — Progress Notes (Signed)
Subjective:    Patient ID: Cheryl Scott, female    DOB: Nov 17, 1970, 48 y.o.   MRN: [id number]  Patient presents for Follow-up (is fasting) and RLS (jittery, jerky movements of legs that she can't control- having issues going to sleep d/t movements)  Pt here to f/u chronic medical problems. Previous patient of my PA who has left the practice  She raising foster children now, which been stressful  MDD/OCD/Bipolar depression- [facility]- seen every 3 months, has appointment this afternoon. No recent changes to medications   Borderline DM- last A1C  6.2%, never been on medication     HTN- takes BP at home, no SE with medication   Chronic neck pain has DDD- takes hydrocodone on occ  Also on robaxin    Vertigo- uses meclizine as needed, she can sense when it is coming on   Diagnosed about 6 years no history of stroke    OSA- CPAP- uses [employer] for supplies    Hypertriglyceridemia- in past, no current medications     Bilat leg discomfoft, if she sitting long periods or in bed, has senesation move leg, will have to stop and stretch her legs.  Worse past few days, has had intermittantly over past 2 years    [facility] -GYN , has had PAP and Mammogram   [facility]thalmoogy- [facility]:  GEN- denies fatigue, fever, weight loss,weakness, recent illness HEENT- denies eye drainage, change in vision, nasal discharge, CVS- denies chest pain, palpitations RESP- denies SOB, cough, wheeze ABD- denies N/V, change in stools, abd pain GU- denies dysuria, hematuria, dribbling, incontinence MSK- + joint pain, muscle aches, injury Neuro- denies headache, dizziness, syncope, seizure activity       Objective:    BP 130/74   Pulse 64   Temp 98.2 F (36.8 C) (Oral)   Resp 14   Ht 5' 6.25" (1.683 m)   Wt 265 lb (120.2 kg)   SpO2 98%   BMI 42.45 kg/m  GEN- NAD, alert and oriented x3 HEENT- PERRL, EOMI, non injected  sclera, pink conjunctiva, MMM, oropharynx clear Neck- Supple, no thyromegaly CVS- RRR, no murmur RESP-CTAB ABD-NABS,soft,NT,ND Psych- normal affect and mood  EXT- No edema Pulses- Radial, DP- 2+        Assessment & Plan:      Problem List Items Addressed This Visit      Unprioritized   Cervical neck pain with evidence of disc disease    Prn norco and robaxinused      CKD (chronic kidney disease) stage 3, GFR 30-59 ml/min (HCC)   Relevant Orders   Comprehensive metabolic panel (Completed)   Diabetes mellitus type 2, controlled, without complications (HCC) - Primary    Check A1c , has been controlled Goal < 7%  But no medications ever given, ? If more pre- diabetic for years, will try to look back and see if any A1C over 6.5      Relevant Orders   CBC with Differential/Platelet (Completed)   Comprehensive metabolic panel (Completed)   Hemoglobin A1c (Completed)   Lipid panel (Completed)   FH: colon cancer   Relevant Orders   Ambulatory referral to Gastroenterology   Hypertension (Chronic)    Well controlled no changes       Relevant Orders   Lipid panel (Completed)   OCD (obsessive compulsive disorder) (Chronic)   OSA on CPAP (Chronic)    Continues to  benefit from CPAP Use       Other Visit Diagnoses    RLS (restless legs syndrome)       Will checklabs, may try B complex of Magnesium,Iron, prefer not to add requip with her other psych meds   Relevant Orders   Vitamin B12 (Completed)   Iron (Completed)   Other general symptoms and signs       Relevant Orders   Iron (Completed)   Colon cancer screening       Relevant Orders   Ambulatory referral to Gastroenterology      Note: This dictation was prepared with Dragon dictation along with smaller phrase technology. Any transcriptional errors that result from this process are unintentional.

## 2018-09-01 NOTE — Patient Instructions (Addendum)
Referral to GI for colonoscopy  F/U 4 months

## 2018-09-02 ENCOUNTER — Encounter: Payer: Self-pay | Admitting: Family Medicine

## 2018-09-02 ENCOUNTER — Telehealth: Payer: Self-pay | Admitting: *Deleted

## 2018-09-02 LAB — CBC WITH DIFFERENTIAL/PLATELET
Absolute Monocytes: 392 cells/uL (ref 200–950)
Basophils Absolute: 37 cells/uL (ref 0–200)
Basophils Relative: 0.5 %
Eosinophils Absolute: 259 cells/uL (ref 15–500)
Eosinophils Relative: 3.5 %
HCT: 37.9 % (ref 35.0–45.0)
Hemoglobin: 12.3 g/dL (ref 11.7–15.5)
Lymphs Abs: 2560 cells/uL (ref 850–3900)
MCH: 27 pg (ref 27.0–33.0)
MCHC: 32.5 g/dL (ref 32.0–36.0)
MCV: 83.1 fL (ref 80.0–100.0)
MPV: 9.3 fL (ref 7.5–12.5)
Monocytes Relative: 5.3 %
Neutro Abs: 4151 cells/uL (ref 1500–7800)
Neutrophils Relative %: 56.1 %
Platelets: 318 10*3/uL (ref 140–400)
RBC: 4.56 10*6/uL (ref 3.80–5.10)
RDW: 14.2 % (ref 11.0–15.0)
Total Lymphocyte: 34.6 %
WBC: 7.4 10*3/uL (ref 3.8–10.8)

## 2018-09-02 LAB — COMPREHENSIVE METABOLIC PANEL
AG Ratio: 1.3 (calc) (ref 1.0–2.5)
ALT: 15 U/L (ref 6–29)
AST: 15 U/L (ref 10–35)
Albumin: 4.3 g/dL (ref 3.6–5.1)
Alkaline phosphatase (APISO): 97 U/L (ref 31–125)
BUN/Creatinine Ratio: 12 (calc) (ref 6–22)
BUN: 17 mg/dL (ref 7–25)
CO2: 27 mmol/L (ref 20–32)
Calcium: 9.6 mg/dL (ref 8.6–10.2)
Chloride: 102 mmol/L (ref 98–110)
Creat: 1.43 mg/dL — ABNORMAL HIGH (ref 0.50–1.10)
Globulin: 3.4 g/dL (calc) (ref 1.9–3.7)
Glucose, Bld: 103 mg/dL — ABNORMAL HIGH (ref 65–99)
Potassium: 4.3 mmol/L (ref 3.5–5.3)
Sodium: 140 mmol/L (ref 135–146)
Total Bilirubin: 0.3 mg/dL (ref 0.2–1.2)
Total Protein: 7.7 g/dL (ref 6.1–8.1)

## 2018-09-02 LAB — LIPID PANEL
Cholesterol: 156 mg/dL (ref <200)
HDL: 50 mg/dL (ref >=50)
LDL Cholesterol (Calc): 83 mg/dL (calc)
Non-HDL Cholesterol (Calc): 106 mg/dL (calc) (ref <130)
Total CHOL/HDL Ratio: 3.1 (calc) (ref <5.0)
Triglycerides: 126 mg/dL (ref <150)

## 2018-09-02 LAB — HEMOGLOBIN A1C
Hgb A1c MFr Bld: 6.1 % of total Hgb — ABNORMAL HIGH (ref <5.7)
Mean Plasma Glucose: 128 (calc)
eAG (mmol/L): 7.1 (calc)

## 2018-09-02 LAB — IRON: Iron: 77 ug/dL (ref 40–190)

## 2018-09-02 LAB — VITAMIN B12: Vitamin B-12: 1014 pg/mL (ref 200–1100)

## 2018-09-02 NOTE — Assessment & Plan Note (Addendum)
Check A1c , has been controlled Goal < 7%  But no medications ever given, ? If more pre- diabetic for years, will try to look back and see if any A1C over 6.5

## 2018-09-02 NOTE — Assessment & Plan Note (Signed)
Well controlled no changes 

## 2018-09-02 NOTE — Telephone Encounter (Signed)
Received call from patient.   Inquired as to if it will be safe to take magnesium in addition to her reoutine medications for RLS.   MD please advise.

## 2018-09-02 NOTE — Telephone Encounter (Signed)
Will place recommendations on her lab results

## 2018-09-02 NOTE — Assessment & Plan Note (Signed)
Continues to benefit from CPAP Use

## 2018-09-02 NOTE — Assessment & Plan Note (Signed)
Prn norco and robaxinused

## 2018-09-03 LAB — HM DIABETES EYE EXAM

## 2018-09-06 ENCOUNTER — Other Ambulatory Visit: Payer: Self-pay | Admitting: *Deleted

## 2018-09-06 ENCOUNTER — Encounter: Payer: Self-pay | Admitting: *Deleted

## 2018-09-06 MED ORDER — MECLIZINE HCL 25 MG PO TABS: 25.0000 mg | ORAL_TABLET | Freq: Three times a day (TID) | ORAL | 0 refills | Status: DC | PRN

## 2018-09-07 ENCOUNTER — Encounter: Payer: Self-pay | Admitting: *Deleted

## 2018-09-13 ENCOUNTER — Encounter: Payer: Self-pay | Admitting: Gastroenterology

## 2018-09-15 ENCOUNTER — Telehealth: Payer: Self-pay | Admitting: *Deleted

## 2018-09-15 DIAGNOSIS — M5090 Cervical disc disorder, unspecified, unspecified cervical region: Secondary | ICD-10-CM

## 2018-09-15 NOTE — Telephone Encounter (Signed)
Received call from patient.   Reports that she has been having increased back pain and robaxin is not helping. States that she tries not to take Hydrocodone/APAP often as she is afraid of addiction.   Inquired as to if there is any other type of medication she could use.   MD please advise.

## 2018-09-15 NOTE — Telephone Encounter (Signed)
Call placed to patient and patient made aware.   States that she will continue to try the APAP with Robaxin.   Requested PT referral. Orders placed.

## 2018-09-15 NOTE — Telephone Encounter (Signed)
I dont recommend the NSAIDS due to her CKD She can take regular EX tylenol with the robaxin She can also be referred to PT Topicals OTC can be used safely

## 2018-09-28 ENCOUNTER — Ambulatory Visit: Payer: 59 | Attending: Family Medicine | Admitting: Physical Therapy

## 2018-10-09 ENCOUNTER — Encounter: Payer: Self-pay | Admitting: Family Medicine

## 2018-10-09 ENCOUNTER — Other Ambulatory Visit: Payer: Self-pay | Admitting: Family Medicine

## 2018-10-09 NOTE — Progress Notes (Signed)
Pt called out of meds and no refills for lisinopril. I verified with chart that she had f/up visit last month with Dr. [name]. Will send in refill on lisinopril and meclizine. Pt encouraged to call office during normal business hours in advance (72 hour for routine prescription refills.

## 2018-10-11 DIAGNOSIS — G4733 Obstructive sleep apnea (adult) (pediatric): Secondary | ICD-10-CM | POA: Diagnosis not present

## 2018-10-15 ENCOUNTER — Telehealth: Payer: Self-pay | Admitting: Family Medicine

## 2018-10-15 ENCOUNTER — Other Ambulatory Visit: Payer: Self-pay | Admitting: Family Medicine

## 2018-10-15 MED ORDER — LISINOPRIL 20 MG PO TABS: ORAL_TABLET | ORAL | 0 refills | Status: DC

## 2018-10-15 NOTE — Telephone Encounter (Signed)
Patient called after-hours line.  She was seen by her dentist had an abscess some other dental procedure done today.  She is seeing an oral surgeon next week however she was not given any antibiotics.  She is tried contacting their office and has been able unable to get through.  Prescribed amoxicillin 500 mg 3 times a day for 10 days Diflucan secondary to yeast infections after antibiotics.  She will still follow-up with her dentist/oral surgeon

## 2018-11-16 ENCOUNTER — Ambulatory Visit: Payer: 59 | Admitting: Nurse Practitioner

## 2018-11-16 ENCOUNTER — Encounter: Payer: Self-pay | Admitting: Gastroenterology

## 2018-11-16 ENCOUNTER — Telehealth: Payer: Self-pay | Admitting: Gastroenterology

## 2018-11-16 ENCOUNTER — Other Ambulatory Visit: Payer: Self-pay

## 2018-11-16 ENCOUNTER — Telehealth: Payer: Self-pay | Admitting: *Deleted

## 2018-11-16 NOTE — Telephone Encounter (Signed)
Noted  

## 2018-11-16 NOTE — Telephone Encounter (Signed)
Called patient at 8:00am and LMOVM to call back for her 8:00am appt with EG. Called back at 8:15am and still no answer. FYI to [place].

## 2018-11-16 NOTE — Telephone Encounter (Signed)
PATIENT NO SHOW/NO ANSWER-LETTER SENT

## 2018-11-30 ENCOUNTER — Other Ambulatory Visit: Payer: Self-pay | Admitting: Family Medicine

## 2018-12-16 IMAGING — NM NM MYOCAR MULTI W/SPECT W/WALL MOTION & EF
2 series · 12 of 12 positions shown · non-contrast
Comparison: none

[Series 1: stress gated · 8.28mm/px · 6 of 64 frames shown]
[frame 6/64]
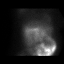
[frame 16/64]
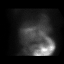
[frame 27/64]
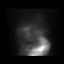
[frame 38/64]
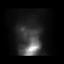
[frame 48/64]
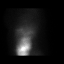
[frame 59/64]
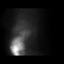

[Series 2: rest · 8.28mm/px · 6 of 64 frames shown]
[frame 6/64]
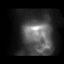
[frame 16/64]
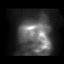
[frame 27/64]
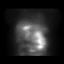
[frame 38/64]
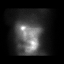
[frame 48/64]
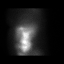
[frame 59/64]
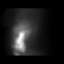

[12 of 12 positions shown; findings below may reference images not displayed]

Canned report from images found in remote index.

Refer to host system for actual result text.

## 2019-01-04 ENCOUNTER — Ambulatory Visit: Payer: 59 | Admitting: Family Medicine

## 2019-01-17 ENCOUNTER — Other Ambulatory Visit: Payer: Self-pay | Admitting: Family Medicine

## 2019-01-24 ENCOUNTER — Other Ambulatory Visit: Payer: Self-pay | Admitting: Family Medicine

## 2019-02-16 ENCOUNTER — Other Ambulatory Visit: Payer: Self-pay

## 2019-02-16 ENCOUNTER — Ambulatory Visit (INDEPENDENT_AMBULATORY_CARE_PROVIDER_SITE_OTHER): Payer: 59 | Admitting: Nurse Practitioner

## 2019-02-16 ENCOUNTER — Encounter: Payer: Self-pay | Admitting: Nurse Practitioner

## 2019-02-16 VITALS — BP 134/82 | HR 55 | Temp 97.2°F | Ht 66.0 in | Wt 277.8 lb

## 2019-02-16 DIAGNOSIS — Z800 Family history of malignant neoplasm of digestive organs: Secondary | ICD-10-CM | POA: Diagnosis not present

## 2019-02-16 MED ORDER — SUPREP BOWEL PREP KIT 17.5-3.13-1.6 GM/177ML PO SOLN: 1.0000 | ORAL | 0 refills | Status: DC

## 2019-02-16 NOTE — Patient Instructions (Signed)
Your health issues we discussed today were:   Family history of colon cancer: 1. Glad you are doing well! 2. It is time for your surveillance colonoscopy to check for colon cancer given your family history 3. We will schedule your colonoscopy for you.  Further recommendations will follow your colonoscopy  Overall I recommend:  1. Continue your current medications 2. Return for follow-up as needed or based on recommendations made after your colonoscopy 3. Call us if you have any questions or concerns.   Because of recent events of COVID-19 ("Coronavirus"), follow CDC recommendations:  Wash your hand frequently Avoid touching your face Stay away from people who are sick If you have symptoms such as fever, cough, shortness of breath then call your healthcare provider for further guidance If you are sick, STAY AT HOME unless otherwise directed by your healthcare provider. Follow directions from state and national officials regarding staying safe   At [facility] we value your feedback. You may receive a survey about your visit today. Please share your experience as we strive to create trusting relationships with our patients to provide genuine, compassionate, quality care.  We appreciate your understanding and patience as we review any laboratory studies, imaging, and other diagnostic tests that are ordered as we care for you. Our office policy is 5 business days for review of these results, and any emergent or urgent results are addressed in a timely manner for your best interest. If you do not hear from our office in 1 week, please contact us.   We also encourage the use of MyChart, which contains your medical information for your review as well. If you are not enrolled in this feature, an access code is on this after visit summary for your convenience. Thank you for allowing [place] to be involved in your care.  It was great to see you today!  I hope you have a great summer!!

## 2019-02-16 NOTE — Progress Notes (Signed)
CC'ED TO PCP 

## 2019-02-16 NOTE — Assessment & Plan Note (Signed)
Noted family history of colon cancer.  Last colonoscopy in 2014 recommended repeat in 5 years.  She is currently about 1 year overdue.  She is generally asymptomatic from a GI standpoint.  At this point we will proceed with surveillance colonoscopy.  Proceed with colonoscopy on propofol/MAC with Dr. Oneida Alar in the near future. The risks, benefits, and alternatives have been discussed in detail with the patient. They state understanding and desire to proceed.   Patient is currently on hydrocodone, Robaxin, Zoloft, trazodone.  Social alcohol, denies recreational drugs.  No iron or diabetes medications.  Denies any other anticoagulants, anxiolytics, chronic pain medications, or antidepressants.  We will plan for procedure on propofol/MAC to promote adequate sedation.
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
## 2019-02-16 NOTE — Patient Instructions (Signed)
PA for TCS submitted via [facility] website. Approved. PA# [id number], valid 05/16/19-08/14/19.

## 2019-02-17 ENCOUNTER — Other Ambulatory Visit: Payer: Self-pay | Admitting: Family Medicine

## 2019-02-21 ENCOUNTER — Encounter: Payer: Self-pay | Admitting: *Deleted

## 2019-03-01 ENCOUNTER — Other Ambulatory Visit: Payer: Self-pay | Admitting: Family Medicine

## 2019-03-20 ENCOUNTER — Other Ambulatory Visit: Payer: Self-pay | Admitting: Family Medicine

## 2019-04-14 ENCOUNTER — Other Ambulatory Visit: Payer: Self-pay | Admitting: Family Medicine

## 2019-05-04 ENCOUNTER — Other Ambulatory Visit: Payer: Self-pay | Admitting: Family Medicine

## 2019-05-09 ENCOUNTER — Telehealth: Payer: Self-pay

## 2019-05-09 NOTE — Telephone Encounter (Signed)
Endo scheduler called office, pt called her to reschedule TCS w/Propofol w/[facility] that was for 05/16/19 d/t she is out of town.  Called pt, TCS rescheduled to 08/02/19 at 1:30pm. Informed endo scheduler. Pre-op appt 07/28/19 at 10:00am. COVID test 08/01/19 at 8:30am. New instructions and appt letters mailed.

## 2019-05-12 ENCOUNTER — Other Ambulatory Visit (HOSPITAL_COMMUNITY): Payer: 59

## 2019-05-12 ENCOUNTER — Encounter (HOSPITAL_COMMUNITY): Admission: RE | Admit: 2019-05-12 | Payer: 59 | Source: Ambulatory Visit

## 2019-05-20 ENCOUNTER — Encounter: Payer: Self-pay | Admitting: Family Medicine

## 2019-05-20 ENCOUNTER — Ambulatory Visit (INDEPENDENT_AMBULATORY_CARE_PROVIDER_SITE_OTHER): Payer: 59 | Admitting: Family Medicine

## 2019-05-20 ENCOUNTER — Other Ambulatory Visit: Payer: Self-pay

## 2019-05-20 VITALS — BP 120/82 | HR 53 | Temp 98.3°F | Resp 15 | Ht 66.25 in | Wt 276.0 lb

## 2019-05-20 DIAGNOSIS — M778 Other enthesopathies, not elsewhere classified: Secondary | ICD-10-CM

## 2019-05-20 DIAGNOSIS — G8929 Other chronic pain: Secondary | ICD-10-CM

## 2019-05-20 DIAGNOSIS — M25512 Pain in left shoulder: Secondary | ICD-10-CM | POA: Diagnosis not present

## 2019-05-20 DIAGNOSIS — M7552 Bursitis of left shoulder: Secondary | ICD-10-CM

## 2019-05-20 DIAGNOSIS — Z23 Encounter for immunization: Secondary | ICD-10-CM | POA: Diagnosis not present

## 2019-05-20 MED ORDER — HYDROCODONE-ACETAMINOPHEN 5-325 MG PO TABS: 1.0000 | ORAL_TABLET | ORAL | 0 refills | Status: DC | PRN

## 2019-05-20 MED ORDER — METHYLPREDNISOLONE 4 MG PO TBPK: ORAL_TABLET | ORAL | 0 refills | Status: DC

## 2019-05-20 NOTE — Progress Notes (Signed)
   Subjective:    Patient ID: Cheryl Scott, female    DOB: [birth year], 48 y.o.   MRN: [id number]  Patient presents for Hand Pain and Shoulder Pain   Pt here with left hand at base of thumb and between thumb and index finger, has pain opening a jar 6 months   She does get some swelling Taking tylenol for pain  has chronic tingling numbness  In hands    Left shoulder pain for the past few months - has pain when she lays on that side, No known injury to the shoulder  Does recall that she was told that she has bone spurs   Occ takes norco from ER but she is running low on this medication she was last given this in 2019   She does take robaxin, no improvement    Does not take NSAIDS due to kidney function and stomach   Review Of Systems:  GEN- denies fatigue, fever, weight loss,weakness, recent illness HEENT- denies eye drainage, change in vision, nasal discharge, CVS- denies chest pain, palpitations RESP- denies SOB, cough, wheeze ABD- denies N/V, change in stools, abd pain GU- denies dysuria, hematuria, dribbling, incontinence MSK- +joint pain, muscle aches, injury Neuro- denies headache, dizziness, syncope, seizure activity       Objective:    BP 120/82   Pulse (!) 53   Temp 98.3 F (36.8 C) (Oral)   Resp 15   Ht 5' 6.25" (1.683 m)   Wt 276 lb (125.2 kg)   SpO2 99%   BMI 44.21 kg/m  GEN- NAD, alert and oriented x3 Neck- Supple, fair ROM, neg spurlings  CVS- RRR, no murmur RESP-CTAB MSK-fair ROM LUE compared to right, TTP near [facility] joint, biceps in tact, neg empty can  Left hand- TTP near snuff box and along thumb, able to grasp, neg finkelsteins, FROM wrist  EXT- No edema Pulses- Radial,  2+        Assessment & Plan:      Problem List Items Addressed This Visit    None    Visit Diagnoses    Tendinitis of left hand    -  Primary   Chronic hand and shoulder pain, no injury, concern for tendinitis in hand and probable bursitis, may have some OA in  shoulder, discussed trying Medrol dose pak, Norco and then using topical for the tendinitis as well in her hand.  If this does not work then we will get her over to orthopedics for imaging and she may need injection into the shoulder.   Need for immunization against influenza       Relevant Orders   Flu Vaccine QUAD 36+ mos IM (Completed)   Chronic left shoulder pain       Relevant Medications   methylPREDNISolone (MEDROL DOSEPAK) 4 MG TBPK tablet   HYDROcodone-acetaminophen (NORCO/VICODIN) 5-325 MG tablet   Bursitis of left shoulder          Note: This dictation was prepared with Dragon dictation along with smaller phrase technology. Any transcriptional errors that result from this process are unintentional.

## 2019-05-20 NOTE — Patient Instructions (Signed)
Try the medrol dosepak  F/U as previous

## 2019-05-30 ENCOUNTER — Other Ambulatory Visit: Payer: Self-pay

## 2019-05-30 ENCOUNTER — Encounter: Payer: Self-pay | Admitting: Family Medicine

## 2019-05-30 ENCOUNTER — Ambulatory Visit (INDEPENDENT_AMBULATORY_CARE_PROVIDER_SITE_OTHER): Payer: 59 | Admitting: Family Medicine

## 2019-05-30 DIAGNOSIS — K529 Noninfective gastroenteritis and colitis, unspecified: Secondary | ICD-10-CM

## 2019-05-30 MED ORDER — ONDANSETRON 4 MG PO TBDP: 4.0000 mg | ORAL_TABLET | Freq: Three times a day (TID) | ORAL | 0 refills | Status: DC | PRN

## 2019-05-30 NOTE — Progress Notes (Signed)
Virtual Visit via Telephone Note  I connected with Cheryl Scott on 05/30/19 at 2:48pm by telephone and verified that I am speaking with the correct person using two identifiers.      Pt location: at home   Physician location:  In office, [facility], Vic Blackbird MD     On call: patient and physician   I discussed the limitations, risks, security and privacy concerns of performing an evaluation and management service by telephone and the availability of in person appointments. I also discussed with the patient that there may be a patient responsible charge related to this service. The patient expressed understanding and agreed to proceed.   History of Present Illness:  Friday night, started feeling very weak and fatigued and fell into the floor after having diarrhea stool. She thought she may have had food poisoning. She had emesis x 1. She had chills, sweats on Friday.  Multiple diarrhea stools all night through mid-day Saturday. Saturday continued to have GI upset and belching started and had emesis x 1. Saturday, felt a wretching, no cough or congestion. She took alka seltzer  Symptoms has continued through the weekend,, she has been able to tolerate crackers past 2 days No documented fever   Diarrhea has slowly improved. No respiratory symptoms no sinus pressure or drainage no known Covid contacts Observations/Objective: NAD noted on phone Per pt Non tender in lower quadrants of abdomen, has mild discomfort in epigastric region   Assessment and Plan: Gastroenteritis- will try to hydrate better, start bland foods.  I have sent over Zofran for the nausea.  I am unable to examine her but her diarrhea is already improved so we will hold on using antidiarrhea at this time.  Does not sound to be like the classic diverticulitis she did have some mild diverticulosis noted on her colonoscopy years ago.  For her epigastric discomfort I think this is coming from the belching  retching and vomiting.  She can increase her PPI omeprazole to 40 mg once a day temporarily.  If for some reason her symptoms worsen we will bring her in for an office visit or she can go to the nearest ER.  Follow Up Instructions:    I discussed the assessment and treatment plan with the patient. The patient was provided an opportunity to ask questions and all were answered. The patient agreed with the plan and demonstrated an understanding of the instructions.   The patient was advised to call back or seek an in-person evaluation if the symptoms worsen or if the condition fails to improve as anticipated.  I provided 19 minutes of non-face-to-face time during this encounter. End time 3:07pm  Vic Blackbird, MD

## 2019-06-13 ENCOUNTER — Other Ambulatory Visit: Payer: Self-pay | Admitting: Family Medicine

## 2019-06-13 ENCOUNTER — Telehealth: Payer: Self-pay | Admitting: *Deleted

## 2019-06-13 DIAGNOSIS — G8929 Other chronic pain: Secondary | ICD-10-CM

## 2019-06-13 DIAGNOSIS — M25512 Pain in left shoulder: Secondary | ICD-10-CM

## 2019-06-13 DIAGNOSIS — M778 Other enthesopathies, not elsewhere classified: Secondary | ICD-10-CM

## 2019-06-13 DIAGNOSIS — M7552 Bursitis of left shoulder: Secondary | ICD-10-CM

## 2019-06-13 MED ORDER — HYDROCODONE-ACETAMINOPHEN 5-325 MG PO TABS: 1.0000 | ORAL_TABLET | ORAL | 0 refills | Status: DC | PRN

## 2019-06-13 NOTE — Telephone Encounter (Signed)
Received call from patient.   Reports that she was seen on 05/20/2019 for tendinitis. Reports that Medrol dose pack did not offer much relief.   States that she is now out of Norco and is requesting refill. Ok to refill?  Also requesting referral to ortho. Ok to place referral?

## 2019-06-20 ENCOUNTER — Other Ambulatory Visit: Payer: Self-pay | Admitting: Family Medicine

## 2019-06-21 ENCOUNTER — Ambulatory Visit: Payer: 59

## 2019-06-21 ENCOUNTER — Other Ambulatory Visit: Payer: Self-pay

## 2019-06-21 ENCOUNTER — Encounter: Payer: Self-pay | Admitting: Orthopaedic Surgery

## 2019-06-21 ENCOUNTER — Ambulatory Visit: Payer: 59 | Admitting: Orthopaedic Surgery

## 2019-06-21 VITALS — BP 144/86 | HR 54 | Temp 97.1°F | Ht 67.5 in | Wt 271.0 lb

## 2019-06-21 DIAGNOSIS — M79642 Pain in left hand: Secondary | ICD-10-CM | POA: Diagnosis not present

## 2019-06-21 DIAGNOSIS — Z6841 Body Mass Index (BMI) 40.0 and over, adult: Secondary | ICD-10-CM | POA: Diagnosis not present

## 2019-06-21 MED ORDER — PREDNISONE 5 MG (21) PO TBPK: ORAL_TABLET | ORAL | 0 refills | Status: DC
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
## 2019-06-21 NOTE — Patient Instructions (Signed)
You will get a call from [facility] to schedule the nerve study

## 2019-06-30 ENCOUNTER — Other Ambulatory Visit: Payer: Self-pay

## 2019-06-30 ENCOUNTER — Ambulatory Visit: Payer: 59 | Admitting: Orthopaedic Surgery

## 2019-06-30 ENCOUNTER — Telehealth: Payer: Self-pay | Admitting: Orthopaedic Surgery

## 2019-06-30 DIAGNOSIS — M79642 Pain in left hand: Secondary | ICD-10-CM

## 2019-06-30 MED ORDER — HYDROCODONE-ACETAMINOPHEN 5-325 MG PO TABS: ORAL_TABLET | ORAL | 0 refills | Status: DC

## 2019-06-30 NOTE — Telephone Encounter (Signed)
Order for uric acid has been sent per Dr Luna Glasgow instruction.  I called and told the patient that Cheryl Scott could go over to get this drawn.  Cheryl Scott said Cheryl Scott would.

## 2019-06-30 NOTE — Telephone Encounter (Signed)
I called and have gotten the patient an appointment for [facility] for 07/26/19 at 11:00  The lady I spoke with said this is their first opening.  Patient asks for a prescription for Hydrocodone/Acetaminophen 5-325 mgs.  Be sent in for [facility] to [facility] on [address]  She will go for her labs also as you have requested.

## 2019-07-12 ENCOUNTER — Encounter: Payer: Self-pay | Admitting: Family Medicine

## 2019-07-14 ENCOUNTER — Telehealth: Payer: Self-pay | Admitting: *Deleted

## 2019-07-14 NOTE — Telephone Encounter (Signed)
Patient called in. She wants to cancel her procedure with [facility] scheduled for 08/02/2019. Patient did not want to r/s at this time. When asked for the reason she just stated she needs to cancel. Advised to call when ready to r/s for OV. Called endo and LMOVM to cancel

## 2019-07-15 NOTE — Telephone Encounter (Signed)
Noted  

## 2019-07-26 ENCOUNTER — Encounter: Payer: 59 | Admitting: Physical Medicine and Rehabilitation

## 2019-07-28 ENCOUNTER — Encounter (HOSPITAL_COMMUNITY): Payer: 59

## 2019-07-28 ENCOUNTER — Ambulatory Visit: Payer: 59 | Admitting: Orthopaedic Surgery

## 2019-08-01 ENCOUNTER — Other Ambulatory Visit (HOSPITAL_COMMUNITY): Payer: 59

## 2019-08-02 ENCOUNTER — Encounter (HOSPITAL_COMMUNITY): Admission: RE | Payer: Self-pay | Source: Home / Self Care

## 2019-08-02 ENCOUNTER — Ambulatory Visit (HOSPITAL_COMMUNITY): Admission: RE | Admit: 2019-08-02 | Payer: 59 | Source: Home / Self Care | Admitting: Gastroenterology

## 2019-08-02 SURGERY — COLONOSCOPY WITH PROPOFOL
Anesthesia: Monitor Anesthesia Care

## 2019-08-03 ENCOUNTER — Other Ambulatory Visit: Payer: Self-pay | Admitting: Family Medicine

## 2019-08-06 ENCOUNTER — Other Ambulatory Visit: Payer: Self-pay | Admitting: Family Medicine

## 2019-08-06 ENCOUNTER — Other Ambulatory Visit: Payer: Self-pay

## 2019-08-08 MED ORDER — ONDANSETRON 4 MG PO TBDP: 4.0000 mg | ORAL_TABLET | Freq: Three times a day (TID) | ORAL | 0 refills | Status: DC | PRN

## 2019-08-08 MED ORDER — HYDROCODONE-ACETAMINOPHEN 5-325 MG PO TABS: ORAL_TABLET | ORAL | 0 refills | Status: DC

## 2019-08-08 MED ORDER — HYDROXYZINE HCL 25 MG PO TABS: 25.0000 mg | ORAL_TABLET | Freq: Three times a day (TID) | ORAL | 0 refills | Status: DC | PRN

## 2019-08-11 ENCOUNTER — Encounter: Payer: 59 | Admitting: Physical Medicine and Rehabilitation

## 2019-08-11 ENCOUNTER — Other Ambulatory Visit: Payer: Self-pay | Admitting: Family Medicine

## 2019-08-16 ENCOUNTER — Ambulatory Visit: Payer: 59 | Admitting: Orthopaedic Surgery

## 2019-08-30 ENCOUNTER — Encounter: Payer: Self-pay | Admitting: Family Medicine

## 2019-09-02 ENCOUNTER — Encounter: Payer: 59 | Admitting: Physical Medicine and Rehabilitation

## 2019-09-07 ENCOUNTER — Other Ambulatory Visit: Payer: Self-pay | Admitting: Family Medicine

## 2019-09-07 ENCOUNTER — Ambulatory Visit: Payer: 59 | Admitting: Family Medicine

## 2019-09-19 ENCOUNTER — Other Ambulatory Visit: Payer: 59

## 2019-09-20 ENCOUNTER — Other Ambulatory Visit: Payer: 59

## 2019-09-21 ENCOUNTER — Other Ambulatory Visit: Payer: Self-pay | Admitting: Family Medicine

## 2019-09-21 ENCOUNTER — Encounter: Payer: Self-pay | Admitting: Family Medicine

## 2019-09-21 ENCOUNTER — Other Ambulatory Visit: Payer: 59

## 2019-09-21 MED ORDER — LISINOPRIL 20 MG PO TABS: 20.0000 mg | ORAL_TABLET | Freq: Every day | ORAL | 0 refills | Status: DC

## 2019-10-07 ENCOUNTER — Other Ambulatory Visit: Payer: Self-pay | Admitting: Family Medicine

## 2019-10-25 ENCOUNTER — Telehealth: Payer: Self-pay | Admitting: *Deleted

## 2019-10-25 MED ORDER — PANTOPRAZOLE SODIUM 40 MG PO TBEC: 40.0000 mg | DELAYED_RELEASE_TABLET | Freq: Two times a day (BID) | ORAL | 3 refills | Status: DC

## 2019-10-25 NOTE — Telephone Encounter (Signed)
Prescription sent to pharmacy.

## 2019-10-25 NOTE — Telephone Encounter (Signed)
Received call from patient.   Reports that she has increased acid reflux and indigestion. States that she is currently on Omeprazole 20mg  PO BID with no relief. States that she has tried every over the counter medication with no relief.   Ok to switch to [employer]?

## 2019-10-25 NOTE — Telephone Encounter (Signed)
Switch to protonix 40 bid

## 2019-10-31 ENCOUNTER — Telehealth: Payer: Self-pay | Admitting: Family Medicine

## 2019-10-31 MED ORDER — FLUCONAZOLE 150 MG PO TABS: 150.0000 mg | ORAL_TABLET | Freq: Once | ORAL | 0 refills | Status: AC

## 2019-10-31 MED ORDER — AMOXICILLIN 500 MG PO CAPS: 500.0000 mg | ORAL_CAPSULE | Freq: Three times a day (TID) | ORAL | 0 refills | Status: AC

## 2019-10-31 NOTE — Telephone Encounter (Signed)
Okay to send diflucan

## 2019-10-31 NOTE — Telephone Encounter (Signed)
Diflucan sent to patient's pharmacy

## 2019-10-31 NOTE — Telephone Encounter (Signed)
Okay to send Amoxicillin 500mg  TID for 7 days  You can send to pharmacy in [place] salt water  If it worsens go to local ER

## 2019-10-31 NOTE — Telephone Encounter (Signed)
Left message return call

## 2019-10-31 NOTE — Telephone Encounter (Signed)
Patient notified of antibiotic being sent in. Patient would like to know if we can send in diflucan as she states she gets yeast infections with antibiotic therapy. Please advise?

## 2019-10-31 NOTE — Telephone Encounter (Signed)
Patient called in today with c/o an abscess tooth. States that she is out of town in [place] and is unable to get in with a dentist. She would like to know if you can call in Amoxicillin for her. Please advise?

## 2019-11-08 ENCOUNTER — Ambulatory Visit: Payer: Self-pay

## 2019-11-08 ENCOUNTER — Other Ambulatory Visit: Payer: Self-pay

## 2019-11-08 ENCOUNTER — Ambulatory Visit (INDEPENDENT_AMBULATORY_CARE_PROVIDER_SITE_OTHER): Payer: 59 | Admitting: Orthopedic Surgery

## 2019-11-08 ENCOUNTER — Encounter: Payer: Self-pay | Admitting: Orthopedic Surgery

## 2019-11-08 ENCOUNTER — Ambulatory Visit: Payer: 59 | Admitting: Orthopedic Surgery

## 2019-11-08 VITALS — Ht 67.0 in | Wt 271.0 lb

## 2019-11-08 DIAGNOSIS — M79642 Pain in left hand: Secondary | ICD-10-CM

## 2019-11-08 DIAGNOSIS — M25561 Pain in right knee: Secondary | ICD-10-CM | POA: Diagnosis not present

## 2019-11-08 DIAGNOSIS — M654 Radial styloid tenosynovitis [de Quervain]: Secondary | ICD-10-CM

## 2019-11-09 ENCOUNTER — Encounter: Payer: Self-pay | Admitting: Orthopedic Surgery
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
## 2019-11-11 MED ORDER — LIDOCAINE HCL 1 % IJ SOLN
5.0000 mL | INTRAMUSCULAR | Status: AC | PRN
  Administered 2019-11-11: 5 mL

## 2019-11-11 MED ORDER — METHYLPREDNISOLONE ACETATE 40 MG/ML IJ SUSP
40.0000 mg | INTRAMUSCULAR | Status: AC | PRN
  Administered 2019-11-11: 40 mg via INTRA_ARTICULAR

## 2019-11-11 MED ORDER — METHYLPREDNISOLONE ACETATE 40 MG/ML IJ SUSP
40.0000 mg | INTRAMUSCULAR | Status: AC | PRN
  Administered 2019-11-09: 40 mg

## 2019-11-11 MED ORDER — LIDOCAINE HCL 1 % IJ SOLN
1.0000 mL | INTRAMUSCULAR | Status: AC | PRN
  Administered 2019-11-09: 1 mL

## 2019-11-12 ENCOUNTER — Other Ambulatory Visit: Payer: Self-pay | Admitting: Orthopaedic Surgery

## 2019-11-14 ENCOUNTER — Other Ambulatory Visit: Payer: Self-pay | Admitting: Orthopaedic Surgery

## 2019-11-14 NOTE — Telephone Encounter (Signed)
No.  Has not been seen since early December 2020.

## 2019-11-15 NOTE — Telephone Encounter (Signed)
No.  Has not been in office in many months, Nov/Dec.

## 2019-11-15 NOTE — Telephone Encounter (Signed)
Cheryl Scott I will show you what to do with this when this happens.

## 2019-11-15 NOTE — Telephone Encounter (Signed)
Will you refuse the prescription I am not authorized to get rid of it.

## 2019-11-22 ENCOUNTER — Ambulatory Visit: Payer: 59 | Admitting: Orthopaedic Surgery

## 2019-11-22 ENCOUNTER — Ambulatory Visit: Payer: Self-pay | Admitting: Family Medicine

## 2019-12-05 ENCOUNTER — Telehealth: Payer: Self-pay | Admitting: *Deleted

## 2019-12-05 NOTE — Telephone Encounter (Signed)
Pt consented to a virtual visit. 

## 2019-12-05 NOTE — Telephone Encounter (Signed)
Franchot Erichsen, you are scheduled for a virtual visit with your provider today.  Just as we do with appointments in the office, we must obtain your consent to participate.  Your consent will be active for this visit and any virtual visit you may have with one of our providers in the next 365 days.  If you have a MyChart account, I can also send a copy of this consent to you electronically.  All virtual visits are billed to your insurance company just like a traditional visit in the office.  As this is a virtual visit, video technology does not allow for your provider to perform a traditional examination.  This may limit your provider's ability to fully assess your condition.  If your provider identifies any concerns that need to be evaluated in person or the need to arrange testing such as labs, EKG, etc, we will make arrangements to do so.  Although advances in technology are sophisticated, we cannot ensure that it will always work on either your end or our end.  If the connection with a video visit is poor, we may have to switch to a telephone visit.  With either a video or telephone visit, we are not always able to ensure that we have a secure connection.   I need to obtain your verbal consent now.   Are you willing to proceed with your visit today?

## 2019-12-06 ENCOUNTER — Encounter: Payer: Self-pay | Admitting: Nurse Practitioner

## 2019-12-06 ENCOUNTER — Other Ambulatory Visit: Payer: Self-pay | Admitting: *Deleted

## 2019-12-06 ENCOUNTER — Other Ambulatory Visit: Payer: Self-pay

## 2019-12-06 ENCOUNTER — Telehealth (INDEPENDENT_AMBULATORY_CARE_PROVIDER_SITE_OTHER): Payer: 59 | Admitting: Nurse Practitioner

## 2019-12-06 DIAGNOSIS — K219 Gastro-esophageal reflux disease without esophagitis: Secondary | ICD-10-CM

## 2019-12-06 DIAGNOSIS — A084 Viral intestinal infection, unspecified: Secondary | ICD-10-CM | POA: Diagnosis not present

## 2019-12-06 DIAGNOSIS — K5909 Other constipation: Secondary | ICD-10-CM | POA: Diagnosis not present

## 2019-12-06 DIAGNOSIS — R142 Eructation: Secondary | ICD-10-CM

## 2019-12-06 DIAGNOSIS — R112 Nausea with vomiting, unspecified: Secondary | ICD-10-CM | POA: Insufficient documentation

## 2019-12-06 MED ORDER — ALIGN PO CAPS: 1.0000 | ORAL_CAPSULE | Freq: Every day | ORAL | 1 refills | Status: DC

## 2019-12-06 MED ORDER — LINACLOTIDE 145 MCG PO CAPS: 145.0000 ug | ORAL_CAPSULE | Freq: Every day | ORAL | 3 refills | Status: DC

## 2019-12-06 MED ORDER — FLUCONAZOLE 150 MG PO TABS: 150.0000 mg | ORAL_TABLET | Freq: Once | ORAL | 0 refills | Status: AC
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
## 2019-12-13 ENCOUNTER — Other Ambulatory Visit: Payer: Self-pay | Admitting: Family Medicine

## 2019-12-13 NOTE — Patient Instructions (Addendum)
Your health issues we discussed today were:   GERD (reflux/heartburn): 1. Continue taking your current medications with Protonix twice daily 2. Call us if your symptoms are no better within the next 2 to 4 weeks. 3. At that point, we can consider adding an additional medication in the evening when your symptoms tend to be worse 4. Call us for any worsening or severe symptoms.  Constipation: 1. As requested, we have recommended Linzess 145 mcg 2. This is a middle range dose between the 2 previous doses you have tried 3. Call us in a week or 2 and let us know if this is working any better 4. If not, we can try another medication  Likely previous stomach virus: 1. As we discussed, your symptoms 2 months ago were likely a self-limited "viral gastroenteritis" 2. You can try taking over-the-counter probiotics and see if this helps normalize your gut function 3. Some brands we have had good luck with include align, Restora, Hardin Negus colon health, Publishing copy. 4. Call us if you have any worsening or severe symptoms  Overall I recommend:  1. Continue your other current medications 2. Return for follow-up in 3 months 3. Call us if you have any questions or concerns   At [facility] we value your feedback. You may receive a survey about your visit today. Please share your experience as we strive to create trusting relationships with our patients to provide genuine, compassionate, quality care.  We appreciate your understanding and patience as we review any laboratory studies, imaging, and other diagnostic tests that are ordered as we care for you. Our office policy is 5 business days for review of these results, and any emergent or urgent results are addressed in a timely manner for your best interest. If you do not hear from our office in 1 week, please contact us.   We also encourage the use of MyChart, which contains your medical information for your review as well. If you are not  enrolled in this feature, an access code is on this after visit summary for your convenience. Thank you for allowing [place] to be involved in your care.  It was great to see you today!  I hope you have a great Summer!!

## 2019-12-13 NOTE — Assessment & Plan Note (Signed)
The patient has known chronic constipation and was on Linzess 72 mcg which was not enough.  Her dose was increased to 290 mcg which she feels is too strong.  She is requesting a change in dose to something in the middle.  I have provided a few samples of Linzess 145 mcg requested progress report.  If this not effective for her then we can consider switching to another regimen such as Trulance, Amitiza.  No chronic opioid use (although she does have a short course currently) to suggest OIC and therefore Movantik, Symproic would not likely be effective.

## 2019-12-13 NOTE — Assessment & Plan Note (Signed)
About 2 months prior the patient had a bad episode with nausea, vomiting, diarrhea, and diaphoresis that lasted about 3 days.  She does have some intermittent symptoms that she feels are like "a stomach flu."  Some flatulence recently increased.  Likely a self resolving case of viral gastroenteritis.  I recommended that she take over-the-counter probiotics for 1 to 2 months to see if this helps normalize her symptoms.  Examples of reputable brands will be provided.  Call for any worsening symptoms.

## 2019-12-13 NOTE — Assessment & Plan Note (Signed)
Noted chronic history of GERD.  Recently has been having more symptoms, although significantly improved on Protonix compared to when she was on omeprazole.  No other NSAIDs other than 2 doses of aspirin recently.  Was having some epigastric discomfort and bulging, although this seems to have somewhat improved.  When she does have symptoms of 10 to be worse in the evenings.  At this point I will have her continue her meds.  We can consider adding an H2 receptor blocker if her symptoms continue to be bothersome.  Follow-up in 3 months.  Call for any worsening symptoms.

## 2019-12-15 DIAGNOSIS — F418 Other specified anxiety disorders: Secondary | ICD-10-CM | POA: Insufficient documentation

## 2019-12-18 ENCOUNTER — Other Ambulatory Visit: Payer: Self-pay | Admitting: Family Medicine

## 2019-12-19 MED ORDER — LISINOPRIL 20 MG PO TABS: 20.0000 mg | ORAL_TABLET | Freq: Every day | ORAL | 0 refills | Status: DC

## 2019-12-30 ENCOUNTER — Encounter: Payer: Self-pay | Admitting: Orthopedic Surgery

## 2020-01-03 ENCOUNTER — Encounter (HOSPITAL_COMMUNITY): Payer: Self-pay | Admitting: Emergency Medicine

## 2020-01-03 ENCOUNTER — Other Ambulatory Visit: Payer: Self-pay

## 2020-01-03 DIAGNOSIS — E119 Type 2 diabetes mellitus without complications: Secondary | ICD-10-CM | POA: Insufficient documentation

## 2020-01-03 DIAGNOSIS — Z888 Allergy status to other drugs, medicaments and biological substances status: Secondary | ICD-10-CM | POA: Diagnosis not present

## 2020-01-03 DIAGNOSIS — N183 Chronic kidney disease, stage 3 unspecified: Secondary | ICD-10-CM | POA: Diagnosis not present

## 2020-01-03 DIAGNOSIS — R220 Localized swelling, mass and lump, head: Secondary | ICD-10-CM | POA: Insufficient documentation

## 2020-01-03 DIAGNOSIS — Z885 Allergy status to narcotic agent status: Secondary | ICD-10-CM | POA: Insufficient documentation

## 2020-01-03 DIAGNOSIS — E669 Obesity, unspecified: Secondary | ICD-10-CM | POA: Insufficient documentation

## 2020-01-03 DIAGNOSIS — Z880 Allergy status to penicillin: Secondary | ICD-10-CM | POA: Insufficient documentation

## 2020-01-03 DIAGNOSIS — R070 Pain in throat: Secondary | ICD-10-CM | POA: Diagnosis not present

## 2020-01-03 DIAGNOSIS — I129 Hypertensive chronic kidney disease with stage 1 through stage 4 chronic kidney disease, or unspecified chronic kidney disease: Secondary | ICD-10-CM | POA: Insufficient documentation

## 2020-01-03 DIAGNOSIS — Z79899 Other long term (current) drug therapy: Secondary | ICD-10-CM | POA: Insufficient documentation

## 2020-01-03 DIAGNOSIS — Z87891 Personal history of nicotine dependence: Secondary | ICD-10-CM | POA: Diagnosis not present

## 2020-01-03 DIAGNOSIS — J302 Other seasonal allergic rhinitis: Secondary | ICD-10-CM | POA: Diagnosis present

## 2020-01-03 DIAGNOSIS — Z882 Allergy status to sulfonamides status: Secondary | ICD-10-CM | POA: Diagnosis not present

## 2020-01-03 DIAGNOSIS — R0603 Acute respiratory distress: Secondary | ICD-10-CM | POA: Insufficient documentation

## 2020-01-03 NOTE — ED Triage Notes (Signed)
Pt c/o ears itching, sore throat, and bottom lip slightly swollen.

## 2020-01-04 ENCOUNTER — Ambulatory Visit (INDEPENDENT_AMBULATORY_CARE_PROVIDER_SITE_OTHER): Payer: 59 | Admitting: Family

## 2020-01-04 ENCOUNTER — Emergency Department (HOSPITAL_COMMUNITY)
Admission: EM | Admit: 2020-01-04 | Discharge: 2020-01-04 | Disposition: A | Discharge status: Home / Self Care | Payer: 59 | Attending: Emergency Medicine | Admitting: Emergency Medicine

## 2020-01-04 ENCOUNTER — Ambulatory Visit: Payer: Self-pay

## 2020-01-04 ENCOUNTER — Encounter: Payer: Self-pay | Admitting: Family

## 2020-01-04 VITALS — Ht 67.0 in | Wt 260.0 lb

## 2020-01-04 DIAGNOSIS — G8929 Other chronic pain: Secondary | ICD-10-CM

## 2020-01-04 DIAGNOSIS — M25562 Pain in left knee: Secondary | ICD-10-CM | POA: Diagnosis not present

## 2020-01-04 DIAGNOSIS — J029 Acute pharyngitis, unspecified: Secondary | ICD-10-CM

## 2020-01-04 DIAGNOSIS — R220 Localized swelling, mass and lump, head: Secondary | ICD-10-CM

## 2020-01-04 DIAGNOSIS — J302 Other seasonal allergic rhinitis: Secondary | ICD-10-CM

## 2020-01-04 DIAGNOSIS — M79645 Pain in left finger(s): Secondary | ICD-10-CM

## 2020-01-04 LAB — URINALYSIS, ROUTINE W REFLEX MICROSCOPIC
Bilirubin Urine: NEGATIVE
Glucose, UA: NEGATIVE mg/dL
Ketones, ur: NEGATIVE mg/dL
Leukocytes,Ua: NEGATIVE
Nitrite: NEGATIVE
Protein, ur: 100 mg/dL — AB
Specific Gravity, Urine: 1.016 (ref 1.005–1.030)
pH: 5 (ref 5.0–8.0)

## 2020-01-04 LAB — GROUP A STREP BY PCR: Group A Strep by PCR: NOT DETECTED

## 2020-01-04 MED ORDER — METHYLPREDNISOLONE ACETATE 40 MG/ML IJ SUSP
20.0000 mg | INTRAMUSCULAR | Status: AC | PRN
Start: 2020-01-04 — End: 2020-01-04
  Administered 2020-01-04: 20 mg

## 2020-01-04 MED ORDER — LIDOCAINE HCL 1 % IJ SOLN
0.5000 mL | INTRAMUSCULAR | Status: AC | PRN
  Administered 2020-01-04: .5 mL

## 2020-01-04 MED ORDER — METHYLPREDNISOLONE ACETATE 40 MG/ML IJ SUSP
40.0000 mg | INTRAMUSCULAR | Status: AC | PRN
  Administered 2020-01-04: 40 mg via INTRA_ARTICULAR

## 2020-01-04 MED ORDER — DIPHENHYDRAMINE HCL 25 MG PO CAPS
25.0000 mg | ORAL_CAPSULE | Freq: Once | ORAL | Status: AC
  Administered 2020-01-04: 25 mg via ORAL
  Filled 2020-01-04: qty 1

## 2020-01-04 MED ORDER — PREDNISONE 20 MG PO TABS
ORAL_TABLET | ORAL | 0 refills | Status: DC
Start: 2020-01-04 — End: 2020-03-12

## 2020-01-04 MED ORDER — LIDOCAINE HCL 1 % IJ SOLN
5.0000 mL | INTRAMUSCULAR | Status: AC | PRN
  Administered 2020-01-04: 5 mL

## 2020-01-04 MED ORDER — PREDNISONE 50 MG PO TABS
60.0000 mg | ORAL_TABLET | Freq: Once | ORAL | Status: AC
  Administered 2020-01-04: 60 mg via ORAL
  Filled 2020-01-04: qty 1

## 2020-01-04 NOTE — Discharge Instructions (Addendum)
Please let Dr. [name] know that we recommended you stop your lisinopril because of the swelling of your lip today.  Take the prednisone until gone.  You can take over-the-counter Claritin or Zyrtec once a day for allergies.  You can take Benadryl 25 to 50 mg every 8 hours if needed for swelling or itching that is not controlled by the Zyrtec or the Claritin.  Recheck if you are unable to swallow, you struggle to breathe, or the swelling in your lip gets worse.  Your urine did not show any evidence of infection.  You can take Azo over-the-counter to see if that helps with the need to get to the bathroom quick.
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
## 2020-01-05 ENCOUNTER — Other Ambulatory Visit: Payer: Self-pay

## 2020-01-05 ENCOUNTER — Ambulatory Visit (INDEPENDENT_AMBULATORY_CARE_PROVIDER_SITE_OTHER): Payer: 59 | Admitting: Nurse Practitioner

## 2020-01-05 VITALS — BP 112/68 | HR 44 | Temp 97.6°F | Resp 18 | Wt 275.0 lb

## 2020-01-05 DIAGNOSIS — R399 Unspecified symptoms and signs involving the genitourinary system: Secondary | ICD-10-CM | POA: Diagnosis not present

## 2020-01-05 DIAGNOSIS — I10 Essential (primary) hypertension: Secondary | ICD-10-CM | POA: Diagnosis not present

## 2020-01-05 DIAGNOSIS — E119 Type 2 diabetes mellitus without complications: Secondary | ICD-10-CM

## 2020-01-05 DIAGNOSIS — E559 Vitamin D deficiency, unspecified: Secondary | ICD-10-CM

## 2020-01-05 DIAGNOSIS — Z13220 Encounter for screening for lipoid disorders: Secondary | ICD-10-CM

## 2020-01-05 LAB — MICROSCOPIC MESSAGE

## 2020-01-05 LAB — URINALYSIS, ROUTINE W REFLEX MICROSCOPIC
Bacteria, UA: NONE SEEN /HPF
Bilirubin Urine: NEGATIVE
Glucose, UA: NEGATIVE
Hyaline Cast: NONE SEEN /LPF
Ketones, ur: NEGATIVE
Leukocytes,Ua: NEGATIVE
Nitrite: NEGATIVE
Specific Gravity, Urine: 1.019 (ref 1.001–1.03)
WBC, UA: NONE SEEN /HPF (ref 0–5)
pH: 5.5 (ref 5.0–8.0)

## 2020-01-05 NOTE — Patient Instructions (Addendum)
Take blood pressure at home keeping log. Blood pressure goal 140/90 or less. Hold on taking any blood pressure medication at this time. Your blood pressure reading and heart rate are on the low end and I would like you to follow up next week for re-evaluation.  Follow 2 Gram per day sodium diet, low cholesterol, low fat, low carb, low sugar diet  Continue taking Prednisone and other medication/directions from ER for angio-edema r/t Lisinopril.   For your urinary symptoms, You are having your period and urinalysis is negative for UTI. Follow up Next week if sxs continue or worsen. Drink plenty of water, may drink cranberry juice.   Labs checked that are due for regular health maintanace: follow up with your regular PCP for wellness annual examination.
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
## 2020-01-06 LAB — COMPLETE METABOLIC PANEL WITH GFR
AG Ratio: 1.3 (calc) (ref 1.0–2.5)
ALT: 9 U/L (ref 6–29)
AST: 9 U/L — ABNORMAL LOW (ref 10–35)
Albumin: 4 g/dL (ref 3.6–5.1)
Alkaline phosphatase (APISO): 80 U/L (ref 37–153)
BUN/Creatinine Ratio: 11 (calc) (ref 6–22)
BUN: 15 mg/dL (ref 7–25)
CO2: 27 mmol/L (ref 20–32)
Calcium: 9.3 mg/dL (ref 8.6–10.4)
Chloride: 103 mmol/L (ref 98–110)
Creat: 1.41 mg/dL — ABNORMAL HIGH (ref 0.50–1.05)
GFR, Est African American: 50 mL/min/{1.73_m2} — ABNORMAL LOW (ref >=60)
GFR, Est Non African American: 43 mL/min/{1.73_m2} — ABNORMAL LOW (ref >=60)
Globulin: 3 g/dL (calc) (ref 1.9–3.7)
Glucose, Bld: 94 mg/dL (ref 65–99)
Potassium: 3.8 mmol/L (ref 3.5–5.3)
Sodium: 139 mmol/L (ref 135–146)
Total Bilirubin: 0.3 mg/dL (ref 0.2–1.2)
Total Protein: 7 g/dL (ref 6.1–8.1)

## 2020-01-06 LAB — CBC WITH DIFFERENTIAL/PLATELET
Absolute Monocytes: 419 cells/uL (ref 200–950)
Basophils Absolute: 37 cells/uL (ref 0–200)
Basophils Relative: 0.4 %
Eosinophils Absolute: 112 cells/uL (ref 15–500)
Eosinophils Relative: 1.2 %
HCT: 34.8 % — ABNORMAL LOW (ref 35.0–45.0)
Hemoglobin: 11.2 g/dL — ABNORMAL LOW (ref 11.7–15.5)
Lymphs Abs: 2911 cells/uL (ref 850–3900)
MCH: 26.6 pg — ABNORMAL LOW (ref 27.0–33.0)
MCHC: 32.2 g/dL (ref 32.0–36.0)
MCV: 82.7 fL (ref 80.0–100.0)
MPV: 10 fL (ref 7.5–12.5)
Monocytes Relative: 4.5 %
Neutro Abs: 5822 cells/uL (ref 1500–7800)
Neutrophils Relative %: 62.6 %
Platelets: 304 10*3/uL (ref 140–400)
RBC: 4.21 10*6/uL (ref 3.80–5.10)
RDW: 14.3 % (ref 11.0–15.0)
Total Lymphocyte: 31.3 %
WBC: 9.3 10*3/uL (ref 3.8–10.8)

## 2020-01-06 LAB — LIPID PANEL
Cholesterol: 203 mg/dL — ABNORMAL HIGH (ref <200)
HDL: 64 mg/dL (ref >=50)
LDL Cholesterol (Calc): 115 mg/dL (calc) — ABNORMAL HIGH
Non-HDL Cholesterol (Calc): 139 mg/dL (calc) — ABNORMAL HIGH (ref <130)
Total CHOL/HDL Ratio: 3.2 (calc) (ref <5.0)
Triglycerides: 126 mg/dL (ref <150)

## 2020-01-06 LAB — HEMOGLOBIN A1C
Hgb A1c MFr Bld: 5.8 % of total Hgb — ABNORMAL HIGH (ref <5.7)
Mean Plasma Glucose: 120 (calc)
eAG (mmol/L): 6.6 (calc)

## 2020-01-06 LAB — VITAMIN D 25 HYDROXY (VIT D DEFICIENCY, FRACTURES): Vit D, 25-Hydroxy: 29 ng/mL — ABNORMAL LOW (ref 30–100)

## 2020-01-06 LAB — MICROALBUMIN, URINE: Microalb, Ur: 89.9 mg/dL

## 2020-01-09 ENCOUNTER — Other Ambulatory Visit: Payer: Self-pay | Admitting: Nurse Practitioner

## 2020-01-09 ENCOUNTER — Ambulatory Visit (INDEPENDENT_AMBULATORY_CARE_PROVIDER_SITE_OTHER): Payer: 59 | Admitting: Family Medicine

## 2020-01-09 ENCOUNTER — Other Ambulatory Visit: Payer: Self-pay

## 2020-01-09 ENCOUNTER — Encounter: Payer: Self-pay | Admitting: Family Medicine

## 2020-01-09 VITALS — BP 114/60 | HR 62 | Temp 98.4°F | Resp 14 | Ht 67.0 in | Wt 281.0 lb

## 2020-01-09 DIAGNOSIS — D649 Anemia, unspecified: Secondary | ICD-10-CM

## 2020-01-09 DIAGNOSIS — N1831 Chronic kidney disease, stage 3a: Secondary | ICD-10-CM

## 2020-01-09 DIAGNOSIS — R3915 Urgency of urination: Secondary | ICD-10-CM | POA: Diagnosis not present

## 2020-01-09 DIAGNOSIS — R001 Bradycardia, unspecified: Secondary | ICD-10-CM

## 2020-01-09 DIAGNOSIS — I10 Essential (primary) hypertension: Secondary | ICD-10-CM | POA: Diagnosis not present

## 2020-01-09 DIAGNOSIS — N183 Chronic kidney disease, stage 3 unspecified: Secondary | ICD-10-CM

## 2020-01-09 DIAGNOSIS — E559 Vitamin D deficiency, unspecified: Secondary | ICD-10-CM

## 2020-01-09 DIAGNOSIS — E119 Type 2 diabetes mellitus without complications: Secondary | ICD-10-CM | POA: Diagnosis not present

## 2020-01-09 LAB — URINALYSIS, ROUTINE W REFLEX MICROSCOPIC
Bacteria, UA: NONE SEEN /HPF
Bilirubin Urine: NEGATIVE
Glucose, UA: NEGATIVE
Hyaline Cast: NONE SEEN /LPF
Ketones, ur: NEGATIVE
Leukocytes,Ua: NEGATIVE
Nitrite: NEGATIVE
Specific Gravity, Urine: 1.025 (ref 1.001–1.03)
WBC, UA: NONE SEEN /HPF (ref 0–5)
pH: 5.5 (ref 5.0–8.0)

## 2020-01-09 LAB — MICROSCOPIC MESSAGE

## 2020-01-09 MED ORDER — HYDROCHLOROTHIAZIDE 25 MG PO TABS
25.0000 mg | ORAL_TABLET | Freq: Every day | ORAL | 0 refills | Status: DC
Start: 2020-01-09 — End: 2020-02-01

## 2020-01-09 NOTE — Assessment & Plan Note (Signed)
Recheck BMET in 2 weeks Referral already in place for nephrology

## 2020-01-09 NOTE — Assessment & Plan Note (Signed)
Mild anemia, no black stools or gross bleeding Followed by GI She deferred colonoscopy due to covid screening at this time She will f/u with them

## 2020-01-09 NOTE — Assessment & Plan Note (Signed)
Her blood pressure has been up and down for the last couple weeks.  Her EKG was fairly unremarkable.  Discontinue the ACE inhibitor secondary to angioedema.  She has some fluid retention in the setting of prednisone which is most likely cause however I am concerned her blood pressure will go up if she continues to retain fluid symptoms start her on HCTZ 25 mg once a day for a week.  In her chart it was noted that she had a reaction to possible diuretic in the past this was about 15 or 16 years ago she was also on Seroquel at the same time so she is not sure if it was a true reaction.  Advised to take the HCTZ with a family member around okay she has had any hives difficulty breathing or reaction.

## 2020-01-09 NOTE — Progress Notes (Signed)
Subjective:    Patient ID: Cheryl Scott, female    DOB: [birth year], 49 y.o.   MRN: [id number]  Patient presents for ER F/U (possible rx to lisinopril, decreased HR)  Patient here to follow-up blood pressure.  She called the after-hours line last week.  At that time was having some swelling of her lip.  She is on ACE inhibitor.  Recommend that she go to the emergency room as it is not improved with Benadryl.  ACE inhibitor was discontinued Prednisone taper.  SHe also complained of some urinary urgency for a month that was getting progressively worse  concerning for urinary tract infection.  Urinalysis was obtained however there was no overwhelming symptoms for urinary tract infection.   He was seen by our nurse practitioner the day after she was in the emergency room notations were reviewed.  She was not started on any new blood pressure medicine.  It was recommended she complete her prednisone for the angioedema. Labs were obtained which showed a creatinine of 1.41 which is slightly above her baseline GFR was 50.  Liver function test were normal She had repeat urinalysis however she started her menstrual cycle she would like her urine rechecked today.  diabetes mellitus A1c was improved at 5.8% diet controlled  Mild anemia hemoglobin 11.2 baseline is between 11 and 12  BP at home  135-140/  , lowest 108/62   hig  HR typically 50-70 fr years  But last week HR down to 40's she had work up for bradycardia in April 2018 by cardiology. No pathology found She is not symptomatic   Vitamin D curring on 1000IU BID   [employer] she would like to resume taking this, if not interfering with her BP or kidney function  Review Of Systems:  GEN- denies fatigue, fever, weight loss,weakness, recent illness HEENT- denies eye drainage, change in vision, nasal discharge, CVS- denies chest pain, palpitations RESP- denies SOB, cough, wheeze ABD- denies N/V, change in stools, abd pain GU- denies  dysuria, hematuria, dribbling, incontinence MSK- denies joint pain, muscle aches, injury Neuro- denies headache, dizziness, syncope, seizure activity       Objective:    BP 114/60 (BP Location: Right Arm, Patient Position: Sitting, Cuff Size: Large)   Pulse 62   Temp 98.4 F (36.9 C) (Temporal)   Resp 14   Ht 5\' 7"  (1.702 m)   Wt 281 lb (127.5 kg)   SpO2 97%   BMI 44.01 kg/m  GEN- NAD, alert and oriented x3 ,weight up 5lbs  HEENT- PERRL, EOMI, non injected sclera, pink conjunctiva, MMM, oropharynx clear Neck- Supple, no thyromegaly CVS- RRR, no murmur RESP-CTAB ABD-NABS,soft,NT,ND, no CVA tenderness  EXT- MIld non pitting  Edema to shins bilat  Pulses- Radial, DP- 2+   EKG- NSR, bradycardia      Assessment & Plan:      Problem List Items Addressed This Visit      Unprioritized   CKD (chronic kidney disease) stage 3, GFR 30-59 ml/min    Recheck BMET in 2 weeks Referral already in place for nephrology       Diabetes mellitus type 2, controlled, without complications ([place])    Diabetes is currently diet controlled.  Her lipids however has gone up over the past year.  Work on dietary changes handout was given.  Repeat her lipids in 3 months after physical decide on statin drug as she does have increased cardiovascular risk.      Hypertension - Primary (  Chronic)    Her blood pressure has been up and down for the last couple weeks.  Her EKG was fairly unremarkable.  Discontinue the ACE inhibitor secondary to angioedema.  She has some fluid retention in the setting of prednisone which is most likely cause however I am concerned her blood pressure will go up if she continues to retain fluid symptoms start her on HCTZ 25 mg once a day for a week.  In her chart it was noted that she had a reaction to possible diuretic in the past this was about 15 or 16 years ago she was also on Seroquel at the same time so she is not sure if it was a true reaction.  Advised to take the HCTZ with  a family member around okay she has had any hives difficulty breathing or reaction.      Relevant Medications   hydrochlorothiazide (HYDRODIURIL) 25 MG tablet   Mild anemia    Mild anemia, no black stools or gross bleeding Followed by GI She deferred colonoscopy due to covid screening at this time She will f/u with them      Vitamin D deficiency    Increase vitamin D to 2000IU BID       Other Visit Diagnoses    Urinary urgency       Relevant Orders   Urinalysis, Routine w reflex microscopic (Completed)   Bradycardia       Relevant Orders   EKG 12-Lead (Completed)      Note: This dictation was prepared with Dragon dictation along with smaller phrase technology. Any transcriptional errors that result from this process are unintentional.

## 2020-01-09 NOTE — Progress Notes (Signed)
1. Total cholesterol and bad cholesterol a bit high. Diet and exercise could reduce. We can recheck in 3 months.   2. Mild microcytic anemia looks chronic over past 6 years at least, continue treatments of MVI most likely of chronic disease: CKD.   3. I do not see that she has a kidney specialist so I have referred.   4.Dm2 controlled continue diet low carb low sugar, exercise 20 minute at least 4 times per week.

## 2020-01-09 NOTE — Patient Instructions (Addendum)
Referral to kidney doctor  Take HCTZ once a day for 1 week  Call me if BP > 140/90 RECHECK BMET -LAB VISIT in 2 weeks  F/U 3 months for PHYSICAL    High Cholesterol  High cholesterol is a condition in which the blood has high levels of a white, waxy, fat-like substance (cholesterol). The human body needs small amounts of cholesterol. The liver makes all the cholesterol that the body needs. Extra (excess) cholesterol comes from the food that we eat. Cholesterol is carried from the liver by the blood through the blood vessels. If you have high cholesterol, deposits (plaques) may build up on the walls of your blood vessels (arteries). Plaques make the arteries narrower and stiffer. Cholesterol plaques increase your risk for heart attack and stroke. Work with your health care provider to keep your cholesterol levels in a healthy range. What increases the risk? This condition is more likely to develop in people who:  Eat foods that are high in animal fat (saturated fat) or cholesterol.  Are overweight.  Are not getting enough exercise.  Have a family history of high cholesterol. What are the signs or symptoms? There are no symptoms of this condition. How is this diagnosed? This condition may be diagnosed from the results of a blood test.  If you are older than age 6, your health care provider may check your cholesterol every 4-6 years.  You may be checked more often if you already have high cholesterol or other risk factors for heart disease. The blood test for cholesterol measures:  "Bad" cholesterol (LDL cholesterol). This is the main type of cholesterol that causes heart disease. The desired level for LDL is less than 100.  "Good" cholesterol (HDL cholesterol). This type helps to protect against heart disease by cleaning the arteries and carrying the LDL away. The desired level for HDL is 60 or higher.  Triglycerides. These are fats that the body can store or burn for energy. The  desired number for triglycerides is lower than 150.  Total cholesterol. This is a measure of the total amount of cholesterol in your blood, including LDL cholesterol, HDL cholesterol, and triglycerides. A healthy number is less than 200. How is this treated? This condition is treated with diet changes, lifestyle changes, and medicines. Diet changes  This may include eating more whole grains, fruits, vegetables, nuts, and fish.  This may also include cutting back on red meat and foods that have a lot of added sugar. Lifestyle changes  Changes may include getting at least 40 minutes of aerobic exercise 3 times a week. Aerobic exercises include walking, biking, and swimming. Aerobic exercise along with a healthy diet can help you maintain a healthy weight.  Changes may also include quitting smoking. Medicines  Medicines are usually given if diet and lifestyle changes have failed to reduce your cholesterol to healthy levels.  Your health care provider may prescribe a statin medicine. Statin medicines have been shown to reduce cholesterol, which can reduce the risk of heart disease. Follow these instructions at home: Eating and drinking If told by your health care provider:  Eat chicken (without skin), fish, veal, shellfish, ground [place] breast, and round or loin cuts of red meat.  Do not eat fried foods or fatty meats, such as hot dogs and salami.  Eat plenty of fruits, such as apples.  Eat plenty of vegetables, such as broccoli, potatoes, and carrots.  Eat beans, peas, and lentils.  Eat grains such as barley, rice, couscous,  and bulgur wheat.  Eat pasta without cream sauces.  Use skim or nonfat milk, and eat low-fat or nonfat yogurt and cheeses.  Do not eat or drink whole milk, cream, ice cream, egg yolks, or hard cheeses.  Do not eat stick margarine or tub margarines that contain trans fats (also called partially hydrogenated oils).  Do not eat saturated tropical oils, such  as coconut oil and palm oil.  Do not eat cakes, cookies, crackers, or other baked goods that contain trans fats.  General instructions  Exercise as directed by your health care provider. Increase your activity level with activities such as gardening, walking, and taking the stairs.  Take over-the-counter and prescription medicines only as told by your health care provider.  Do not use any products that contain nicotine or tobacco, such as cigarettes and e-cigarettes. If you need help quitting, ask your health care provider.  Keep all follow-up visits as told by your health care provider. This is important. Contact a health care provider if:  You are struggling to maintain a healthy diet or weight.  You need help to start on an exercise program.  You need help to stop smoking. Get help right away if:  You have chest pain.  You have trouble breathing. This information is not intended to replace advice given to you by your health care provider. Make sure you discuss any questions you have with your health care provider. Document Revised: 07/17/2017 Document Reviewed: 01/12/2016 Elsevier Patient Education  [place].

## 2020-01-09 NOTE — Assessment & Plan Note (Signed)
Diabetes is currently diet controlled.  Her lipids however has gone up over the past year.  Work on dietary changes handout was given.  Repeat her lipids in 3 months after physical decide on statin drug as she does have increased cardiovascular risk.

## 2020-01-09 NOTE — Assessment & Plan Note (Signed)
Increase vitamin D to 2000IU BID

## 2020-01-11 ENCOUNTER — Ambulatory Visit: Payer: 59 | Admitting: Family Medicine

## 2020-01-17 ENCOUNTER — Other Ambulatory Visit: Payer: Self-pay | Admitting: Family Medicine

## 2020-01-24 ENCOUNTER — Telehealth: Payer: Self-pay | Admitting: Family Medicine

## 2020-01-24 NOTE — Telephone Encounter (Signed)
Call pt I reviewed the sea moss supplement It can interfere with thyroid function, blood thinners, some BP meds  So far her meds look okay  If she takes the supplement regularly I would recommend a thyroid check yearly

## 2020-01-24 NOTE — Telephone Encounter (Signed)
-----   Message from Alycia Rossetti, MD sent at 01/09/2020  4:38 PM EDT ----- Regarding: f/u sEA MOSS

## 2020-01-25 NOTE — Telephone Encounter (Signed)
Patient aware per MyChart.   

## 2020-01-31 ENCOUNTER — Other Ambulatory Visit: Payer: Self-pay | Admitting: Family Medicine

## 2020-02-01 ENCOUNTER — Telehealth: Payer: Self-pay

## 2020-02-01 NOTE — Telephone Encounter (Signed)
Pt called to report that her bp is running high. Pt has not kept a bp log but states it has been in the 140s over 90s since July 2nd. Please advise.

## 2020-02-03 MED ORDER — AMLODIPINE BESYLATE 5 MG PO TABS
5.0000 mg | ORAL_TABLET | Freq: Every day | ORAL | 0 refills | Status: DC
Start: 2020-02-03 — End: 2020-02-28

## 2020-02-03 NOTE — Telephone Encounter (Signed)
Add norvasc 5mg  once a day Continue HCTZ Schedule OV in 2-3 weeks

## 2020-02-03 NOTE — Telephone Encounter (Signed)
Pt called back. Verbalizes understanding. 

## 2020-02-03 NOTE — Telephone Encounter (Signed)
Call placed to patient. Vayas.   Prescription sent to pharmacy.

## 2020-02-25 ENCOUNTER — Other Ambulatory Visit: Payer: Self-pay | Admitting: Family Medicine

## 2020-03-09 ENCOUNTER — Ambulatory Visit: Payer: 59

## 2020-03-09 ENCOUNTER — Other Ambulatory Visit: Payer: Self-pay

## 2020-03-09 VITALS — BP 140/82

## 2020-03-09 DIAGNOSIS — I10 Essential (primary) hypertension: Secondary | ICD-10-CM

## 2020-03-09 NOTE — Progress Notes (Signed)
Pt came in for a bp check. Pt reported edema in legs and ankles. I advised pt to schedule an appt with Dr. [name]. Pt is scheduled for 08/16.

## 2020-03-12 ENCOUNTER — Ambulatory Visit: Payer: 59 | Admitting: Family Medicine

## 2020-03-12 ENCOUNTER — Other Ambulatory Visit: Payer: Self-pay

## 2020-03-12 ENCOUNTER — Encounter: Payer: Self-pay | Admitting: Family Medicine

## 2020-03-12 VITALS — BP 152/96 | HR 52 | Temp 97.8°F | Resp 18 | Ht 67.0 in | Wt 279.8 lb

## 2020-03-12 DIAGNOSIS — M2550 Pain in unspecified joint: Secondary | ICD-10-CM | POA: Diagnosis not present

## 2020-03-12 DIAGNOSIS — N1831 Chronic kidney disease, stage 3a: Secondary | ICD-10-CM

## 2020-03-12 DIAGNOSIS — R609 Edema, unspecified: Secondary | ICD-10-CM | POA: Diagnosis not present

## 2020-03-12 DIAGNOSIS — I10 Essential (primary) hypertension: Secondary | ICD-10-CM

## 2020-03-12 LAB — COMPLETE METABOLIC PANEL WITH GFR
AG Ratio: 1.2 (calc) (ref 1.0–2.5)
ALT: 12 U/L (ref 6–29)
AST: 15 U/L (ref 10–35)
Albumin: 3.9 g/dL (ref 3.6–5.1)
Alkaline phosphatase (APISO): 84 U/L (ref 37–153)
BUN/Creatinine Ratio: 11 (calc) (ref 6–22)
BUN: 16 mg/dL (ref 7–25)
CO2: 29 mmol/L (ref 20–32)
Calcium: 9.1 mg/dL (ref 8.6–10.4)
Chloride: 98 mmol/L (ref 98–110)
Creat: 1.46 mg/dL — ABNORMAL HIGH (ref 0.50–1.05)
GFR, Est African American: 48 mL/min/{1.73_m2} — ABNORMAL LOW (ref >=60)
GFR, Est Non African American: 41 mL/min/{1.73_m2} — ABNORMAL LOW (ref >=60)
Globulin: 3.2 g/dL (calc) (ref 1.9–3.7)
Glucose, Bld: 118 mg/dL — ABNORMAL HIGH (ref 65–99)
Potassium: 3.5 mmol/L (ref 3.5–5.3)
Sodium: 137 mmol/L (ref 135–146)
Total Bilirubin: 0.3 mg/dL (ref 0.2–1.2)
Total Protein: 7.1 g/dL (ref 6.1–8.1)

## 2020-03-12 LAB — URIC ACID: Uric Acid, Serum: 7 mg/dL (ref 2.5–7.0)

## 2020-03-12 MED ORDER — CETIRIZINE HCL 10 MG PO TABS: 10.0000 mg | ORAL_TABLET | Freq: Every day | ORAL | 1 refills | Status: DC

## 2020-03-12 MED ORDER — AMLODIPINE BESYLATE 5 MG PO TABS: 5.0000 mg | ORAL_TABLET | Freq: Every day | ORAL | 1 refills | Status: DC

## 2020-03-12 MED ORDER — FUROSEMIDE 40 MG PO TABS
ORAL_TABLET | ORAL | 3 refills | Status: DC
Start: 2020-03-12 — End: 2020-06-04

## 2020-03-12 NOTE — Progress Notes (Signed)
   Subjective:    Patient ID: Cheryl Scott, female    DOB: [birth year], 49 y.o.   MRN: [id number]  Patient presents for Hypertension (feels swollen )   Pt here to f/u HTN, feels swollen all over  She was taken off ACEI due to angiodema   She started having headaches at home and the past few days they have increased swelling in her hands and feet. BP at home has been 140's/ 70-80's   Taking HCTZ 25mg  and norvasc 5mg    No shortness of breath no chest pain.  She does admit that she has not been eating the healthiest food/snacks    She also states she is followed with orthopedics they were concerned about possible gout.  States that there was any uric acid level drawn last year when she was getting injections it was never done.  She gets pain in both knees and ankles.  Review Of Systems:  GEN- denies fatigue, fever, weight loss,weakness, recent illness HEENT- denies eye drainage, change in vision, nasal discharge, CVS- denies chest pain, palpitations RESP- denies SOB, cough, wheeze ABD- denies N/V, change in stools, abd pain GU- denies dysuria, hematuria, dribbling, incontinence MSK- + joint pain, muscle aches, injury Neuro- denies headache, dizziness, syncope, seizure activity       Objective:    BP (!) 152/96   Pulse (!) 52   Temp 97.8 F (36.6 C)   Resp 18   Ht 5\' 7"  (1.702 m)   Wt 279 lb 12.8 oz (126.9 kg)   SpO2 96%   BMI 43.82 kg/m  GEN- NAD, alert and oriented x3 HEENT- PERRL, EOMI, non injected sclera, pink conjunctiva, MMM, oropharynx clear Neck- Supple, no thyromegaly , no JVD  CVS- RRR, no murmur RESP-CTAB ABD-NABS,soft,NT,ND EXT- non pitting pedal edema , mild swelling hands  Pulses- Radial, DP- 2+        Assessment & Plan:      Problem List Items Addressed This Visit      Unprioritized   CKD (chronic kidney disease) stage 3, GFR 30-59 ml/min    She was referred to nephrology but never heard back tearful place another referral.      Relevant  Orders   COMPLETE METABOLIC PANEL WITH GFR (Completed)   Ambulatory referral to Nephrology   Hypertension - Primary (Chronic)    Blood pressure uncontrolled patient also with fluid retention.  Will discontinue HCTZ as she tolerates this without any allergic reaction we will give her Lasix 40 mg once a day.  She will continue the amlodipine.  We will trend her blood pressure and weights at home.  May need to increase the amlodipine but like to see what her body does with getting off some of the fluid.  Discussed dietary changes to reduce sodium in the diet and increase her water.  Check a uric acid for polyarthralgia.      Relevant Medications   furosemide (LASIX) 40 MG tablet   amLODipine (NORVASC) 5 MG tablet   Other Relevant Orders   Uric Acid (Completed)   Ambulatory referral to Nephrology   Peripheral edema   Relevant Orders   Ambulatory referral to Nephrology    Other Visit Diagnoses    Polyarthralgia       Relevant Orders   Uric Acid (Completed)      Note: This dictation was prepared with Dragon dictation along with smaller phrase technology. Any transcriptional errors that result from this process are unintentional.

## 2020-03-12 NOTE — Patient Instructions (Addendum)
Stop the HCTZ Take lasix 40mg  once a day for fluid  Take the norvasc  F/U as previous

## 2020-03-13 ENCOUNTER — Encounter: Payer: Self-pay | Admitting: Family Medicine

## 2020-03-13 DIAGNOSIS — R609 Edema, unspecified: Secondary | ICD-10-CM | POA: Insufficient documentation

## 2020-03-13 NOTE — Assessment & Plan Note (Signed)
Blood pressure uncontrolled patient also with fluid retention.  Will discontinue HCTZ as she tolerates this without any allergic reaction we will give her Lasix 40 mg once a day.  She will continue the amlodipine.  We will trend her blood pressure and weights at home.  May need to increase the amlodipine but like to see what her body does with getting off some of the fluid.  Discussed dietary changes to reduce sodium in the diet and increase her water.  Check a uric acid for polyarthralgia.

## 2020-03-13 NOTE — Assessment & Plan Note (Signed)
She was referred to nephrology but never heard back tearful place another referral.

## 2020-04-11 ENCOUNTER — Other Ambulatory Visit: Payer: Self-pay

## 2020-04-11 ENCOUNTER — Ambulatory Visit (INDEPENDENT_AMBULATORY_CARE_PROVIDER_SITE_OTHER): Payer: 59 | Admitting: Family Medicine

## 2020-04-11 ENCOUNTER — Encounter: Payer: Self-pay | Admitting: Family Medicine

## 2020-04-11 VITALS — BP 134/70 | HR 70 | Temp 98.0°F | Resp 14 | Ht 67.0 in | Wt 277.0 lb

## 2020-04-11 DIAGNOSIS — Z9989 Dependence on other enabling machines and devices: Secondary | ICD-10-CM

## 2020-04-11 DIAGNOSIS — D649 Anemia, unspecified: Secondary | ICD-10-CM

## 2020-04-11 DIAGNOSIS — Z86010 Personal history of colonic polyps: Secondary | ICD-10-CM

## 2020-04-11 DIAGNOSIS — N1831 Chronic kidney disease, stage 3a: Secondary | ICD-10-CM

## 2020-04-11 DIAGNOSIS — G4733 Obstructive sleep apnea (adult) (pediatric): Secondary | ICD-10-CM

## 2020-04-11 DIAGNOSIS — Z1159 Encounter for screening for other viral diseases: Secondary | ICD-10-CM

## 2020-04-11 DIAGNOSIS — I10 Essential (primary) hypertension: Secondary | ICD-10-CM

## 2020-04-11 DIAGNOSIS — Z0001 Encounter for general adult medical examination with abnormal findings: Secondary | ICD-10-CM

## 2020-04-11 DIAGNOSIS — Z860101 Personal history of adenomatous and serrated colon polyps: Secondary | ICD-10-CM

## 2020-04-11 DIAGNOSIS — Z1211 Encounter for screening for malignant neoplasm of colon: Secondary | ICD-10-CM

## 2020-04-11 DIAGNOSIS — E66813 Obesity, class 3: Secondary | ICD-10-CM

## 2020-04-11 DIAGNOSIS — E119 Type 2 diabetes mellitus without complications: Secondary | ICD-10-CM

## 2020-04-11 DIAGNOSIS — Z0000 Encounter for general adult medical examination without abnormal findings: Secondary | ICD-10-CM

## 2020-04-11 MED ORDER — AMLODIPINE BESYLATE 10 MG PO TABS: 10.0000 mg | ORAL_TABLET | Freq: Every day | ORAL | 1 refills | Status: DC

## 2020-04-11 NOTE — Assessment & Plan Note (Signed)
Recheck renal function. ?

## 2020-04-11 NOTE — Assessment & Plan Note (Signed)
Benefits from CPAP use New orders to be written for supplies

## 2020-04-11 NOTE — Patient Instructions (Addendum)
Schedule your mammogram with your GYN Call the kidney doctor  Colonoscopy referral  We will call with lab results  Increase norvasc to 10mg  Continue lasix 40mg  once a day  F/U 4 months

## 2020-04-11 NOTE — Assessment & Plan Note (Signed)
Uncontrolled, increase norvasc to 10mg  once a day  Restart lasix 40mg  once a day

## 2020-04-11 NOTE — Progress Notes (Signed)
   Subjective:    Patient ID: Cheryl Scott, female    DOB: Sep 30, 1970, 49 y.o.   MRN: [id number]  Patient presents for Annual Exam (is not fasting)   Pt here for CPE  [facility], had PAP Smear 3 months ago  she knows she has fibroids Mammogram has been rescheduled    HTN - bp still u[ and dowm , yesterday 163/88, lowest 676'H systolic   she was taken off HCTZ started, lasix 40mg  , swelling improved with lasix, she is now back on HCTZ  CKD- she had to cancel her appt since her child was sick, needs to reschedule Sister has CKD STAGE 5 NOW    OSA- needs new machine   Colonoscopy due last in  2014   Eye doctor- [employer] [place] vaccine #1 done in August, 2nd vaccine in 1 week   Review Of Systems:  GEN- denies fatigue, fever, weight loss,weakness, recent illness HEENT- denies eye drainage, change in vision, nasal discharge, CVS- denies chest pain, palpitations RESP- denies SOB, cough, wheeze ABD- denies N/V, change in stools, abd pain GU- denies dysuria, hematuria, dribbling, incontinence MSK- denies joint pain, muscle aches, injury Neuro- denies headache, dizziness, syncope, seizure activity       Objective:    BP 134/70   Pulse 70   Temp 98 F (36.7 C) (Temporal)   Resp 14   Ht 5\' 7"  (1.702 m)   Wt 277 lb (125.6 kg)   SpO2 98%   BMI 43.38 kg/m  GEN- NAD, alert and oriented x3 HEENT- PERRL, EOMI, non injected sclera, pink conjunctiva, MMM, oropharynx clear, TM clear no effusion  Neck- Supple, no thyromegaly CVS- RRR, no murmur RESP-CTAB ABD-NABS,soft,NT,ND EXT- non pitting pedal edema Pulses- Radial, DP- 2+        Assessment & Plan:      Problem List Items Addressed This Visit      Unprioritized   CKD (chronic kidney disease) stage 3, GFR 30-59 ml/min    Recheck renal function       Diabetes mellitus type 2, controlled, without complications (HCC)   Hypertension (Chronic)    Uncontrolled, increase  norvasc to 10mg  once a day  Restart lasix 40mg  once a day       Relevant Medications   amLODipine (NORVASC) 10 MG tablet   Other Relevant Orders   COMPLETE METABOLIC PANEL WITH GFR   Mild anemia   Obesity, Class III, BMI 40-49.9 (morbid obesity) (HCC)   OSA on CPAP (Chronic)    Benefits from CPAP use New orders to be written for supplies        Other Visit Diagnoses    Routine general medical examination at a health care facility    -  Primary   CPE done, fasting labs, will get 2nd covid vaccine, then get flu shot    Colon cancer screening       Relevant Orders   Ambulatory referral to Gastroenterology   History of adenomatous polyp of colon       Relevant Orders   Ambulatory referral to Gastroenterology   Need for hepatitis C screening test       Relevant Orders   Hepatitis C antibody      Note: This dictation was prepared with Dragon dictation along with smaller phrase technology. Any transcriptional errors that result from this process are unintentional.

## 2020-04-12 LAB — COMPLETE METABOLIC PANEL WITH GFR
AG Ratio: 1.3 (calc) (ref 1.0–2.5)
ALT: 14 U/L (ref 6–29)
AST: 15 U/L (ref 10–35)
Albumin: 4 g/dL (ref 3.6–5.1)
Alkaline phosphatase (APISO): 88 U/L (ref 37–153)
BUN/Creatinine Ratio: 7 (calc) (ref 6–22)
BUN: 9 mg/dL (ref 7–25)
CO2: 28 mmol/L (ref 20–32)
Calcium: 8.7 mg/dL (ref 8.6–10.4)
Chloride: 103 mmol/L (ref 98–110)
Creat: 1.36 mg/dL — ABNORMAL HIGH (ref 0.50–1.05)
GFR, Est African American: 52 mL/min/{1.73_m2} — ABNORMAL LOW (ref >=60)
GFR, Est Non African American: 45 mL/min/{1.73_m2} — ABNORMAL LOW (ref >=60)
Globulin: 3.1 g/dL (calc) (ref 1.9–3.7)
Glucose, Bld: 134 mg/dL — ABNORMAL HIGH (ref 65–99)
Potassium: 3.8 mmol/L (ref 3.5–5.3)
Sodium: 141 mmol/L (ref 135–146)
Total Bilirubin: 0.3 mg/dL (ref 0.2–1.2)
Total Protein: 7.1 g/dL (ref 6.1–8.1)

## 2020-04-12 LAB — HEPATITIS C ANTIBODY
Hepatitis C Ab: NONREACTIVE
SIGNAL TO CUT-OFF: 0.01 (ref <1.00)

## 2020-04-16 ENCOUNTER — Encounter: Payer: Self-pay | Admitting: Internal Medicine

## 2020-04-16 ENCOUNTER — Telehealth: Payer: Self-pay | Admitting: *Deleted

## 2020-04-16 NOTE — Telephone Encounter (Signed)
-----   Message from Alycia Rossetti, MD sent at 04/15/2020  9:11 AM EDT ----- Regarding: CPAP Supplies   Call pt or mychart   I can not find her last sleep study, I am assuming it was more than 5 years ago, and doesn't look like she has a sleep doctor that has been following.   I can not accurately fill out forms for [facility] for new equipment because I dont have this information.  If she knows where she had sleep study we can try to track it down, if > 5 years I recommend split night study to get updated CPAP settings , then we can order new supplies  Dr. [name]

## 2020-04-26 ENCOUNTER — Other Ambulatory Visit: Payer: Self-pay | Admitting: Family Medicine

## 2020-05-06 ENCOUNTER — Other Ambulatory Visit: Payer: Self-pay | Admitting: Family Medicine

## 2020-05-09 ENCOUNTER — Ambulatory Visit: Payer: 59 | Admitting: Family Medicine

## 2020-05-16 ENCOUNTER — Ambulatory Visit: Payer: 59 | Admitting: Family

## 2020-05-17 ENCOUNTER — Ambulatory Visit: Payer: 59 | Admitting: Internal Medicine

## 2020-05-21 ENCOUNTER — Encounter: Payer: Self-pay | Admitting: Family Medicine

## 2020-05-21 ENCOUNTER — Ambulatory Visit (INDEPENDENT_AMBULATORY_CARE_PROVIDER_SITE_OTHER): Payer: 59 | Admitting: Family Medicine

## 2020-05-21 ENCOUNTER — Other Ambulatory Visit: Payer: Self-pay

## 2020-05-21 VITALS — BP 150/84 | HR 61 | Temp 98.2°F | Ht 67.0 in | Wt 274.6 lb

## 2020-05-21 DIAGNOSIS — M545 Low back pain, unspecified: Secondary | ICD-10-CM

## 2020-05-21 DIAGNOSIS — I10 Essential (primary) hypertension: Secondary | ICD-10-CM | POA: Diagnosis not present

## 2020-05-21 DIAGNOSIS — N3000 Acute cystitis without hematuria: Secondary | ICD-10-CM

## 2020-05-21 DIAGNOSIS — G8929 Other chronic pain: Secondary | ICD-10-CM

## 2020-05-21 LAB — URINALYSIS, ROUTINE W REFLEX MICROSCOPIC
Bilirubin Urine: NEGATIVE
Hyaline Cast: NONE SEEN /LPF
Specific Gravity, Urine: 1.019 (ref 1.001–1.03)

## 2020-05-21 MED ORDER — SULFAMETHOXAZOLE-TRIMETHOPRIM 800-160 MG PO TABS: 1.0000 | ORAL_TABLET | Freq: Two times a day (BID) | ORAL | 0 refills | Status: DC

## 2020-05-21 MED ORDER — FLUCONAZOLE 150 MG PO TABS
150.0000 mg | ORAL_TABLET | Freq: Once | ORAL | 0 refills | Status: AC
Start: 2020-05-21 — End: 2020-05-21

## 2020-05-21 MED ORDER — HYDROCODONE-ACETAMINOPHEN 5-325 MG PO TABS: ORAL_TABLET | ORAL | 0 refills | Status: DC

## 2020-05-21 NOTE — Patient Instructions (Addendum)
Increase norvasc to  7.5mg  once a day   Hold lasix 2-3 days  Use pain medication Call if not improved
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
## 2020-05-21 NOTE — Assessment & Plan Note (Signed)
Increase norvasc to  7.5mg  once a day

## 2020-05-28 ENCOUNTER — Telehealth: Payer: Self-pay | Admitting: *Deleted

## 2020-05-28 NOTE — Telephone Encounter (Signed)
Received VM from patient.   Reports that she attempted to take Bactrim, but noted increased itching with medication. States that she stopped taking the medication.   Call placed to patient to F/U. [facility].

## 2020-06-04 ENCOUNTER — Other Ambulatory Visit: Payer: Self-pay | Admitting: Family Medicine

## 2020-06-04 ENCOUNTER — Ambulatory Visit: Payer: 59 | Admitting: Physician Assistant

## 2020-06-04 NOTE — Telephone Encounter (Signed)
There is a high allergy reaction warning to the medication.  @ last OV on 05/21/20 "Hold lasix 2-3 days "   Please advise if med refill is appropriate.  Patient is requesting a refill of the following medications: Requested Prescriptions   Pending Prescriptions Disp Refills  . furosemide (LASIX) 40 MG tablet [Pharmacy Med Name: FUROSEMIDE 40 MG TABLET] 90 tablet 1    Sig: TAKE 1 TABLET BY MOUTH EVERY DAY AS NEEDED    Date of patient request: 06/04/20 Last office visit: 05/21/20 Date of last refill: 03/12/20 Last refill amount: 30 + 3 Follow up time period per chart: None Scheduled Yet

## 2020-06-05 ENCOUNTER — Ambulatory Visit (INDEPENDENT_AMBULATORY_CARE_PROVIDER_SITE_OTHER): Payer: 59 | Admitting: Physician Assistant

## 2020-06-05 ENCOUNTER — Ambulatory Visit: Payer: Self-pay

## 2020-06-05 ENCOUNTER — Encounter: Payer: Self-pay | Admitting: Physician Assistant

## 2020-06-05 ENCOUNTER — Other Ambulatory Visit: Payer: Self-pay | Admitting: Physician Assistant

## 2020-06-05 DIAGNOSIS — M5442 Lumbago with sciatica, left side: Secondary | ICD-10-CM | POA: Diagnosis not present

## 2020-06-05 DIAGNOSIS — M5441 Lumbago with sciatica, right side: Secondary | ICD-10-CM

## 2020-06-05 DIAGNOSIS — M25562 Pain in left knee: Secondary | ICD-10-CM

## 2020-06-05 DIAGNOSIS — M542 Cervicalgia: Secondary | ICD-10-CM

## 2020-06-05 DIAGNOSIS — M25561 Pain in right knee: Secondary | ICD-10-CM | POA: Diagnosis not present

## 2020-06-05 MED ORDER — PREDNISONE 10 MG PO TABS: 20.0000 mg | ORAL_TABLET | Freq: Every day | ORAL | 0 refills | Status: DC

## 2020-06-05 MED ORDER — METHOCARBAMOL 500 MG PO TABS: 500.0000 mg | ORAL_TABLET | Freq: Four times a day (QID) | ORAL | 0 refills | Status: DC | PRN
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
## 2020-06-06 ENCOUNTER — Other Ambulatory Visit: Payer: Self-pay

## 2020-06-06 ENCOUNTER — Telehealth: Payer: Self-pay | Admitting: Orthopedic Surgery

## 2020-06-06 ENCOUNTER — Telehealth: Payer: Self-pay

## 2020-06-06 MED ORDER — AMLODIPINE BESYLATE 10 MG PO TABS
10.0000 mg | ORAL_TABLET | Freq: Every day | ORAL | 1 refills | Status: DC
Start: 2020-06-06 — End: 2020-12-22

## 2020-06-06 NOTE — Telephone Encounter (Signed)
Continue novasc 10mg  once a day  Continue LASIX daily  The prednisone she was put on by ortho will raise her BP as well So avoid salty foods, fried foods  Check BP at least 1 hour after meds taken in the morning  Send readings in 1 week
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
## 2020-06-06 NOTE — Telephone Encounter (Signed)
Spoke with pt and she stated that she has been taking 2 of the 5mg  of Amlodipine and her Bp has been running in the 170/180. I contacted the pharmacy to see if they have the dosage change from the 5 to 10mg  but they did not so I sent in the 10mg  of Almodipine.  Please advise

## 2020-06-06 NOTE — Telephone Encounter (Signed)
I called and lm on vm to advise of below.

## 2020-06-06 NOTE — Telephone Encounter (Signed)
Do you want to dictate this note? See message below.
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
## 2020-06-07 ENCOUNTER — Telehealth: Payer: Self-pay

## 2020-06-07 NOTE — Telephone Encounter (Signed)
Pt called and stated that the lowest that she has gotten her Bp dw to is 157/98. It has been running 170-180/100 and she is still not feeling good.   Please Advise

## 2020-06-08 MED ORDER — METOPROLOL TARTRATE 25 MG PO TABS: 25.0000 mg | ORAL_TABLET | Freq: Two times a day (BID) | ORAL | 3 refills | Status: DC

## 2020-06-08 NOTE — Telephone Encounter (Signed)
Add metoprolol 25mg  twice a day Continue norvasc 10mg   Lasix  Schedule OV for next week

## 2020-06-08 NOTE — Telephone Encounter (Signed)
Please Advise

## 2020-06-08 NOTE — Telephone Encounter (Signed)
Call placed to patient. LMTRC.  

## 2020-06-08 NOTE — Addendum Note (Signed)
Addended by: Vic Blackbird F on: 06/08/2020 07:57 AM   Modules accepted: Orders

## 2020-06-11 NOTE — Telephone Encounter (Signed)
Call placed to patient. LMTRC.  

## 2020-06-13 NOTE — Telephone Encounter (Signed)
Multiple calls placed to patient with no answer and no return call.   Message to be closed.  

## 2020-06-14 ENCOUNTER — Telehealth: Payer: Self-pay

## 2020-06-14 NOTE — Telephone Encounter (Signed)
Please see below.

## 2020-06-14 NOTE — Telephone Encounter (Signed)
Patient called she stated forms from [employer] were sent here and to be filled out patient stated the forms were supposed to be sent back to [employer]) [phone]

## 2020-06-17 ENCOUNTER — Other Ambulatory Visit: Payer: Self-pay | Admitting: Family Medicine

## 2020-06-18 ENCOUNTER — Other Ambulatory Visit: Payer: Self-pay | Admitting: Family Medicine

## 2020-06-18 MED ORDER — HYDROCODONE-ACETAMINOPHEN 5-325 MG PO TABS: ORAL_TABLET | ORAL | 0 refills | Status: DC

## 2020-06-18 NOTE — Telephone Encounter (Signed)
Patient wants records to be sent to her insurance company. I emailed her an authorization form to compete.

## 2020-06-18 NOTE — Telephone Encounter (Signed)
Please Advise

## 2020-06-18 NOTE — Telephone Encounter (Signed)
Last refill 05-21-20

## 2020-06-25 ENCOUNTER — Telehealth: Payer: Self-pay
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
## 2020-06-29 ENCOUNTER — Ambulatory Visit: Payer: 59 | Admitting: Physician Assistant

## 2020-06-29 ENCOUNTER — Other Ambulatory Visit: Payer: Self-pay

## 2020-06-29 ENCOUNTER — Encounter: Payer: Self-pay | Admitting: Physician Assistant

## 2020-06-29 ENCOUNTER — Ambulatory Visit (INDEPENDENT_AMBULATORY_CARE_PROVIDER_SITE_OTHER): Payer: 59 | Admitting: Physician Assistant

## 2020-06-29 DIAGNOSIS — M5441 Lumbago with sciatica, right side: Secondary | ICD-10-CM | POA: Diagnosis not present

## 2020-06-29 DIAGNOSIS — M5442 Lumbago with sciatica, left side: Secondary | ICD-10-CM

## 2020-06-29 MED ORDER — HYDROCODONE-ACETAMINOPHEN 5-325 MG PO TABS: 1.0000 | ORAL_TABLET | ORAL | 0 refills | Status: DC | PRN
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
## 2020-06-30 ENCOUNTER — Other Ambulatory Visit: Payer: Self-pay | Admitting: Physician Assistant

## 2020-07-12 ENCOUNTER — Telehealth: Payer: Self-pay | Admitting: Physician Assistant

## 2020-07-17 ENCOUNTER — Telehealth: Payer: Self-pay | Admitting: Family Medicine

## 2020-07-17 MED ORDER — HYDROCODONE-ACETAMINOPHEN 5-325 MG PO TABS: 1.0000 | ORAL_TABLET | ORAL | 0 refills | Status: DC | PRN

## 2020-07-17 NOTE — Telephone Encounter (Signed)
Pt called after hours requeting pain med refill on norco Advised can not be filled after hours Would need to wait until AM  She is being seen by ortho for back pain Scheduled for MRI in Jan

## 2020-07-19 ENCOUNTER — Other Ambulatory Visit: Payer: Self-pay | Admitting: Nurse Practitioner

## 2020-07-19 DIAGNOSIS — A084 Viral intestinal infection, unspecified: Secondary | ICD-10-CM

## 2020-07-19 DIAGNOSIS — K219 Gastro-esophageal reflux disease without esophagitis: Secondary | ICD-10-CM

## 2020-07-19 DIAGNOSIS — R142 Eructation: Secondary | ICD-10-CM

## 2020-07-28 ENCOUNTER — Other Ambulatory Visit: Payer: Self-pay | Admitting: Family Medicine

## 2020-08-03 ENCOUNTER — Telehealth: Payer: Self-pay | Admitting: Family Medicine
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
## 2020-08-03 NOTE — Telephone Encounter (Signed)
LVM to sch same day appt home cell phone not able to leave message

## 2020-08-04 ENCOUNTER — Other Ambulatory Visit: Payer: 59

## 2020-08-06 ENCOUNTER — Ambulatory Visit: Payer: 59 | Admitting: Orthopedic Surgery

## 2020-08-20 NOTE — Telephone Encounter (Signed)
error 

## 2020-08-22 ENCOUNTER — Other Ambulatory Visit: Payer: Self-pay | Admitting: Family Medicine

## 2020-08-23 MED ORDER — HYDROCODONE-ACETAMINOPHEN 5-325 MG PO TABS: 1.0000 | ORAL_TABLET | ORAL | 0 refills | Status: DC | PRN

## 2020-08-30 ENCOUNTER — Other Ambulatory Visit: Payer: Self-pay | Admitting: Family Medicine

## 2020-09-01 ENCOUNTER — Other Ambulatory Visit: Payer: Self-pay | Admitting: Family Medicine

## 2020-09-04 ENCOUNTER — Encounter (HOSPITAL_COMMUNITY): Payer: Self-pay | Admitting: *Deleted

## 2020-09-04 ENCOUNTER — Other Ambulatory Visit: Payer: Self-pay

## 2020-09-04 ENCOUNTER — Telehealth: Payer: Self-pay | Admitting: *Deleted

## 2020-09-04 ENCOUNTER — Emergency Department (HOSPITAL_COMMUNITY)
Admission: EM | Admit: 2020-09-04 | Discharge: 2020-09-04 | Disposition: A | Discharge status: Home / Self Care | Payer: 59 | Attending: Emergency Medicine | Admitting: Emergency Medicine

## 2020-09-04 ENCOUNTER — Emergency Department (HOSPITAL_COMMUNITY): Payer: 59

## 2020-09-04 DIAGNOSIS — E1122 Type 2 diabetes mellitus with diabetic chronic kidney disease: Secondary | ICD-10-CM | POA: Insufficient documentation

## 2020-09-04 DIAGNOSIS — E876 Hypokalemia: Secondary | ICD-10-CM | POA: Insufficient documentation

## 2020-09-04 DIAGNOSIS — N183 Chronic kidney disease, stage 3 unspecified: Secondary | ICD-10-CM | POA: Diagnosis not present

## 2020-09-04 DIAGNOSIS — M79605 Pain in left leg: Secondary | ICD-10-CM | POA: Diagnosis not present

## 2020-09-04 DIAGNOSIS — I129 Hypertensive chronic kidney disease with stage 1 through stage 4 chronic kidney disease, or unspecified chronic kidney disease: Secondary | ICD-10-CM | POA: Insufficient documentation

## 2020-09-04 DIAGNOSIS — Z79899 Other long term (current) drug therapy: Secondary | ICD-10-CM | POA: Diagnosis not present

## 2020-09-04 DIAGNOSIS — Z87891 Personal history of nicotine dependence: Secondary | ICD-10-CM | POA: Insufficient documentation

## 2020-09-04 DIAGNOSIS — R079 Chest pain, unspecified: Secondary | ICD-10-CM | POA: Diagnosis present

## 2020-09-04 DIAGNOSIS — R0789 Other chest pain: Secondary | ICD-10-CM | POA: Insufficient documentation

## 2020-09-04 DIAGNOSIS — M79604 Pain in right leg: Secondary | ICD-10-CM | POA: Insufficient documentation

## 2020-09-04 DIAGNOSIS — Z8616 Personal history of COVID-19: Secondary | ICD-10-CM | POA: Diagnosis not present

## 2020-09-04 LAB — CBC
HCT: 37.5 % (ref 36.0–46.0)
Hemoglobin: 11.5 g/dL — ABNORMAL LOW (ref 12.0–15.0)
MCH: 26.6 pg (ref 26.0–34.0)
MCHC: 30.7 g/dL (ref 30.0–36.0)
MCV: 86.8 fL (ref 80.0–100.0)
Platelets: 224 10*3/uL (ref 150–400)
RBC: 4.32 MIL/uL (ref 3.87–5.11)
RDW: 15.4 % (ref 11.5–15.5)
WBC: 7.5 10*3/uL (ref 4.0–10.5)
nRBC: 0 % (ref 0.0–0.2)

## 2020-09-04 LAB — BASIC METABOLIC PANEL
Anion gap: 10 (ref 5–15)
BUN: 19 mg/dL (ref 6–20)
CO2: 25 mmol/L (ref 22–32)
Calcium: 8.6 mg/dL — ABNORMAL LOW (ref 8.9–10.3)
Chloride: 103 mmol/L (ref 98–111)
Creatinine, Ser: 1.95 mg/dL — ABNORMAL HIGH (ref 0.44–1.00)
GFR, Estimated: 31 mL/min — ABNORMAL LOW (ref >60)
Glucose, Bld: 134 mg/dL — ABNORMAL HIGH (ref 70–99)
Potassium: 3.3 mmol/L — ABNORMAL LOW (ref 3.5–5.1)
Sodium: 138 mmol/L (ref 135–145)

## 2020-09-04 LAB — TROPONIN I (HIGH SENSITIVITY)
Troponin I (High Sensitivity): 8 ng/L (ref <18)
Troponin I (High Sensitivity): 7 ng/L (ref <18)

## 2020-09-04 MED ORDER — POTASSIUM CHLORIDE CRYS ER 20 MEQ PO TBCR
40.0000 meq | EXTENDED_RELEASE_TABLET | Freq: Once | ORAL | Status: AC
  Administered 2020-09-04: 40 meq via ORAL
  Filled 2020-09-04: qty 2

## 2020-09-04 MED ORDER — POTASSIUM CHLORIDE CRYS ER 20 MEQ PO TBCR: 20.0000 meq | EXTENDED_RELEASE_TABLET | Freq: Two times a day (BID) | ORAL | 0 refills | Status: DC

## 2020-09-04 NOTE — ED Triage Notes (Signed)
Intermittent chest pain for the past 2-3 days, also has c/o pain in both legs

## 2020-09-04 NOTE — Discharge Instructions (Addendum)
Your work-up today was reassuring.  Your potassium level was low.  This can occur from taking diuretics.  Please take the potassium as directed.  You will need to have your potassium level rechecked in 1 to 2 weeks.  Also, I have listed the local cardiology office.  Please contact their office to make a follow-up appointment.  Return to the emergency department if you develop any worsening symptoms especially increasing shortness of breath.
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
## 2020-09-05 NOTE — Telephone Encounter (Signed)
Noted pt seen in ER No ACS

## 2020-09-10 ENCOUNTER — Encounter: Payer: Self-pay | Admitting: Family Medicine

## 2020-09-10 NOTE — Telephone Encounter (Signed)
   Her renal function had acutely worsened, this can be seen after an acute illness and with dehydration. I recommend she come get repeat BMET in 1 week, hold the lasix if she has been taking and rehydrate

## 2020-09-11 ENCOUNTER — Other Ambulatory Visit: Payer: Self-pay | Admitting: *Deleted

## 2020-09-11 DIAGNOSIS — N179 Acute kidney failure, unspecified: Secondary | ICD-10-CM

## 2020-09-18 ENCOUNTER — Other Ambulatory Visit: Payer: 59

## 2020-09-18 ENCOUNTER — Other Ambulatory Visit: Payer: Self-pay

## 2020-09-18 DIAGNOSIS — N179 Acute kidney failure, unspecified: Secondary | ICD-10-CM

## 2020-09-19 LAB — BASIC METABOLIC PANEL WITH GFR
BUN/Creatinine Ratio: 10 (calc) (ref 6–22)
BUN: 22 mg/dL (ref 7–25)
CO2: 30 mmol/L (ref 20–32)
Calcium: 9.3 mg/dL (ref 8.6–10.4)
Chloride: 102 mmol/L (ref 98–110)
Creat: 2.19 mg/dL — ABNORMAL HIGH (ref 0.50–1.05)
GFR, Est African American: 29 mL/min/{1.73_m2} — ABNORMAL LOW (ref >=60)
GFR, Est Non African American: 25 mL/min/{1.73_m2} — ABNORMAL LOW (ref >=60)
Glucose, Bld: 100 mg/dL — ABNORMAL HIGH (ref 65–99)
Potassium: 4.3 mmol/L (ref 3.5–5.3)
Sodium: 140 mmol/L (ref 135–146)

## 2020-09-20 ENCOUNTER — Encounter: Payer: Self-pay | Admitting: Nurse Practitioner

## 2020-09-24 ENCOUNTER — Other Ambulatory Visit: Payer: Self-pay

## 2020-09-24 ENCOUNTER — Ambulatory Visit (INDEPENDENT_AMBULATORY_CARE_PROVIDER_SITE_OTHER): Payer: 59 | Admitting: Nurse Practitioner

## 2020-09-24 VITALS — BP 138/82 | HR 65 | Temp 97.7°F | Ht 67.0 in | Wt 268.8 lb

## 2020-09-24 DIAGNOSIS — H938X3 Other specified disorders of ear, bilateral: Secondary | ICD-10-CM

## 2020-09-24 DIAGNOSIS — N1831 Chronic kidney disease, stage 3a: Secondary | ICD-10-CM | POA: Diagnosis not present

## 2020-09-24 LAB — BASIC METABOLIC PANEL WITH GFR
BUN/Creatinine Ratio: 7 (calc) (ref 6–22)
BUN: 14 mg/dL (ref 7–25)
CO2: 27 mmol/L (ref 20–32)
Calcium: 8.6 mg/dL (ref 8.6–10.4)
Chloride: 104 mmol/L (ref 98–110)
Creat: 1.93 mg/dL — ABNORMAL HIGH (ref 0.50–1.05)
GFR, Est African American: 34 mL/min/{1.73_m2} — ABNORMAL LOW (ref >=60)
GFR, Est Non African American: 29 mL/min/{1.73_m2} — ABNORMAL LOW (ref >=60)
Glucose, Bld: 104 mg/dL — ABNORMAL HIGH (ref 65–99)
Potassium: 3.6 mmol/L (ref 3.5–5.3)
Sodium: 140 mmol/L (ref 135–146)

## 2020-09-24 NOTE — Assessment & Plan Note (Signed)
Chronic, ongoing.  Recent decrease in kidney function after acute illness.  Will recheck today.  If remains elevated, will consult with nephrologist and may obtain renal ultrasound.

## 2020-09-24 NOTE — Patient Instructions (Addendum)
Start taking the nasal steroid daily and see if that does not help with your ear symptoms.  Will let you know about BMET tomorrow.
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
## 2020-09-25 ENCOUNTER — Encounter: Payer: Self-pay | Admitting: Nurse Practitioner

## 2020-09-25 DIAGNOSIS — N1831 Chronic kidney disease, stage 3a: Secondary | ICD-10-CM

## 2020-10-08 ENCOUNTER — Other Ambulatory Visit: Payer: Self-pay | Admitting: Family Medicine

## 2020-10-08 MED ORDER — HYDROCODONE-ACETAMINOPHEN 5-325 MG PO TABS: 1.0000 | ORAL_TABLET | ORAL | 0 refills | Status: DC | PRN

## 2020-10-08 NOTE — Telephone Encounter (Signed)
Ok to refill??  Last office visit 09/25/2019.  Last refill 08/24/2019.

## 2020-11-16 ENCOUNTER — Ambulatory Visit: Payer: Self-pay | Admitting: Nurse Practitioner

## 2020-11-27 ENCOUNTER — Other Ambulatory Visit: Payer: Self-pay | Admitting: Gastroenterology

## 2020-11-27 DIAGNOSIS — K219 Gastro-esophageal reflux disease without esophagitis: Secondary | ICD-10-CM

## 2020-11-27 DIAGNOSIS — R142 Eructation: Secondary | ICD-10-CM

## 2020-11-27 DIAGNOSIS — A084 Viral intestinal infection, unspecified: Secondary | ICD-10-CM

## 2020-11-28 ENCOUNTER — Other Ambulatory Visit: Payer: Self-pay | Admitting: Family Medicine

## 2020-12-07 ENCOUNTER — Telehealth: Payer: Self-pay

## 2020-12-07 MED ORDER — HYDROCODONE-ACETAMINOPHEN 5-325 MG PO TABS: 1.0000 | ORAL_TABLET | ORAL | 0 refills | Status: DC | PRN

## 2020-12-07 NOTE — Telephone Encounter (Signed)
This is a Dr. [name] patient and she is asking for a refill on Vicodin. I don't see it in her medication list.  PATIENT US[facility]  Please advise

## 2020-12-21 ENCOUNTER — Ambulatory Visit (INDEPENDENT_AMBULATORY_CARE_PROVIDER_SITE_OTHER): Payer: 59 | Admitting: Nurse Practitioner

## 2020-12-21 ENCOUNTER — Other Ambulatory Visit: Payer: Self-pay

## 2020-12-21 VITALS — BP 134/72 | HR 64 | Temp 97.8°F | Ht 67.0 in | Wt 273.6 lb

## 2020-12-21 DIAGNOSIS — E119 Type 2 diabetes mellitus without complications: Secondary | ICD-10-CM | POA: Diagnosis not present

## 2020-12-21 DIAGNOSIS — D649 Anemia, unspecified: Secondary | ICD-10-CM

## 2020-12-21 DIAGNOSIS — B9689 Other specified bacterial agents as the cause of diseases classified elsewhere: Secondary | ICD-10-CM

## 2020-12-21 DIAGNOSIS — N898 Other specified noninflammatory disorders of vagina: Secondary | ICD-10-CM

## 2020-12-21 DIAGNOSIS — I10 Essential (primary) hypertension: Secondary | ICD-10-CM

## 2020-12-21 DIAGNOSIS — N1831 Chronic kidney disease, stage 3a: Secondary | ICD-10-CM | POA: Diagnosis not present

## 2020-12-21 DIAGNOSIS — M79601 Pain in right arm: Secondary | ICD-10-CM

## 2020-12-21 DIAGNOSIS — N760 Acute vaginitis: Secondary | ICD-10-CM

## 2020-12-21 DIAGNOSIS — M2550 Pain in unspecified joint: Secondary | ICD-10-CM

## 2020-12-21 LAB — URINALYSIS, ROUTINE W REFLEX MICROSCOPIC
Bilirubin Urine: NEGATIVE
Glucose, UA: NEGATIVE
Hyaline Cast: NONE SEEN /LPF
Ketones, ur: NEGATIVE
Nitrite: NEGATIVE
Specific Gravity, Urine: 1.02 (ref 1.001–1.035)
pH: 7 (ref 5.0–8.0)

## 2020-12-21 LAB — MICROSCOPIC MESSAGE

## 2020-12-21 LAB — WET PREP FOR TRICH, YEAST, CLUE

## 2020-12-21 MED ORDER — HYDROCODONE-ACETAMINOPHEN 5-325 MG PO TABS: 1.0000 | ORAL_TABLET | ORAL | 0 refills | Status: DC | PRN

## 2020-12-21 MED ORDER — METRONIDAZOLE 0.75 % VA GEL: 1.0000 | Freq: Every day | VAGINAL | 0 refills | Status: DC
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
## 2020-12-22 LAB — HEMOGLOBIN A1C
Hgb A1c MFr Bld: 6 % of total Hgb — ABNORMAL HIGH (ref <5.7)
Mean Plasma Glucose: 126 mg/dL
eAG (mmol/L): 7 mmol/L

## 2020-12-22 LAB — COMPLETE METABOLIC PANEL WITH GFR
AG Ratio: 1.2 (calc) (ref 1.0–2.5)
ALT: 10 U/L (ref 6–29)
AST: 12 U/L (ref 10–35)
Albumin: 3.9 g/dL (ref 3.6–5.1)
Alkaline phosphatase (APISO): 96 U/L (ref 37–153)
BUN/Creatinine Ratio: 8 (calc) (ref 6–22)
BUN: 18 mg/dL (ref 7–25)
CO2: 25 mmol/L (ref 20–32)
Calcium: 8.7 mg/dL (ref 8.6–10.4)
Chloride: 103 mmol/L (ref 98–110)
Creat: 2.14 mg/dL — ABNORMAL HIGH (ref 0.50–1.05)
GFR, Est African American: 30 mL/min/{1.73_m2} — ABNORMAL LOW (ref >=60)
GFR, Est Non African American: 26 mL/min/{1.73_m2} — ABNORMAL LOW (ref >=60)
Globulin: 3.2 g/dL (calc) (ref 1.9–3.7)
Glucose, Bld: 106 mg/dL — ABNORMAL HIGH (ref 65–99)
Potassium: 4.3 mmol/L (ref 3.5–5.3)
Sodium: 140 mmol/L (ref 135–146)
Total Bilirubin: 0.3 mg/dL (ref 0.2–1.2)
Total Protein: 7.1 g/dL (ref 6.1–8.1)

## 2020-12-22 LAB — CBC WITH DIFFERENTIAL/PLATELET
Absolute Monocytes: 322 cells/uL (ref 200–950)
Basophils Absolute: 42 cells/uL (ref 0–200)
Basophils Relative: 0.6 %
Eosinophils Absolute: 210 cells/uL (ref 15–500)
Eosinophils Relative: 3 %
HCT: 35.4 % (ref 35.0–45.0)
Hemoglobin: 11.3 g/dL — ABNORMAL LOW (ref 11.7–15.5)
Lymphs Abs: 1680 cells/uL (ref 850–3900)
MCH: 26.9 pg — ABNORMAL LOW (ref 27.0–33.0)
MCHC: 31.9 g/dL — ABNORMAL LOW (ref 32.0–36.0)
MCV: 84.3 fL (ref 80.0–100.0)
MPV: 9.2 fL (ref 7.5–12.5)
Monocytes Relative: 4.6 %
Neutro Abs: 4746 cells/uL (ref 1500–7800)
Neutrophils Relative %: 67.8 %
Platelets: 286 10*3/uL (ref 140–400)
RBC: 4.2 10*6/uL (ref 3.80–5.10)
RDW: 13.5 % (ref 11.0–15.0)
Total Lymphocyte: 24 %
WBC: 7 10*3/uL (ref 3.8–10.8)

## 2020-12-22 LAB — LIPID PANEL
Cholesterol: 209 mg/dL — ABNORMAL HIGH (ref <200)
HDL: 56 mg/dL (ref >=50)
LDL Cholesterol (Calc): 129 mg/dL (calc) — ABNORMAL HIGH
Non-HDL Cholesterol (Calc): 153 mg/dL (calc) — ABNORMAL HIGH (ref <130)
Total CHOL/HDL Ratio: 3.7 (calc) (ref <5.0)
Triglycerides: 128 mg/dL (ref <150)

## 2020-12-22 LAB — URINE CULTURE
MICRO NUMBER:: 11943617
SPECIMEN QUALITY:: ADEQUATE

## 2020-12-22 NOTE — Assessment & Plan Note (Addendum)
Chronic.  Will check A1c today.  Previously diet controlled.  Encouraged low carbohydrate diet, watching simple sugars, increase physical activity to reduce cardiovascular risk.  Follow-up with new PCP

## 2020-12-22 NOTE — Assessment & Plan Note (Signed)
Chronic.  Follows closely with nephrology.  Need to maximize control of blood pressure and blood sugar.  Continue collaboration nephrology.  Follow-up with new PCP.

## 2020-12-22 NOTE — Assessment & Plan Note (Addendum)
Chronic.  Blood pressure near goal today in clinic.  Follows with nephrology-continue collaboration.  For now, continue current medications.  We will check kidney function with electrolytes and continue amlodipine 10 mg daily for now.  Follow-up with new PCP.

## 2020-12-24 LAB — C. TRACHOMATIS/N. GONORRHOEAE RNA
C. trachomatis RNA, TMA: NOT DETECTED
N. gonorrhoeae RNA, TMA: NOT DETECTED

## 2020-12-27 ENCOUNTER — Telehealth: Payer: Self-pay

## 2020-12-27 ENCOUNTER — Other Ambulatory Visit: Payer: Self-pay | Admitting: Nurse Practitioner

## 2020-12-27 DIAGNOSIS — D649 Anemia, unspecified: Secondary | ICD-10-CM

## 2020-12-27 NOTE — Addendum Note (Signed)
Addended by: Noemi Chapel A on: 12/27/2020 07:58 AM   Modules accepted: Orders

## 2020-12-27 NOTE — Telephone Encounter (Signed)
That is great.  Please send in atorvastatin 10 mg 1 tablet PO every evening #90 no refills.

## 2020-12-27 NOTE — Progress Notes (Signed)
Blood work ordered for future as it cannot be added on to previous blood draw

## 2020-12-28 ENCOUNTER — Other Ambulatory Visit: Payer: 59

## 2020-12-28 ENCOUNTER — Other Ambulatory Visit: Payer: Self-pay

## 2020-12-28 DIAGNOSIS — D649 Anemia, unspecified: Secondary | ICD-10-CM

## 2020-12-28 MED ORDER — ATORVASTATIN CALCIUM 10 MG PO TABS
10.0000 mg | ORAL_TABLET | Freq: Every day | ORAL | 0 refills | Status: DC
Start: 2020-12-28 — End: 2021-03-12

## 2020-12-29 LAB — IRON,TIBC AND FERRITIN PANEL
%SAT: 20 % (calc) (ref 16–45)
Ferritin: 32 ng/mL (ref 16–232)
Iron: 62 ug/dL (ref 45–160)
TIBC: 306 mcg/dL (calc) (ref 250–450)

## 2020-12-29 LAB — B12 AND FOLATE PANEL
Folate: 6.9 ng/mL
Vitamin B-12: 461 pg/mL (ref 200–1100)

## 2020-12-31 ENCOUNTER — Telehealth: Payer: Self-pay

## 2020-12-31 NOTE — Telephone Encounter (Signed)
Patient called to follow up on Rx from provider which requires a prior auth.   Pharmacy confirmed as  [id number] - [place], [place]  [address] [place] [address]  Phone:  [phone] Fax:  [phone]  DEA #:  [id number]  Please advise at [phone]

## 2021-01-01 ENCOUNTER — Other Ambulatory Visit: Payer: Self-pay

## 2021-01-01 ENCOUNTER — Ambulatory Visit: Payer: 59 | Admitting: Orthopaedic Surgery

## 2021-01-01 ENCOUNTER — Ambulatory Visit: Payer: 59

## 2021-01-01 ENCOUNTER — Telehealth: Payer: Self-pay

## 2021-01-01 ENCOUNTER — Encounter: Payer: Self-pay | Admitting: Orthopaedic Surgery

## 2021-01-01 VITALS — BP 157/94 | HR 57 | Ht 67.0 in | Wt 276.8 lb

## 2021-01-01 DIAGNOSIS — M7711 Lateral epicondylitis, right elbow: Secondary | ICD-10-CM | POA: Diagnosis not present

## 2021-01-01 DIAGNOSIS — M25562 Pain in left knee: Secondary | ICD-10-CM

## 2021-01-01 DIAGNOSIS — G8929 Other chronic pain: Secondary | ICD-10-CM

## 2021-01-01 NOTE — Telephone Encounter (Signed)
Received request from pharmacy for PA on Hydrocodone/APAP.   PA submitted.   Dx: G54.2- cervical nerve root impingement, M50.9- cervical neck pain with disc disease.  Received immediate PA determination.   PA approved 01/01/2021 - 06/30/2021.

## 2021-01-01 NOTE — Patient Instructions (Signed)
Go over lab results

## 2021-01-01 NOTE — Telephone Encounter (Signed)
Prior Auth  Approved 01/01/21-22/04/22 for hydrocodone
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
## 2021-01-02 LAB — URIC ACID: Uric Acid, Serum: 6.1 mg/dL (ref 2.5–7.0)

## 2021-01-08 ENCOUNTER — Ambulatory Visit (HOSPITAL_COMMUNITY)
Admission: RE | Admit: 2021-01-08 | Discharge: 2021-01-08 | Disposition: A | Discharge status: Home / Self Care | Payer: 59 | Source: Ambulatory Visit | Attending: Nurse Practitioner | Admitting: Nurse Practitioner

## 2021-01-08 DIAGNOSIS — N1831 Chronic kidney disease, stage 3a: Secondary | ICD-10-CM

## 2021-01-09 ENCOUNTER — Encounter: Payer: Self-pay | Admitting: Nurse Practitioner

## 2021-01-09 DIAGNOSIS — G4733 Obstructive sleep apnea (adult) (pediatric): Secondary | ICD-10-CM

## 2021-01-09 DIAGNOSIS — Z9989 Dependence on other enabling machines and devices: Secondary | ICD-10-CM

## 2021-01-09 NOTE — Telephone Encounter (Signed)
Pt is asking for renal results to be explained

## 2021-01-16 NOTE — Telephone Encounter (Signed)
Referral placed.

## 2021-01-22 ENCOUNTER — Encounter: Payer: Self-pay | Admitting: Orthopaedic Surgery

## 2021-01-22 ENCOUNTER — Ambulatory Visit: Payer: 59 | Admitting: Orthopaedic Surgery

## 2021-01-26 ENCOUNTER — Other Ambulatory Visit: Payer: Self-pay | Admitting: Nurse Practitioner

## 2021-01-26 DIAGNOSIS — M2550 Pain in unspecified joint: Secondary | ICD-10-CM

## 2021-02-19 ENCOUNTER — Encounter: Payer: Self-pay | Admitting: Orthopaedic Surgery

## 2021-02-19 ENCOUNTER — Telehealth: Payer: Self-pay | Admitting: *Deleted

## 2021-02-19 DIAGNOSIS — M2550 Pain in unspecified joint: Secondary | ICD-10-CM

## 2021-02-19 NOTE — Telephone Encounter (Signed)
Received call from patient.   Reports that she is having increased pain due to bone spurs, joint pain and possible gout.   Reports that she is out of Hydrocodone/APAP. Requested refill. Ok to refill?? Last office visit 01/09/2021. Last refill 12/21/2020.  Also reports that at last appointment with NP, she was advised to find a new PCP, but has been unable to establish.   Reports that patient spouse is still a patient at [facility] and she would like to stay.   Please advise.

## 2021-02-19 NOTE — Telephone Encounter (Signed)
Patient uses [facility] [address]

## 2021-03-12 ENCOUNTER — Telehealth: Payer: Self-pay

## 2021-03-12 ENCOUNTER — Other Ambulatory Visit: Payer: Self-pay | Admitting: Family Medicine

## 2021-03-12 DIAGNOSIS — A084 Viral intestinal infection, unspecified: Secondary | ICD-10-CM

## 2021-03-12 DIAGNOSIS — K5909 Other constipation: Secondary | ICD-10-CM

## 2021-03-12 DIAGNOSIS — K219 Gastro-esophageal reflux disease without esophagitis: Secondary | ICD-10-CM

## 2021-03-12 DIAGNOSIS — R142 Eructation: Secondary | ICD-10-CM

## 2021-03-12 MED ORDER — MECLIZINE HCL 25 MG PO TABS: 25.0000 mg | ORAL_TABLET | Freq: Three times a day (TID) | ORAL | 0 refills | Status: DC | PRN

## 2021-03-12 MED ORDER — LINACLOTIDE 145 MCG PO CAPS: 145.0000 ug | ORAL_CAPSULE | Freq: Every day | ORAL | 0 refills | Status: DC

## 2021-03-12 MED ORDER — ATORVASTATIN CALCIUM 10 MG PO TABS: 10.0000 mg | ORAL_TABLET | Freq: Every day | ORAL | 0 refills | Status: DC

## 2021-03-12 MED ORDER — CETIRIZINE HCL 10 MG PO TABS: 10.0000 mg | ORAL_TABLET | Freq: Every day | ORAL | 0 refills | Status: AC

## 2021-03-12 NOTE — Telephone Encounter (Signed)
Prescription sent to pharmacy.  No further refills to be given. Patient must establish with new PCP.  

## 2021-03-12 NOTE — Telephone Encounter (Signed)
Patient called to request courtesy refills of the following meds:  atorvastatin (LIPITOR) 10 MG tablet [[id number]] meclizine (ANTIVERT) 25 MG tablet [[id number]]    cetirizine (ZYRTEC) 10 MG tablet [[id number]]    linaclotide (LINZESS) 145 MCG CAPS capsule [[id number]]     Pharmacy confirmed as  [id number] [place], [place]  [address] [place] [address]  Phone:  [phone]  Fax:  [phone]  DEA #:  [id number]  Please advise at [phone]

## 2021-03-13 ENCOUNTER — Institutional Professional Consult (permissible substitution): Payer: 59 | Admitting: Pulmonary Disease

## 2021-03-13 ENCOUNTER — Ambulatory Visit: Payer: 59 | Admitting: Pulmonary Disease

## 2021-04-04 DIAGNOSIS — D649 Anemia, unspecified: Secondary | ICD-10-CM | POA: Insufficient documentation

## 2021-04-04 DIAGNOSIS — E785 Hyperlipidemia, unspecified: Secondary | ICD-10-CM | POA: Insufficient documentation

## 2021-04-04 DIAGNOSIS — F419 Anxiety disorder, unspecified: Secondary | ICD-10-CM | POA: Insufficient documentation

## 2021-04-04 DIAGNOSIS — I10 Essential (primary) hypertension: Secondary | ICD-10-CM | POA: Insufficient documentation

## 2021-05-06 ENCOUNTER — Other Ambulatory Visit (HOSPITAL_COMMUNITY): Payer: Self-pay | Admitting: Nephrology

## 2021-05-06 DIAGNOSIS — D472 Monoclonal gammopathy: Secondary | ICD-10-CM

## 2021-05-06 DIAGNOSIS — E1122 Type 2 diabetes mellitus with diabetic chronic kidney disease: Secondary | ICD-10-CM

## 2021-05-06 DIAGNOSIS — R809 Proteinuria, unspecified: Secondary | ICD-10-CM

## 2021-05-14 ENCOUNTER — Other Ambulatory Visit: Payer: Self-pay | Admitting: Radiology

## 2021-05-15 ENCOUNTER — Encounter (HOSPITAL_COMMUNITY): Payer: Self-pay

## 2021-05-15 ENCOUNTER — Ambulatory Visit (HOSPITAL_COMMUNITY)
Admission: RE | Admit: 2021-05-15 | Discharge: 2021-05-15 | Disposition: A | Discharge status: Home / Self Care | Payer: 59 | Source: Ambulatory Visit | Attending: Nephrology | Admitting: Nephrology

## 2021-05-15 ENCOUNTER — Other Ambulatory Visit: Payer: Self-pay

## 2021-05-15 DIAGNOSIS — I129 Hypertensive chronic kidney disease with stage 1 through stage 4 chronic kidney disease, or unspecified chronic kidney disease: Secondary | ICD-10-CM | POA: Diagnosis not present

## 2021-05-15 DIAGNOSIS — E1122 Type 2 diabetes mellitus with diabetic chronic kidney disease: Secondary | ICD-10-CM | POA: Diagnosis present

## 2021-05-15 DIAGNOSIS — N1832 Chronic kidney disease, stage 3b: Secondary | ICD-10-CM | POA: Insufficient documentation

## 2021-05-15 DIAGNOSIS — Z6841 Body Mass Index (BMI) 40.0 and over, adult: Secondary | ICD-10-CM | POA: Diagnosis not present

## 2021-05-15 DIAGNOSIS — D472 Monoclonal gammopathy: Secondary | ICD-10-CM | POA: Insufficient documentation

## 2021-05-15 DIAGNOSIS — N281 Cyst of kidney, acquired: Secondary | ICD-10-CM | POA: Diagnosis not present

## 2021-05-15 DIAGNOSIS — R809 Proteinuria, unspecified: Secondary | ICD-10-CM

## 2021-05-15 DIAGNOSIS — E1129 Type 2 diabetes mellitus with other diabetic kidney complication: Secondary | ICD-10-CM | POA: Insufficient documentation

## 2021-05-15 DIAGNOSIS — D631 Anemia in chronic kidney disease: Secondary | ICD-10-CM | POA: Diagnosis not present

## 2021-05-15 DIAGNOSIS — E872 Acidosis, unspecified: Secondary | ICD-10-CM | POA: Insufficient documentation

## 2021-05-15 DIAGNOSIS — Z7901 Long term (current) use of anticoagulants: Secondary | ICD-10-CM | POA: Diagnosis not present

## 2021-05-15 DIAGNOSIS — E211 Secondary hyperparathyroidism, not elsewhere classified: Secondary | ICD-10-CM | POA: Insufficient documentation

## 2021-05-15 DIAGNOSIS — Z79899 Other long term (current) drug therapy: Secondary | ICD-10-CM | POA: Insufficient documentation

## 2021-05-15 DIAGNOSIS — R808 Other proteinuria: Secondary | ICD-10-CM | POA: Diagnosis not present

## 2021-05-15 LAB — PROTIME-INR
INR: 1.1 (ref 0.8–1.2)
Prothrombin Time: 13.7 seconds (ref 11.4–15.2)

## 2021-05-15 LAB — CBC
HCT: 32.7 % — ABNORMAL LOW (ref 36.0–46.0)
Hemoglobin: 10.1 g/dL — ABNORMAL LOW (ref 12.0–15.0)
MCH: 27 pg (ref 26.0–34.0)
MCHC: 30.9 g/dL (ref 30.0–36.0)
MCV: 87.4 fL (ref 80.0–100.0)
Platelets: 270 10*3/uL (ref 150–400)
RBC: 3.74 MIL/uL — ABNORMAL LOW (ref 3.87–5.11)
RDW: 14.6 % (ref 11.5–15.5)
WBC: 7 10*3/uL (ref 4.0–10.5)
nRBC: 0 % (ref 0.0–0.2)

## 2021-05-15 MED ORDER — MIDAZOLAM HCL 2 MG/2ML IJ SOLN
INTRAMUSCULAR | Status: AC
  Filled 2021-05-15: qty 2

## 2021-05-15 MED ORDER — FENTANYL CITRATE (PF) 100 MCG/2ML IJ SOLN
INTRAMUSCULAR | Status: AC
  Filled 2021-05-15: qty 2

## 2021-05-15 MED ORDER — MIDAZOLAM HCL 2 MG/2ML IJ SOLN
INTRAMUSCULAR | Status: DC | PRN
  Administered 2021-05-15: .5 mg via INTRAVENOUS

## 2021-05-15 MED ORDER — GELATIN ABSORBABLE 12-7 MM EX MISC
CUTANEOUS | Status: AC
  Filled 2021-05-15: qty 1

## 2021-05-15 MED ORDER — FENTANYL CITRATE (PF) 100 MCG/2ML IJ SOLN
INTRAMUSCULAR | Status: DC | PRN
  Administered 2021-05-15: 25 ug via INTRAVENOUS

## 2021-05-15 MED ORDER — LIDOCAINE HCL (PF) 1 % IJ SOLN
INTRAMUSCULAR | Status: AC
  Filled 2021-05-15: qty 30

## 2021-05-15 MED ORDER — SODIUM CHLORIDE 0.9 % IV SOLN: INTRAVENOUS | Status: DC

## 2021-05-15 NOTE — Procedures (Signed)
Interventional Radiology Procedure Note  Procedure: US guided biopsy of left kidney, medical renal Complications: None EBL: None Recommendations: - Bedrest 2 hours.   - Routine wound care - Follow up pathology - Advance diet   Signed,  Celisa Schoenberg, DO   
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
## 2021-05-21 ENCOUNTER — Institutional Professional Consult (permissible substitution): Payer: 59 | Admitting: Pulmonary Disease

## 2021-05-21 ENCOUNTER — Encounter (HOSPITAL_COMMUNITY): Payer: Self-pay

## 2021-05-21 DIAGNOSIS — D472 Monoclonal gammopathy: Secondary | ICD-10-CM | POA: Insufficient documentation

## 2021-05-21 DIAGNOSIS — N2581 Secondary hyperparathyroidism of renal origin: Secondary | ICD-10-CM | POA: Insufficient documentation

## 2021-05-21 DIAGNOSIS — D631 Anemia in chronic kidney disease: Secondary | ICD-10-CM | POA: Insufficient documentation

## 2021-05-21 DIAGNOSIS — N281 Cyst of kidney, acquired: Secondary | ICD-10-CM | POA: Insufficient documentation

## 2021-05-21 DIAGNOSIS — N189 Chronic kidney disease, unspecified: Secondary | ICD-10-CM | POA: Insufficient documentation

## 2021-05-21 LAB — SURGICAL PATHOLOGY

## 2021-06-08 ENCOUNTER — Other Ambulatory Visit: Payer: Self-pay | Admitting: Family Medicine

## 2021-06-08 DIAGNOSIS — K219 Gastro-esophageal reflux disease without esophagitis: Secondary | ICD-10-CM

## 2021-06-08 DIAGNOSIS — R142 Eructation: Secondary | ICD-10-CM

## 2021-06-08 DIAGNOSIS — A084 Viral intestinal infection, unspecified: Secondary | ICD-10-CM

## 2021-06-08 DIAGNOSIS — K5909 Other constipation: Secondary | ICD-10-CM

## 2021-06-14 ENCOUNTER — Other Ambulatory Visit (HOSPITAL_COMMUNITY)
Admission: RE | Admit: 2021-06-14 | Discharge: 2021-06-14 | Disposition: A | Discharge status: Home / Self Care | Payer: 59 | Source: Ambulatory Visit | Attending: Nephrology | Admitting: Nephrology

## 2021-06-14 DIAGNOSIS — Z79899 Other long term (current) drug therapy: Secondary | ICD-10-CM | POA: Insufficient documentation

## 2021-06-14 DIAGNOSIS — R808 Other proteinuria: Secondary | ICD-10-CM | POA: Insufficient documentation

## 2021-06-14 DIAGNOSIS — D472 Monoclonal gammopathy: Secondary | ICD-10-CM | POA: Insufficient documentation

## 2021-06-14 DIAGNOSIS — E1122 Type 2 diabetes mellitus with diabetic chronic kidney disease: Secondary | ICD-10-CM | POA: Diagnosis present

## 2021-06-14 DIAGNOSIS — E1129 Type 2 diabetes mellitus with other diabetic kidney complication: Secondary | ICD-10-CM | POA: Diagnosis present

## 2021-06-14 DIAGNOSIS — D631 Anemia in chronic kidney disease: Secondary | ICD-10-CM | POA: Insufficient documentation

## 2021-06-14 DIAGNOSIS — N189 Chronic kidney disease, unspecified: Secondary | ICD-10-CM | POA: Insufficient documentation

## 2021-06-14 DIAGNOSIS — I129 Hypertensive chronic kidney disease with stage 1 through stage 4 chronic kidney disease, or unspecified chronic kidney disease: Secondary | ICD-10-CM | POA: Insufficient documentation

## 2021-06-14 DIAGNOSIS — R809 Proteinuria, unspecified: Secondary | ICD-10-CM | POA: Insufficient documentation

## 2021-06-14 LAB — CBC WITH DIFFERENTIAL/PLATELET
Abs Immature Granulocytes: 0.04 10*3/uL (ref 0.00–0.07)
Basophils Absolute: 0 10*3/uL (ref 0.0–0.1)
Basophils Relative: 0 %
Eosinophils Absolute: 0.2 10*3/uL (ref 0.0–0.5)
Eosinophils Relative: 2 %
HCT: 34 % — ABNORMAL LOW (ref 36.0–46.0)
Hemoglobin: 10.8 g/dL — ABNORMAL LOW (ref 12.0–15.0)
Immature Granulocytes: 1 %
Lymphocytes Relative: 23 %
Lymphs Abs: 1.5 10*3/uL (ref 0.7–4.0)
MCH: 27.5 pg (ref 26.0–34.0)
MCHC: 31.8 g/dL (ref 30.0–36.0)
MCV: 86.5 fL (ref 80.0–100.0)
Monocytes Absolute: 0.4 10*3/uL (ref 0.1–1.0)
Monocytes Relative: 6 %
Neutro Abs: 4.5 10*3/uL (ref 1.7–7.7)
Neutrophils Relative %: 68 %
Platelets: 277 10*3/uL (ref 150–400)
RBC: 3.93 MIL/uL (ref 3.87–5.11)
RDW: 14.4 % (ref 11.5–15.5)
WBC: 6.7 10*3/uL (ref 4.0–10.5)
nRBC: 0 % (ref 0.0–0.2)

## 2021-06-14 LAB — SEDIMENTATION RATE: Sed Rate: 58 mm/hr — ABNORMAL HIGH (ref 0–22)

## 2021-06-14 LAB — RENAL FUNCTION PANEL
Albumin: 3.5 g/dL (ref 3.5–5.0)
Anion gap: 6 (ref 5–15)
BUN: 28 mg/dL — ABNORMAL HIGH (ref 6–20)
CO2: 26 mmol/L (ref 22–32)
Calcium: 8.6 mg/dL — ABNORMAL LOW (ref 8.9–10.3)
Chloride: 107 mmol/L (ref 98–111)
Creatinine, Ser: 2.49 mg/dL — ABNORMAL HIGH (ref 0.44–1.00)
GFR, Estimated: 23 mL/min — ABNORMAL LOW (ref >60)
Glucose, Bld: 120 mg/dL — ABNORMAL HIGH (ref 70–99)
Phosphorus: 3.5 mg/dL (ref 2.5–4.6)
Potassium: 4.1 mmol/L (ref 3.5–5.1)
Sodium: 139 mmol/L (ref 135–145)

## 2021-06-15 LAB — ANA: Anti Nuclear Antibody (ANA): NEGATIVE

## 2021-06-15 LAB — C4 COMPLEMENT: Complement C4, Body Fluid: 51 mg/dL — ABNORMAL HIGH (ref 12–38)

## 2021-06-15 LAB — C3 COMPLEMENT: C3 Complement: 169 mg/dL — ABNORMAL HIGH (ref 82–167)

## 2021-06-15 LAB — GLOMERULAR BASEMENT MEMBRANE ANTIBODIES: GBM Ab: <0.2 units (ref 0.0–0.9)

## 2021-06-17 LAB — ANCA TITERS
Atypical P-ANCA titer: <1:20 {titer}
C-ANCA: <1:20 {titer}
P-ANCA: <1:20 {titer}

## 2021-06-18 ENCOUNTER — Inpatient Hospital Stay (HOSPITAL_COMMUNITY): Admission: RE | Admit: 2021-06-18 | Payer: 59 | Source: Ambulatory Visit

## 2021-06-19 ENCOUNTER — Encounter (HOSPITAL_COMMUNITY): Admission: RE | Admit: 2021-06-19 | Payer: 59 | Source: Ambulatory Visit

## 2021-06-25 ENCOUNTER — Encounter (HOSPITAL_COMMUNITY)
Admission: RE | Admit: 2021-06-25 | Discharge: 2021-06-25 | Disposition: A | Discharge status: Home / Self Care | Payer: 59 | Source: Ambulatory Visit | Attending: Nephrology | Admitting: Nephrology

## 2021-06-25 ENCOUNTER — Encounter (HOSPITAL_COMMUNITY): Payer: Self-pay

## 2021-06-25 ENCOUNTER — Other Ambulatory Visit: Payer: Self-pay

## 2021-06-25 DIAGNOSIS — D631 Anemia in chronic kidney disease: Secondary | ICD-10-CM | POA: Insufficient documentation

## 2021-06-25 DIAGNOSIS — N013 Rapidly progressive nephritic syndrome with diffuse mesangial proliferative glomerulonephritis: Secondary | ICD-10-CM | POA: Insufficient documentation

## 2021-06-25 DIAGNOSIS — E211 Secondary hyperparathyroidism, not elsewhere classified: Secondary | ICD-10-CM | POA: Diagnosis not present

## 2021-06-25 DIAGNOSIS — R808 Other proteinuria: Secondary | ICD-10-CM | POA: Insufficient documentation

## 2021-06-25 DIAGNOSIS — N184 Chronic kidney disease, stage 4 (severe): Secondary | ICD-10-CM | POA: Diagnosis not present

## 2021-06-25 MED ORDER — METHYLPREDNISOLONE SODIUM SUCC 125 MG IJ SOLR
125.0000 mg | Freq: Once | INTRAMUSCULAR | Status: AC
  Administered 2021-06-25: 125 mg via INTRAVENOUS

## 2021-06-26 ENCOUNTER — Encounter (HOSPITAL_COMMUNITY)
Admission: RE | Admit: 2021-06-26 | Discharge: 2021-06-26 | Disposition: A | Discharge status: Home / Self Care | Payer: 59 | Source: Ambulatory Visit | Attending: Nephrology | Admitting: Nephrology

## 2021-06-26 DIAGNOSIS — N013 Rapidly progressive nephritic syndrome with diffuse mesangial proliferative glomerulonephritis: Secondary | ICD-10-CM | POA: Diagnosis not present

## 2021-06-26 MED ORDER — METHYLPREDNISOLONE SODIUM SUCC 125 MG IJ SOLR
INTRAMUSCULAR | Status: AC
  Administered 2021-06-26: 125 mg
  Filled 2021-06-26: qty 2

## 2021-06-26 MED ORDER — METHYLPREDNISOLONE SODIUM SUCC 125 MG IJ SOLR: 125.0000 mg | Freq: Once | INTRAMUSCULAR | Status: AC

## 2021-06-27 ENCOUNTER — Other Ambulatory Visit: Payer: Self-pay

## 2021-06-27 ENCOUNTER — Encounter (HOSPITAL_COMMUNITY)
Admission: RE | Admit: 2021-06-27 | Discharge: 2021-06-27 | Disposition: A | Discharge status: Home / Self Care | Payer: 59 | Source: Ambulatory Visit | Attending: Nephrology | Admitting: Nephrology

## 2021-06-27 DIAGNOSIS — N009 Acute nephritic syndrome with unspecified morphologic changes: Secondary | ICD-10-CM | POA: Insufficient documentation

## 2021-06-27 DIAGNOSIS — N184 Chronic kidney disease, stage 4 (severe): Secondary | ICD-10-CM | POA: Diagnosis not present

## 2021-06-27 MED ORDER — METHYLPREDNISOLONE SODIUM SUCC 125 MG IJ SOLR
125.0000 mg | Freq: Once | INTRAMUSCULAR | Status: AC
  Administered 2021-06-27: 125 mg via INTRAVENOUS

## 2021-07-11 ENCOUNTER — Other Ambulatory Visit: Payer: Self-pay | Admitting: Family Medicine

## 2021-07-11 DIAGNOSIS — R142 Eructation: Secondary | ICD-10-CM

## 2021-07-11 DIAGNOSIS — K5909 Other constipation: Secondary | ICD-10-CM

## 2021-07-11 DIAGNOSIS — A084 Viral intestinal infection, unspecified: Secondary | ICD-10-CM

## 2021-07-11 DIAGNOSIS — K219 Gastro-esophageal reflux disease without esophagitis: Secondary | ICD-10-CM

## 2021-07-12 ENCOUNTER — Other Ambulatory Visit: Payer: Self-pay | Admitting: Nephrology

## 2021-07-12 ENCOUNTER — Other Ambulatory Visit (HOSPITAL_COMMUNITY): Payer: Self-pay | Admitting: Nephrology

## 2021-07-12 DIAGNOSIS — N189 Chronic kidney disease, unspecified: Secondary | ICD-10-CM

## 2021-07-12 DIAGNOSIS — D631 Anemia in chronic kidney disease: Secondary | ICD-10-CM

## 2021-07-12 DIAGNOSIS — N184 Chronic kidney disease, stage 4 (severe): Secondary | ICD-10-CM

## 2021-07-12 DIAGNOSIS — R809 Proteinuria, unspecified: Secondary | ICD-10-CM

## 2021-07-12 DIAGNOSIS — N013 Rapidly progressive nephritic syndrome with diffuse mesangial proliferative glomerulonephritis: Secondary | ICD-10-CM

## 2021-07-16 ENCOUNTER — Other Ambulatory Visit: Payer: Self-pay | Admitting: Family Medicine

## 2021-08-02 ENCOUNTER — Other Ambulatory Visit: Payer: Self-pay

## 2021-08-02 ENCOUNTER — Emergency Department (HOSPITAL_COMMUNITY): Admission: EM | Admit: 2021-08-02 | Discharge: 2021-08-02 | Discharge status: Against Medical Advice | Payer: 59

## 2021-08-02 NOTE — ED Notes (Signed)
Patient approached stating that she "wouldn't be able to stay". Despite being told to stay and be seen by a doctor, patient LWBS.

## 2021-08-07 ENCOUNTER — Other Ambulatory Visit: Payer: Self-pay

## 2021-08-07 ENCOUNTER — Encounter (HOSPITAL_BASED_OUTPATIENT_CLINIC_OR_DEPARTMENT_OTHER): Payer: 59 | Admitting: Physician Assistant

## 2021-08-07 DIAGNOSIS — L98412 Non-pressure chronic ulcer of buttock with fat layer exposed: Secondary | ICD-10-CM | POA: Insufficient documentation

## 2021-08-07 DIAGNOSIS — N184 Chronic kidney disease, stage 4 (severe): Secondary | ICD-10-CM | POA: Diagnosis not present

## 2021-08-07 DIAGNOSIS — N013 Rapidly progressive nephritic syndrome with diffuse mesangial proliferative glomerulonephritis: Secondary | ICD-10-CM | POA: Diagnosis not present

## 2021-08-07 DIAGNOSIS — D631 Anemia in chronic kidney disease: Secondary | ICD-10-CM | POA: Diagnosis not present

## 2021-08-07 DIAGNOSIS — E11622 Type 2 diabetes mellitus with other skin ulcer: Secondary | ICD-10-CM | POA: Insufficient documentation

## 2021-08-07 NOTE — Progress Notes (Signed)
Cheryl, Scott ([id number]) Visit Report for 08/07/2021 Abuse/Suicide Risk Screen Details Patient Name: Date of Service: Cheryl Scott [name] 08/07/2021 8:30 A M Medical Record Number: [id number] Patient Account Number: [id number] Date of Birth/Sex: Treating RN: 1970-08-13 (51 y.o. Cheryl Scott Primary Care [place] President: Shanon Rosser Other Clinician: Referring Jazlynn Nemetz: Treating Courtni Balash/Extender: Edythe Clarity Weeks in Treatment: 0 Abuse/Suicide Risk Screen Items Answer ABUSE RISK SCREEN: Has anyone close to you tried to hurt or harm you recentlyo No Do you feel uncomfortable with anyone in your familyo No Has anyone forced you do things that you didnt want to doo No Electronic Signature(s) Signed: 08/07/2021 4:38:38 PM By: Lorrin Jackson Entered By: Lorrin Jackson on 08/07/2021 09:10:53 -------------------------------------------------------------------------------- Activities of Daily Living Details Patient Name: Date of Service: Cheryl Scott [name] 08/07/2021 8:30 A M Medical Record Number: [id number] Patient Account Number: [id number] Date of Birth/Sex: Treating RN: 07/30/1970 (51 y.o. Cheryl Scott Primary Care [place] Shampine: Shanon Rosser Other Clinician: Referring Chudney Scheffler: Treating Latosha Gaylord/Extender: Edythe Clarity Weeks in Treatment: 0 Activities of Daily Living Items Answer Activities of Daily Living (Please select one for each item) Drive Automobile Completely Able T Medications ake Completely Able Use T elephone Completely Able Care for Appearance Completely Able Use T oilet Completely Able Bath / Shower Completely Able Dress Self Completely Able Feed Self Completely Able Walk Completely Able Get In / Out Bed Completely Able Housework Completely Able Prepare Meals Completely [place] for Self Completely Able Electronic Signature(s) Signed: 08/07/2021 4:38:38 PM By: Lorrin Jackson Entered By: Lorrin Jackson on 08/07/2021 09:11:14 -------------------------------------------------------------------------------- Education Screening Details Patient Name: Date of Service: Cheryl Scott, Cheryl Scott [name] 08/07/2021 8:30 A M Medical Record Number: [id number] Patient Account Number: [id number] Date of Birth/Sex: Treating RN: May 18, 1971 (51 y.o. Cheryl Scott Primary Care [place]ston [place]: Shanon Rosser Other Clinician: Referring Italo Banton: Treating Marquese Burkland/Extender: Marchelle Gearing in Treatment: 0 Primary Learner Assessed: Patient Learning Preferences/Education Level/Primary Language Learning Preference: Explanation, Demonstration, Printed Material Highest Education Level: College or Above Preferred Language: English Cognitive Barrier Language Barrier: No Translator Needed: No Memory Deficit: No Emotional Barrier: No Cultural/Religious Beliefs Affecting Medical Care: No Physical Barrier Impaired Vision: Yes Contacts Impaired Hearing: No Decreased Hand dexterity: No Knowledge/Comprehension Knowledge Level: Medium Comprehension Level: High Ability to understand written instructions: High Ability to understand verbal instructions: High Motivation Anxiety Level: Calm Cooperation: Cooperative Education Importance: Acknowledges Need Interest in Health Problems: Asks Questions Perception: Coherent Willingness to Engage in Self-Management High Activities: Readiness to Engage in Self-Management High Activities: Electronic Signature(s) Signed: 08/07/2021 4:38:38 PM By: Lorrin Jackson Entered By: Lorrin Jackson on 08/07/2021 09:11:46 -------------------------------------------------------------------------------- Fall Risk Assessment Details Patient Name: Date of Service: Cheryl Scott, Cheryl [name] 08/07/2021 8:30 A M Medical Record Number: [id number] Patient Account Number: [id number] Date of Birth/Sex: Treating RN: Aug 26, 1970 (51 y.o. Cheryl Scott Primary Care Jream Broyles: Shanon Rosser Other Clinician: Referring Shirleen Mcfaul: Treating Ridhima Golberg/Extender: Edythe Clarity Weeks in Treatment: 0 Fall Risk Assessment Items Have you had 2 or more falls in the last 12 monthso 0 No Have you had any fall that resulted in injury in the last 12 monthso 0 No FALLS RISK SCREEN History of falling - immediate or within 3 months 0 No Secondary diagnosis (Do you have 2 or more medical diagnoseso) 0 No Ambulatory aid None/bed rest/wheelchair/nurse 0 Yes Crutches/cane/walker 0 No Furniture 0 No Intravenous therapy Access/Saline/Heparin Lock 0 No Gait/Transferring Normal/ bed  rest/ wheelchair 0 Yes Weak (short steps with or without shuffle, stooped but able to lift head while walking, may seek 0 No support from furniture) Impaired (short steps with shuffle, may have difficulty arising from chair, head down, impaired 0 No balance) Mental Status Oriented to own ability 0 Yes Electronic Signature(s) Signed: 08/07/2021 4:38:38 PM By: Lorrin Jackson Entered By: Lorrin Jackson on 08/07/2021 09:11:57 -------------------------------------------------------------------------------- Foot Assessment Details Patient Name: Date of Service: Cheryl Scott, Cheryl [name] 08/07/2021 8:30 A M Medical Record Number: [id number] Patient Account Number: [id number] Date of Birth/Sex: Treating RN: 25-Dec-1970 (51 y.o. Cheryl Scott Primary Care Caralee Morea: Shanon Rosser Other Clinician: Referring Jacolby Risby: Treating Kasyn Rolph/Extender: Edythe Clarity Weeks in Treatment: 0 Foot Assessment Items Site Locations + = Sensation present, - = Sensation absent, C = Callus, U = Ulcer R = Redness, W = Warmth, M = Maceration, PU = Pre-ulcerative lesion F = Fissure, S = Swelling, D = Dryness Assessment Right: Left: Other Deformity: No No Prior Foot Ulcer: No No Prior Amputation: No No Charcot Joint: No No Ambulatory Status: Gait: Notes N/A: Buttock Wound Electronic Signature(s) Signed:  08/07/2021 4:38:38 PM By: Lorrin Jackson Entered By: Lorrin Jackson on 08/07/2021 09:12:23 -------------------------------------------------------------------------------- Nutrition Risk Screening Details Patient Name: Date of Service: Cheryl Scott [name] 08/07/2021 8:30 A M Medical Record Number: [id number] Patient Account Number: [id number] Date of Birth/Sex: Treating RN: Jan 01, 1971 (51 y.o. Cheryl Scott Primary Care Cinch Ormond: Shanon Rosser Other Clinician: Referring Bambie Pizzolato: Treating Zephyr Ridley/Extender: Loralie Champagne, Scott Weeks in Treatment: 0 Height (in): 67 Weight (lbs): 278 Body Mass Index (BMI): 43.5 Nutrition Risk Screening Items Score Screening NUTRITION RISK SCREEN: I have an illness or condition that made me change the kind and/or amount of food I eat 0 No I eat fewer than two meals per day 0 No I eat few fruits and vegetables, or milk products 0 No I have three or more drinks of beer, liquor or wine almost every day 0 No I have tooth or mouth problems that make it hard for me to eat 0 No I don't always have enough money to buy the food I need 0 No I eat alone most of the time 0 No I take three or more different prescribed or over-the-counter drugs a day 1 Yes Without wanting to, I have lost or gained 10 pounds in the last six months 0 No I am not always physically able to shop, cook and/or feed myself 0 No Nutrition Protocols Good Risk Protocol 0 No interventions needed Moderate Risk Protocol High Risk Proctocol Risk Level: Good Risk Score: 1 Electronic Signature(s) Signed: 08/07/2021 4:38:38 PM By: Lorrin Jackson Entered By: Lorrin Jackson on 08/07/2021 09:12:09

## 2021-08-07 NOTE — Progress Notes (Signed)
XOIE, KREUSER ([id number]) Visit Report for 08/07/2021 Biopsy Details Patient Name: Date of Service: Cheryl Scott Cheryl D. 08/07/2021 8:30 A M Medical Record Number: [id number] Patient Account Number: [id number] Date of Birth/Sex: Treating RN: 10/25/1970 (51 y.o. Cheryl Scott Primary Care Provider: Shanon Scott Other Clinician: Referring Provider: Treating Provider/Extender: Cheryl Scott Weeks in Treatment: 0 Biopsy Performed for: Wound #1 Gluteal fold Performed By: Physician Cheryl Keeler, PA Tissue Punch: Yes Size (mm): 3 Number of Specimens T aken: 1 Specimen Sent T Pathology: o Yes Level of Consciousness (Pre-procedure): Awake and Alert Pre-procedure Verification/Time-Out Taken: Yes - 10:10 Pain Control: Lidocaine Injectable Lidocaine Percent: 1% Instrument: Forceps Bleeding: Minimum Hemostasis Achieved: Pressure Response to Treatment: Procedure was tolerated well Level of Consciousness (Post-procedure): Awake and Alert Post Procedure Diagnosis Same as Pre-procedure Electronic Signature(s) Signed: 08/07/2021 4:38:38 PM By: Cheryl Scott Signed: 08/07/2021 5:05:22 PM By: Cheryl Keeler PA-C Entered By: Cheryl Scott on 08/07/2021 10:13:58 -------------------------------------------------------------------------------- Chief Complaint Document Details Patient Name: Date of Service: Cheryl Scott, Cheryl Scott Cheryl D. 08/07/2021 8:30 A M Medical Record Number: [id number] Patient Account Number: [id number] Date of Birth/Sex: Treating RN: [birth year] (51 y.o. Cheryl Scott Primary Care Provider: Shanon Scott Other Clinician: Referring Provider: Treating Provider/Extender: Cheryl Scott Weeks in Treatment: 0 Information Obtained from: Patient Chief Complaint Gluteal and perianal ulcers Electronic Signature(s) Signed: 08/07/2021 2:05:46 PM By: Cheryl Keeler PA-C Previous Signature: 08/07/2021 9:37:41 AM Version By: Cheryl Keeler PA-C Entered By:  Cheryl Scott on 08/07/2021 14:05:46 -------------------------------------------------------------------------------- Debridement Details Patient Name: Date of Service: Cheryl Scott, Cheryl Scott Cheryl D. 08/07/2021 8:30 A M Medical Record Number: [id number] Patient Account Number: [id number] Date of Birth/Sex: Treating RN: 1970/12/01 (51 y.o. Cheryl Scott Primary Care Provider: Shanon Scott Other Clinician: Referring Provider: Treating Provider/Extender: Cheryl Scott Weeks in Treatment: 0 Debridement Performed for Assessment: Wound #1 Gluteal fold Performed By: Physician Cheryl Keeler, PA Debridement Type: Chemical/Enzymatic/Mechanical Agent Used: gauze and wound cleanser Level of Consciousness (Pre-procedure): Awake and Alert Pre-procedure Verification/Time Out Yes - 10:00 Taken: Start Time: 10:01 Pain Control: Other : Benzocaine 20% Instrument: Other : gauze Bleeding: Minimum Hemostasis Achieved: Pressure End Time: 10:05 Response to Treatment: Procedure was tolerated well Level of Consciousness (Post- Awake and Alert procedure): Post Debridement Measurements of Total Wound Length: (cm) 8 Width: (cm) 6 Depth: (cm) 0.2 Volume: (cm) 7.54 Character of Wound/Ulcer Post Debridement: Stable Post Procedure Diagnosis Same as Pre-procedure Electronic Signature(s) Signed: 08/07/2021 4:38:38 PM By: Cheryl Scott Signed: 08/07/2021 5:05:22 PM By: Cheryl Keeler PA-C Entered By: Cheryl Scott on 08/07/2021 10:12:33 -------------------------------------------------------------------------------- HPI Details Patient Name: Date of Service: Cheryl Scott, Cheryl Scott Cheryl D. 08/07/2021 8:30 A M Medical Record Number: [id number] Patient Account Number: [id number] Date of Birth/Sex: Treating RN: Nov 01, 1970 (51 y.o. Cheryl Scott Primary Care Provider: Shanon Scott Other Clinician: Referring Provider: Treating Provider/Extender: Cheryl Scott Weeks in Treatment: 0 History  of Present Illness HPI Description: 08/07/2020 patient presents today for initial inspection here in the clinic concerning issues that she had following having COVID in May of last year. She tells me that she had issues with glomerulonephritis following and has subsequently been placed on CellCept along with prednisone by her nephrologist. Again with the kidney failure she has had a low GFR and recently the GFR has been around 22 at the last check. This is still low and really has not come back as much as they were hoping  to see so far. Nonetheless I do believe that based on what we are seeing currently the patient appears to be having a significant wound over the gluteal and perianal region. It was initially thought that this was likely to be more of a fungal infection. With that being said I do not really see evidence of that at this time it appears to me that this is more of an infectious process but my opinion is it is probably herpes simplex virus. I actually did bring Dr. Quentin Scott to see what he had to say as well he therefore was present during a portion of the evaluation and discussed some of the issues with the patient as well today. As a result of being on the prednisone the patient has also developed diabetes over the past year as well unfortunately. She also has hypertension and irritable bowel syndrome the last of this is actually causing her significant issues currently due to the fact that she has the open sores which are excruciatingly painful during defecation and wiping obviously. Again a week and a half ago she tells me nothing was here and this came on very quickly. She is obviously immune compromised secondary to the CellCept. She is actually been placed on several antifungal medications including Diflucan and nystatin she is also given Bactrim DS. None of this has really made any difference she is also currently telling me that she tries to clean the area as best she can but she is  not using any specific dressings. Electronic Signature(s) Signed: 08/07/2021 2:05:21 PM By: Cheryl Keeler PA-C Entered By: Cheryl Scott on 08/07/2021 14:05:20 -------------------------------------------------------------------------------- Physical Exam Details Patient Name: Date of Service: Cheryl Scott Cheryl D. 08/07/2021 8:30 A M Medical Record Number: [id number] Patient Account Number: [id number] Date of Birth/Sex: Treating RN: 1970-09-04 (51 y.o. Cheryl Scott Primary Care Provider: Shanon Scott Other Clinician: Referring Provider: Treating Provider/Extender: Cheryl Scott Weeks in Treatment: 0 Constitutional patient is hypertensive.. pulse regular and within target range for patient.Marland Kitchen respirations regular, non-labored and within target range for patient.Marland Kitchen temperature within target range for patient.. Well-nourished and well-hydrated in no acute distress. Eyes conjunctiva clear no eyelid edema noted. pupils equal round and reactive to light and accommodation. Ears, Nose, Mouth, and Throat no gross abnormality of ear auricles or external auditory canals. normal hearing noted during conversation. mucus membranes moist. Respiratory normal breathing without difficulty. Musculoskeletal normal gait and posture. no significant deformity or arthritic changes, no loss or range of motion, no clubbing. Psychiatric this patient is able to make decisions and demonstrates good insight into disease process. Alert and Oriented x 3. pleasant and cooperative. Notes Upon inspection patient's wound bed actually showed signs that appear to be erosive in nature as far as the wounds are concerned in the gluteal and perianal region. Unfortunately I think that this appears to likely be a HSV type infection. I am getting discussed with her and we did decide to proceed with doing a small punch biopsy I just did a 3 mm punch just to evaluate and see if there was anything more significant  going on at this point. With that being said my concern here is simply that the patient likely has HSV as the causative reason for the ulcerations in the perianal region and this is a significant issue in general. I think that we are getting need to try and see what we can do about getting this under control. If anything different shows up on  the biopsy then I will make adjustments as necessary. Electronic Signature(s) Signed: 08/07/2021 2:31:38 PM By: Cheryl Keeler PA-C Entered By: Cheryl Scott on 08/07/2021 14:31:37 -------------------------------------------------------------------------------- Physician Orders Details Patient Name: Date of Service: Cheryl Scott, Cheryl Scott Cheryl D. 08/07/2021 8:30 A M Medical Record Number: [id number] Patient Account Number: [id number] Date of Birth/Sex: Treating RN: 1971-06-28 (51 y.o. Cheryl Scott Primary Care Provider: Shanon Scott Other Clinician: Referring Provider: Treating Provider/Extender: Cheryl Scott Weeks in Treatment: 0 Verbal / Phone Orders: No Diagnosis Coding ICD-10 Coding Code Description B35.6 Tinea cruris L98.412 Non-pressure chronic ulcer of buttock with fat layer exposed E11.622 Type 2 diabetes mellitus with other skin ulcer I10 Essential (primary) hypertension K58.8 Other irritable bowel syndrome Follow-up Appointments ppointment in 1 week. - with Margarita Grizzle Return A Other: - Follow up with GYN Bathing/ Shower/ Hygiene May shower and wash wound with soap and water. Additional Orders / Instructions Follow Nutritious Diet Wound Treatment Wound #1 - Gluteal fold Cleanser: Soap and Water 1 x Per Day/30 Days Discharge Instructions: May shower and wash wound with dial antibacterial soap and water prior to dressing change. Prim Dressing: KerraCel Ag Gelling Fiber Dressing, 4x5 in (silver alginate) 1 x Per Day/30 Days ary Discharge Instructions: Apply silver alginate to wound bed as instructed Secondary Dressing: ABD Pad,  5x9 1 x Per Day/30 Days Discharge Instructions: Apply over primary dressing as directed. Laboratory Bacteria identified in Tissue by Biopsy culture (MICRO) - (ICD10 6028265601 - Non-pressure chronic ulcer of buttock with fat layer exposed) LOINC Code: [id number] Convenience Name: Biopsy specimen culture Patient Medications llergies: codeine, penicillin, Seroquel, latex, ACE Inhibitors, Lasix A Notifications Medication Indication Start End 08/07/2021 Valtrex DOSE 1 - oral 1 gram tablet - 1 tablet oral taken 1 time per day for 10 days. dose adjusted for kidney function Electronic Signature(s) Signed: 08/07/2021 10:41:29 AM By: Cheryl Keeler PA-C Entered By: Cheryl Scott on 08/07/2021 10:41:28 Prescription 08/07/2021 -------------------------------------------------------------------------------- Ike Bene D. Cheryl Scott [place] Patient Name: Provider: 1971/01/07 [id number] Date of Birth: NPI#: [id number] Sex: DEA #: [phone] Phone #: License #: [place] Patient Address: [address], [place] [address] [place], [place] [address] [phone] Allergies codeine; penicillin; Seroquel; latex; ACE Inhibitors; Lasix Provider's Orders Bacteria identified in Tissue by Biopsy culture - ICD10: L98.412 LOINC Code: 20474-3 Convenience Name: Biopsy specimen culture Hand Signature: Date(s): Electronic Signature(s) Signed: 08/07/2021 5:05:22 PM By: Cheryl Keeler PA-C Entered By: Cheryl Scott on 08/07/2021 10:41:29 -------------------------------------------------------------------------------- Problem List Details Patient Name: Date of Service: Cheryl Scott, Cheryl Scott Cheryl D. 08/07/2021 8:30 A M Medical Record Number: [id number] Patient Account Number: [id number] Date of Birth/Sex: Treating RN: May 11, 1971 (51 y.o. 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 08/07/2021 No Yes L98.412 Non-pressure chronic ulcer of buttock with fat layer exposed 08/07/2021 No Yes E11.622 Type 2 diabetes mellitus with other skin ulcer 08/07/2021 No Yes I10 Essential (primary) hypertension 08/07/2021 No Yes K58.8 Other irritable bowel syndrome 08/07/2021 No Yes Inactive Problems Resolved Problems Electronic Signature(s) Signed: 08/07/2021 2:04:43 PM By: Cheryl Keeler PA-C Previous Signature: 08/07/2021 9:33:17 AM Version By: Cheryl Keeler PA-C Previous Signature: 08/07/2021 9:30:01 AM Version By: Cheryl Keeler PA-C Entered By: Cheryl Scott on 08/07/2021 14:04:43 --------------------------------------------------------------------------------  Progress Note Details Patient Name: Date of Service: Cheryl Scott Cheryl D. 08/07/2021 8:30 A M Medical Record Number: [id number] Patient Account Number: [id number] Date of Birth/Sex: Treating RN: 04-25-1971 (51 y.o. Cheryl Scott Primary Care Provider: Shanon Scott Other Clinician: Referring Provider: Treating Provider/Extender: Cheryl Scott Weeks in Treatment: 0 Subjective Chief Complaint Information obtained from Patient Gluteal and perianal ulcers History of Present Illness (HPI) 08/07/2020 patient presents today for initial inspection here in the clinic concerning issues that she had following having COVID in May of last year. She tells me that she had issues with glomerulonephritis following and has subsequently been placed on CellCept along with prednisone by her nephrologist. Again with the kidney failure she has had a low GFR and recently the GFR has been around 22 at the last check. This is still low and really has not come back as much as they were hoping to see so far. Nonetheless I do  believe that based on what we are seeing currently the patient appears to be having a significant wound over the gluteal and perianal region. It was initially thought that this was likely to be more of a fungal infection. With that being said I do not really see evidence of that at this time it appears to me that this is more of an infectious process but my opinion is it is probably herpes simplex virus. I actually did bring Dr. Quentin Scott to see what he had to say as well he therefore was present during a portion of the evaluation and discussed some of the issues with the patient as well today. As a result of being on the prednisone the patient has also developed diabetes over the past year as well unfortunately. She also has hypertension and irritable bowel syndrome the last of this is actually causing her significant issues currently due to the fact that she has the open sores which are excruciatingly painful during defecation and wiping obviously. Again a week and a half ago she tells me nothing was here and this came on very quickly. She is obviously immune compromised secondary to the CellCept. She is actually been placed on several antifungal medications including Diflucan and nystatin she is also given Bactrim DS. None of this has really made any difference she is also currently telling me that she tries to clean the area as best she can but she is not using any specific dressings. Patient History Information obtained from Patient. Allergies codeine, penicillin, Seroquel, latex, ACE Inhibitors, Lasix Family History Cancer - Siblings, Diabetes - Mother,Maternal Grandparents,Siblings, Heart Disease - Father, Hypertension - Father, Kidney Disease - Siblings, Stroke - Maternal Grandparents, No family history of Hereditary Spherocytosis, Lung Disease, Seizures, Thyroid Problems, Tuberculosis. Social History Former smoker, Marital Status - Married, Alcohol Use - Never, Drug Use - No History,  Caffeine Use - Rarely. Medical History Hematologic/Lymphatic Patient has history of Anemia Respiratory Patient has history of Sleep Apnea Cardiovascular Patient has history of Hypertension Endocrine Patient has history of Type II Diabetes - Medication Induced Musculoskeletal Patient has history of Osteoarthritis Patient is treated with Insulin. Blood sugar is tested. 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 Ear/Nose/Mouth/Throat Denies complaints or symptoms of Chronic sinus problems or rhinitis. Respiratory Denies complaints or symptoms of Chronic or frequent coughs, Shortness of Breath. Integumentary (Skin) Complains  or has symptoms of Wounds. Neurologic Denies complaints or symptoms of Numbness/parasthesias. Objective Constitutional patient is hypertensive.. pulse regular and within target range for patient.Marland Kitchen respirations regular, non-labored and within target range for patient.Marland Kitchen temperature within target range for patient.. Well-nourished and well-hydrated in no acute distress. Vitals Time Taken: 8:49 AM, Height: 67 in, Source: Stated, Weight: 278 lbs, Source: Stated, BMI: 43.5, Temperature: 98 F, Pulse: 90 bpm, Respiratory Rate: 18 breaths/min, Blood Pressure: 168/84 mmHg, Capillary Blood Glucose: 224 mg/dl. Eyes conjunctiva clear no eyelid edema noted. pupils equal round and reactive to light and accommodation. Ears, Nose, Mouth, and Throat no gross abnormality of ear auricles or external auditory canals. normal hearing noted during conversation. mucus membranes moist. Respiratory normal breathing without difficulty. Musculoskeletal normal gait and posture. no significant deformity or arthritic  changes, no loss or range of motion, no clubbing. Psychiatric this patient is able to make decisions and demonstrates good insight into disease process. Alert and Oriented x 3. pleasant and cooperative. General Notes: Upon inspection patient's wound bed actually showed signs that appear to be erosive in nature as far as the wounds are concerned in the gluteal and perianal region. Unfortunately I think that this appears to likely be a HSV type infection. I am getting discussed with her and we did decide to proceed with doing a small punch biopsy I just did a 3 mm punch just to evaluate and see if there was anything more significant going on at this point. With that being said my concern here is simply that the patient likely has HSV as the causative reason for the ulcerations in the perianal region and this is a significant issue in general. I think that we are getting need to try and see what we can do about getting this under control. If anything different shows up on the biopsy then I will make adjustments as necessary. Integumentary (Hair, Skin) Wound #1 status is Open. Original cause of wound was Gradually Appeared. The date acquired was: 07/27/2021. The wound is located on the Gluteal fold. The wound measures 8cm length x 6cm width x 0.2cm depth; 37.699cm^2 area and 7.54cm^3 volume. There is Fat Layer (Subcutaneous Tissue) exposed. There is no tunneling or undermining noted. There is a medium amount of serosanguineous drainage noted. The wound margin is distinct with the outline attached to the wound base. There is medium (34-66%) red granulation within the wound bed. There is a medium (34-66%) amount of necrotic tissue within the wound bed including Adherent Slough. 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 There was a Chemical/Enzymatic/Mechanical debridement performed by Cheryl Keeler, PA. With the following instrument(s): gauze to remove Non-Viable tissue/material. Material removed includes Skin: Dermis after achieving pain control using Other (Benzocaine 20%). Other agent used was gauze and wound cleanser. A time out was conducted at 10:00, prior to the start of the procedure. A Minimum amount of bleeding was controlled with Pressure. The procedure was tolerated well. Post Debridement Measurements: 8cm length x 6cm width x 0.2cm depth; 7.54cm^3 volume. Character of Wound/Ulcer Post Debridement is stable. Post procedure Diagnosis Wound #1: Same as Pre-Procedure Pre-procedure diagnosis of Wound #1 is a Fungal located on the Gluteal fold . There was a biopsy performed by Cheryl Keeler, PA. The  skin was cleansed and prepped with anti-septic followed by pain control using Lidocaine Injectable: 1%. Utilizing a 3 mm tissue punch, tissue was removed at its base with the following instrument(s): Forceps and sent to pathology. A Minimum amount of bleeding was controlled with Pressure. A time out was conducted at 10:10, prior to the start of the procedure. The procedure was tolerated well. Post procedure Diagnosis Wound #1: Same as Pre-Procedure Plan Follow-up Appointments: Return Appointment in 1 week. - with Margarita Grizzle Other: - Follow up with GYN Bathing/ Shower/ Hygiene: May shower and wash wound with soap and water. Additional Orders / Instructions: Follow Nutritious Diet Laboratory ordered were: Biopsy specimen culture The following medication(s) was prescribed: Valtrex oral 1 gram tablet 1 1 tablet oral taken 1 time per day for 10 days. dose adjusted for kidney function starting 08/07/2021 WOUND #1: - Gluteal fold Wound Laterality: Cleanser: Soap and Water 1 x Per Day/30  Days Discharge Instructions: May shower and wash wound with dial antibacterial soap and water prior to dressing change. Prim Dressing: KerraCel Ag Gelling Fiber Dressing, 4x5 in (silver alginate) 1 x Per Day/30 Days ary Discharge Instructions: Apply silver alginate to wound bed as instructed Secondary Dressing: ABD Pad, 5x9 1 x Per Day/30 Days Discharge Instructions: Apply over primary dressing as directed. 1. Recommend at this point based on what I am seeing that we go ahead and initiate treatment with a silver alginate dressing tucked into the gluteal/perianal region in order to catch any excess drainage try to keep this as clean as possible. This can be a daunting task due to the locality of the wounds to be perfectly honest. 2. I am also can recommend based on what I see and the fact that at least high in the differential is HSV that we should go ahead and place her on Valtrex. Due to the decreased renal function which was at 22 from the GFR last time this was evaluated I would actually drop this down to 1000 mg 1 time per day for this normally would have been to 2 times per day dosing for an immunocompromise patient. This will be for the next 10 days. 3. I am also going to suggest based on what we are seeing currently that we really need to have this tested for HSV and again being that I cannot do that here in the clinic as I do not have the resources in order to proceed as such I did actually contact her wound care provider's office which was Dr. Tressia Danas. Subsequently she did not have any openings to be seen as she has been out of the office for 2 weeks. Nonetheless the nurse practitioner in the clinic who I here is really good is good to be evaluating this patient and subsequently can do the culture as well. I think that is ideal and we will see where things stand and what that shows. In the meantime she will still have the medication per above to try to get things started  prophylactically. We will see patient back for reevaluation in 1 week here in the clinic. If anything worsens or changes patient will contact our office for additional recommendations. Electronic Signature(s) Signed: 08/07/2021 2:33:37 PM By: Cheryl Keeler PA-C Entered By: Cheryl Scott on 08/07/2021 14:33:37 -------------------------------------------------------------------------------- HxROS Details Patient Name: Date of Service: Cheryl Scott, Cheryl Scott Cheryl D. 08/07/2021 8:30 A M Medical Record Number: [id number] Patient Account Number: [id number] Date of Birth/Sex: Treating RN: 04-Jan-1971 (51 y.o. Cheryl Scott Primary Care  Provider: Shanon Scott Other Clinician: Referring Provider: Treating Provider/Extender: Cheryl Scott Weeks in Treatment: 0 Information Obtained From Patient Eyes Complaints and Symptoms: Positive for: Glasses / Contacts - contacts Ear/Nose/Mouth/Throat Complaints and Symptoms: Negative for: Chronic sinus problems or rhinitis Respiratory Complaints and Symptoms: Negative for: Chronic or frequent coughs; Shortness of Breath Medical History: Positive for: Sleep Apnea Integumentary (Skin) Complaints and Symptoms: Positive for: Wounds Neurologic Complaints and Symptoms: Negative for: Numbness/parasthesias Hematologic/Lymphatic Medical History: Positive for: Anemia Cardiovascular Medical History: Positive for: Hypertension Gastrointestinal Medical History: Past Medical History Notes: GERD-Gastric Ulcer-IBS Endocrine Medical History: Positive for: Type II Diabetes - Medication Induced Treated with: Insulin Blood sugar tested every day: Yes Tested : Daily Genitourinary Medical History: Past Medical History Notes: Glomerulonephritis Immunological Medical History: Past Medical History Notes: 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 Musculoskeletal Medical History: Positive for: Osteoarthritis Past Medical History Notes: Cervical Nerve  Impingement Oncologic Psychiatric Medical History: Past Medical History Notes: OCD Immunizations Pneumococcal Vaccine: Received Pneumococcal Vaccination: Yes Received Pneumococcal Vaccination On or After 60th Birthday: No Implantable Devices None Family and Social History Cancer: Yes - Siblings; Diabetes: Yes - Mother,Maternal Grandparents,Siblings; Heart Disease: Yes - Father; Hereditary Spherocytosis: No; Hypertension: Yes - Father; Kidney Disease: Yes - Siblings; Lung Disease: No; Seizures: No; Stroke: Yes - Maternal Grandparents; Thyroid Problems: No; Tuberculosis: No; Former smoker; Marital Status - Married; Alcohol Use: Never; Drug Use: No History; Caffeine Use: Rarely; Financial Concerns: No; Food, Clothing or Shelter Needs: No; Support System Lacking: No; Transportation Concerns: No Electronic Signature(s) Signed: 08/07/2021 4:38:38 PM By: Cheryl Scott Signed: 08/07/2021 5:05:22 PM By: Cheryl Keeler PA-C Entered By: Cheryl Scott on 08/07/2021 09:27:37 -------------------------------------------------------------------------------- [place] Details Patient Name: Date of Service: Cheryl Scott, [place] D. 08/07/2021 Medical Record Number: [id number] Patient Account Number: [id number] Date of Birth/Sex: Treating RN: 22-Jun-1971 (51 y.o. Cheryl Scott Primary Care Provider: Shanon Scott Other Clinician: Referring Provider: Treating Provider/Extender: Cheryl Scott Weeks in Treatment: 0 Diagnosis Coding ICD-10 Codes Code Description B35.6 Tinea cruris L98.412 Non-pressure chronic ulcer of buttock with fat layer exposed E11.622 Type 2 diabetes mellitus with other skin ulcer I10 Essential (primary) hypertension K58.8 Other irritable bowel syndrome Facility Procedures CPT4 Code: 08811031 Description: 99213 - WOUND CARE VISIT-LEV 3 EST PT Modifier: Quantity: 1 Physician Procedures : CPT4 Code Description Modifier [id number] - WC PHYS LEVEL 4 - NEW PT  ICD-10 Diagnosis Description B35.6 Tinea cruris L98.412 Non-pressure chronic ulcer of buttock with fat layer exposed E11.622 Type 2 diabetes mellitus with other skin ulcer I10  Essential (primary) hypertension Quantity: 1 Electronic Signature(s) Signed: 08/07/2021 2:34:08 PM By: Cheryl Keeler PA-C Entered By: Cheryl Scott on 08/07/2021 14:34:07

## 2021-08-07 NOTE — Progress Notes (Signed)
Cheryl Scott, Cheryl Scott ([id number]) Visit Report for 08/07/2021 Allergy List Details Patient Name: Date of Service: Cheryl Scott [name] 08/07/2021 8:30 A M Medical Record Number: [id number] Patient Account Number: [id number] Date of Birth/Sex: Treating RN: [birth year] (51 y.o. [name] Primary Care Sheanna Dail: Shanon Rosser Other Clinician: Referring Dalynn Jhaveri: Treating Chaslyn Eisen/Extender: Loralie Champagne, Scott Weeks in Treatment: 0 Allergies Active Allergies codeine penicillin Seroquel latex ACE Inhibitors Lasix Allergy Notes Electronic Signature(s) Signed: 08/07/2021 4:38:38 PM By: Lorrin Jackson Entered By: Lorrin Jackson on 08/07/2021 08:58:09 -------------------------------------------------------------------------------- Arrival Information Details Patient Name: Date of Service: Cheryl Scott, [place] [name] 08/07/2021 8:30 A M Medical Record Number: [id number] Patient Account Number: [id number] Date of Birth/Sex: Treating RN: 01/09/1971 (51 y.o. [name] Primary Care Elzia Hott: Shanon Rosser Other Clinician: Referring Vale Peraza: Treating Ishmael Berkovich/Extender: Marchelle Gearing in Treatment: 0 Visit Information Patient Arrived: Ambulatory Arrival Time: 08:45 Transfer Assistance: None Patient Identification Verified: Yes Secondary Verification Process Completed: Yes Patient Requires Transmission-Based Precautions: No Patient Has Alerts: No Electronic Signature(s) Signed: 08/07/2021 4:38:38 PM By: Lorrin Jackson Entered By: Lorrin Jackson on 08/07/2021 08:49:31 -------------------------------------------------------------------------------- Clinic Level of Care Assessment Details Patient Name: Date of Service: Cheryl Scott [name] 08/07/2021 8:30 A M Medical Record Number: [id number] Patient Account Number: [id number] Date of Birth/Sex: Treating RN: 01-13-1971 (51 y.o. [name] Primary Care Luciel Brickman: Shanon Rosser Other Clinician: Referring  Latoya Maulding: Treating Niya Behler/Extender: Edythe Clarity Weeks in Treatment: 0 Clinic Level of Care Assessment Items TOOL 1 Quantity Score X- 1 0 Use when EandM and Procedure is performed on INITIAL visit ASSESSMENTS - Nursing Assessment / Reassessment X- 1 20 General Physical Exam (combine w/ comprehensive assessment (listed just below) when performed on new pt. evals) X- 1 25 Comprehensive Assessment (HX, ROS, Risk Assessments, Wounds Hx, etc.) ASSESSMENTS - Wound and Skin Assessment / Reassessment []  - 0 Dermatologic / Skin Assessment (not related to wound area) ASSESSMENTS - Ostomy and/or Continence Assessment and Care []  - 0 Incontinence Assessment and Management []  - 0 Ostomy Care Assessment and Management (repouching, etc.) PROCESS - Coordination of Care []  - 0 Simple Patient / Family Education for ongoing care X- 1 20 Complex (extensive) Patient / Family Education for ongoing care X- 1 10 Staff obtains Programmer, systems, Records, T Results / Process Orders est X- 1 10 Staff telephones HHA, Nursing Homes / Clarify orders / etc []  - 0 Routine Transfer to another Facility (non-emergent condition) []  - 0 Routine Hospital Admission (non-emergent condition) []  - 0 New Admissions / Biomedical engineer / Ordering NPWT Apligraf, etc. , []  - 0 Emergency Hospital Admission (emergent condition) PROCESS - Special Needs []  - 0 Pediatric / Minor Patient Management []  - 0 Isolation Patient Management []  - 0 Hearing / Language / Visual special needs []  - 0 Assessment of Community assistance (transportation, D/C planning, etc.) []  - 0 Additional assistance / Altered mentation []  - 0 Support Surface(s) Assessment (bed, cushion, seat, etc.) INTERVENTIONS - Miscellaneous []  - 0 External ear exam []  - 0 Patient Transfer (multiple staff / Civil Service fast streamer / Similar devices) []  - 0 Simple Staple / Suture removal (25 or less) []  - 0 Complex Staple / Suture removal (26 or  more) []  - 0 Hypo/Hyperglycemic Management (do not check if billed separately) []  - 0 Ankle / Brachial Index (ABI) - do not check if billed separately Has the patient been seen at the hospital within the last three years: Yes Total Score: 85 Level  Of Care: New/Established - Level 3 Electronic Signature(s) Signed: 08/07/2021 4:38:38 PM By: Lorrin Jackson Signed: 08/07/2021 4:38:38 PM By: Fara Chute By: Lorrin Jackson on 08/07/2021 10:29:42 -------------------------------------------------------------------------------- Encounter Discharge Information Details Patient Name: Date of Service: Cheryl Scott, [place] [name] 08/07/2021 8:30 A M Medical Record Number: [id number] Patient Account Number: [id number] Date of Birth/Sex: Treating RN: [birth year] (51 y.o. [name] Primary Care Mareta Chesnut: Shanon Rosser Other Clinician: Referring Teal Raben: Treating Latorsha Curling/Extender: Edythe Clarity Weeks in Treatment: 0 Encounter Discharge Information Items Post Procedure Vitals Discharge Condition: Stable Temperature (F): 98.0 Ambulatory Status: Ambulatory Pulse (bpm): 90 Discharge Destination: Home Respiratory Rate (breaths/min): 18 Transportation: Private Auto Blood Pressure (mmHg): 168/84 Schedule Follow-up Appointment: Yes Clinical Summary of Care: Provided on 08/07/2021 Form Type Recipient Paper Patient Patient Electronic Signature(s) Signed: 08/07/2021 4:38:38 PM By: Lorrin Jackson Entered By: Lorrin Jackson on 08/07/2021 10:39:38 -------------------------------------------------------------------------------- Lower Extremity Assessment Details Patient Name: Date of Service: Cheryl Scott, [name] [name] 08/07/2021 8:30 A M Medical Record Number: [id number] Patient Account Number: [id number] Date of Birth/Sex: Treating RN: 09-19-1970 (51 y.o. [name] Primary Care Charisse Wendell: Shanon Rosser Other Clinician: Referring Fernande Treiber: Treating Jamirra Curnow/Extender: Loralie Champagne, Scott Weeks in Treatment: 0 Notes N/A: Buttock wound Electronic Signature(s) Signed: 08/07/2021 4:38:38 PM By: Lorrin Jackson Entered By: Lorrin Jackson on 08/07/2021 09:12:39 -------------------------------------------------------------------------------- Multi Wound Chart Details Patient Name: Date of Service: Cheryl Scott, SA [name] 08/07/2021 8:30 A M Medical Record Number: [id number] Patient Account Number: [id number] Date of Birth/Sex: Treating RN: March 03, 1971 (51 y.o. [name] Primary Care Whitney Bingaman: Shanon Rosser Other Clinician: Referring Mamie Diiorio: Treating Meila Berke/Extender: Loralie Champagne, Scott Weeks in Treatment: 0 Vital Signs Height(in): 67 Capillary Blood Glucose(mg/dl): 224 Weight(lbs): 278 Pulse(bpm): 90 Body Mass Index(BMI): 44 Blood Pressure(mmHg): 168/84 Temperature(F): 98 Respiratory Rate(breaths/min): 18 Photos: [N/A:N/A] Gluteal fold N/A N/A Wound Location: Gradually Appeared N/A N/A Wounding Event: Fungal N/A N/A Primary Etiology: Anemia, Sleep Apnea, Hypertension, N/A N/A Comorbid History: Type II Diabetes, Osteoarthritis 07/27/2021 N/A N/A Date Acquired: 0 N/A N/A Weeks of Treatment: Open N/A N/A Wound Status: 8x6x0.2 N/A N/A Measurements L x W x D (cm) 37.699 N/A N/A A (cm) : rea 7.54 N/A N/A Volume (cm) : -20.00% N/A N/A % Reduction in A rea: -20.00% N/A N/A % Reduction in Volume: Full Thickness Without Exposed N/A N/A Classification: Support Structures Medium N/A N/A Exudate A mount: Serosanguineous N/A N/A Exudate Type: red, brown N/A N/A Exudate Color: Distinct, outline attached N/A N/A Wound Margin: Medium (34-66%) N/A N/A Granulation A mount: Red N/A N/A Granulation Quality: Medium (34-66%) N/A N/A Necrotic A mount: Fat Layer (Subcutaneous Tissue): Yes N/A N/A Exposed Structures: Fascia: No Tendon: No Muscle: No Joint: No Bone: No None N/A  N/A Epithelialization: Chemical/Enzymatic/Mechanical - N/A N/A Debridement: Selective/Open Wound Pre-procedure Verification/Time Out 10:00 N/A N/A Taken: Other N/A N/A Pain Control: Other(gauze) N/A N/A Instrument: Minimum N/A N/A Bleeding: Pressure N/A N/A Hemostasis A chieved: Procedure was tolerated well N/A N/A Debridement Treatment Response: 8x6x0.2 N/A N/A Post Debridement Measurements L x W x D (cm) 7.54 N/A N/A Post Debridement Volume: (cm) Biopsy N/A N/A Procedures Performed: Debridement Treatment Notes Wound #1 (Gluteal fold) Cleanser Soap and Water Discharge Instruction: May shower and wash wound with dial antibacterial soap and water prior to dressing change. Peri-Wound Care Topical Primary Dressing KerraCel Ag Gelling Fiber Dressing, 4x5 in (silver alginate) Discharge Instruction: Apply silver alginate to wound bed as instructed Secondary Dressing ABD Pad, 5x9 Discharge Instruction:  Apply over primary dressing as directed. Secured With Compression Wrap Compression Stockings Add-Ons Electronic Signature(s) Signed: 08/07/2021 4:23:28 PM By: Lorrin Jackson Entered By: Lorrin Jackson on 08/07/2021 16:23:28 -------------------------------------------------------------------------------- Multi-Disciplinary Care Plan Details Patient Name: Date of Service: Cheryl Scott, SA [name] 08/07/2021 8:30 A M Medical Record Number: [id number] Patient Account Number: [id number] Date of Birth/Sex: Treating RN: 04-25-1971 (51 y.o. [name] Primary Care Dorothea Yow: Shanon Rosser Other Clinician: Referring Roniesha Hollingshead: Treating Teryl Mcconaghy/Extender: Edythe Clarity Weeks in Treatment: 0 Active Inactive Wound/Skin Impairment Nursing Diagnoses: Impaired tissue integrity Goals: Patient/caregiver will verbalize understanding of skin care regimen Date Initiated: 08/07/2021 Target Resolution Date: 09/11/2021 Goal Status: Active Ulcer/skin breakdown will have a  volume reduction of 30% by week 4 Date Initiated: 08/07/2021 Target Resolution Date: 09/11/2021 Goal Status: Active Interventions: Assess patient/caregiver ability to obtain necessary supplies Assess patient/caregiver ability to perform ulcer/skin care regimen upon admission and as needed Assess ulceration(s) every visit Provide education on ulcer and skin care Treatment Activities: Topical wound management initiated : 08/07/2021 Notes: Electronic Signature(s) Signed: 08/07/2021 4:38:38 PM By: Lorrin Jackson Entered By: Lorrin Jackson on 08/07/2021 09:23:56 -------------------------------------------------------------------------------- Pain Assessment Details Patient Name: Date of Service: Cheryl Scott, [name] [name] 08/07/2021 8:30 A M Medical Record Number: [id number] Patient Account Number: [id number] Date of Birth/Sex: Treating RN: 14-Aug-1970 (51 y.o. [name] Primary Care Skyylar Kopf: Other Clinician: Shanon Rosser Referring Leomar Westberg: Treating Fender Herder/Extender: Edythe Clarity Weeks in Treatment: 0 Active Problems Location of Pain Severity and Description of Pain Patient Has Paino Yes Site Locations Pain Location: Pain in Ulcers With Dressing Change: Yes Duration of the Pain. Constant / Intermittento Intermittent Rate the pain. Current Pain Level: 8 Character of Pain Describe the Pain: Burning, Throbbing Pain Management and Medication Current Pain Management: Medication: Yes Cold Application: No Rest: Yes Massage: No Activity: No T.E.N.S.: No Heat Application: No Leg drop or elevation: No Is the Current Pain Management Adequate: Adequate How does your wound impact your activities of daily livingo Sleep: Yes Bathing: No Appetite: No Relationship With Others: No Bladder Continence: No Emotions: No Bowel Continence: No Work: No Toileting: No Drive: No Dressing: No Hobbies: No Electronic Signature(s) Signed: 08/07/2021 4:38:38 PM By: Lorrin Jackson Entered By: Lorrin Jackson on 08/07/2021 09:22:53 -------------------------------------------------------------------------------- Patient/Caregiver Education Details Patient Name: Date of Service: Cheryl Scott [place] D. 1/11/2023andnbsp8:30 A M Medical Record Number: [id number] Patient Account Number: [id number] Date of Birth/Gender: Treating RN: 1970-09-03 (51 y.o. [name] Primary Care Physician: Shanon Rosser Other Clinician: Referring Physician: Treating Physician/Extender: Marchelle Gearing in Treatment: 0 Education Assessment Education Provided To: Patient Education Topics Provided Nutrition: Methods: Explain/Verbal, Printed Responses: State content correctly Wound/Skin Impairment: Methods: Explain/Verbal, Printed Responses: State content correctly Electronic Signature(s) Signed: 08/07/2021 4:38:38 PM By: Lorrin Jackson Entered By: Lorrin Jackson on 08/07/2021 09:24:17 -------------------------------------------------------------------------------- Wound Assessment Details Patient Name: Date of Service: Cheryl Scott, [name] [name] 08/07/2021 8:30 A M Medical Record Number: [id number] Patient Account Number: [id number] Date of Birth/Sex: Treating RN: [birth year] (51 y.o. [name] Primary Care Manie Bealer: Shanon Rosser Other Clinician: Referring Steve Gregg: Treating Perkins Molina/Extender: Edythe Clarity Weeks in Treatment: 0 Wound Status Wound Number: 1 Primary Fungal Etiology: Wound Location: Gluteal fold Wound Status: Open Wounding Event: Gradually Appeared Comorbid Anemia, Sleep Apnea, Hypertension, Type II Diabetes, Date Acquired: 07/27/2021 History: Osteoarthritis Weeks Of Treatment: 0 Clustered Wound: No Photos Wound Measurements Length: (cm) 8 Width: (cm) 6 Depth: (cm) 0.2 Area: (cm) 37.699 Volume: (  cm) 7.54 % Reduction in Area: -20% % Reduction in Volume: -20% Epithelialization: None Tunneling: No Undermining:  No Wound Description Classification: Full Thickness Without Exposed Support Structures Wound Margin: Distinct, outline attached Exudate Amount: Medium Exudate Type: Serosanguineous Exudate Color: red, brown Foul Odor After Cleansing: No Slough/Fibrino Yes Wound Bed Granulation Amount: Medium (34-66%) Exposed Structure Granulation Quality: Red Fascia Exposed: No Necrotic Amount: Medium (34-66%) Fat Layer (Subcutaneous Tissue) Exposed: Yes Necrotic Quality: Adherent Slough Tendon Exposed: No Muscle Exposed: No Joint Exposed: No Bone Exposed: No Electronic Signature(s) Signed: 08/07/2021 4:38:38 PM By: Lorrin Jackson Entered By: Lorrin Jackson on 08/07/2021 09:21:21 -------------------------------------------------------------------------------- Vitals Details Patient Name: Date of Service: Cheryl Scott, SA [name] 08/07/2021 8:30 A M Medical Record Number: [id number] Patient Account Number: [id number] Date of Birth/Sex: Treating RN: April 08, 1971 (51 y.o. [name] Primary Care Louie Flenner: Shanon Rosser Other Clinician: Referring Tyriq Moragne: Treating Ashrith Sagan/Extender: Edythe Clarity Weeks in Treatment: 0 Vital Signs Time Taken: 08:49 Temperature (F): 98 Height (in): 67 Pulse (bpm): 90 Source: Stated Respiratory Rate (breaths/min): 18 Weight (lbs): 278 Blood Pressure (mmHg): 168/84 Source: Stated Capillary Blood Glucose (mg/dl): 224 Body Mass Index (BMI): 43.5 Reference Range: 80 - 120 mg / dl Electronic Signature(s) Signed: 08/07/2021 4:38:38 PM By: Lorrin Jackson Entered By: Lorrin Jackson on 08/07/2021 08:54:00

## 2021-08-13 ENCOUNTER — Encounter (HOSPITAL_COMMUNITY): Admission: RE | Admit: 2021-08-13 | Payer: 59 | Source: Ambulatory Visit

## 2021-08-14 ENCOUNTER — Encounter (HOSPITAL_BASED_OUTPATIENT_CLINIC_OR_DEPARTMENT_OTHER): Payer: 59 | Admitting: Internal Medicine

## 2021-08-14 ENCOUNTER — Other Ambulatory Visit: Payer: Self-pay

## 2021-08-14 DIAGNOSIS — N013 Rapidly progressive nephritic syndrome with diffuse mesangial proliferative glomerulonephritis: Secondary | ICD-10-CM | POA: Diagnosis not present

## 2021-08-14 NOTE — Progress Notes (Signed)
MARAH, PARK ([id number]) Visit Report for 08/14/2021 Arrival Information Details Patient Name: Date of Service: Cheryl Scott [name] 08/14/2021 9:15 A M Medical Record Number: [id number] Patient Account Number: [id number] Date of Birth/Sex: Treating RN: [birth year] (51 y.o. Tonita Phoenix, Lauren Primary Care [name]: Shanon Rosser Other Clinician: Referring Kehaulani Fruin: Treating Annalynne Ibanez/Extender: Webb Laws in Treatment: 1 Visit Information History Since Last Visit Added or deleted any medications: No Patient Arrived: Ambulatory Any new allergies or adverse reactions: No Arrival Time: 09:35 Had a fall or experienced change in No Accompanied By: self activities of daily living that may affect Transfer Assistance: None risk of falls: Patient Identification Verified: Yes Signs or symptoms of abuse/neglect since last visito No Secondary Verification Process Completed: Yes Hospitalized since last visit: No Patient Requires Transmission-Based Precautions: No Implantable device outside of the clinic excluding No Patient Has Alerts: No cellular tissue based products placed in the center since last visit: Has Dressing in Place as Prescribed: Yes Pain Present Now: Yes Electronic Signature(s) Signed: 08/14/2021 4:19:33 PM By: Sandre Kitty Entered By: Sandre Kitty on 08/14/2021 09:36:17 -------------------------------------------------------------------------------- Clinic Level of Care Assessment Details Patient Name: Date of Service: Cheryl Scott [name] 08/14/2021 9:15 A M Medical Record Number: [id number] Patient Account Number: [id number] Date of Birth/Sex: Treating RN: Jul 20, 1971 (51 y.o. [name] Primary Care Rosey Eide: Shanon Rosser Other Clinician: Referring Lyrical Sowle: Treating Amanuel Sinkfield/Extender: Webb Laws in Treatment: 1 Clinic Level of Care Assessment Items TOOL 4 Quantity Score []  - 0 Use when only an EandM is  performed on FOLLOW-UP visit ASSESSMENTS - Nursing Assessment / Reassessment X- 1 10 Reassessment of Co-morbidities (includes updates in patient status) X- 1 5 Reassessment of Adherence to Treatment Plan ASSESSMENTS - Wound and Skin A ssessment / Reassessment X - Simple Wound Assessment / Reassessment - one wound 1 5 []  - 0 Complex Wound Assessment / Reassessment - multiple wounds []  - 0 Dermatologic / Skin Assessment (not related to wound area) ASSESSMENTS - Focused Assessment []  - 0 Circumferential Edema Measurements - multi extremities []  - 0 Nutritional Assessment / Counseling / Intervention []  - 0 Lower Extremity Assessment (monofilament, tuning fork, pulses) []  - 0 Peripheral Arterial Disease Assessment (using hand held doppler) ASSESSMENTS - Ostomy and/or Continence Assessment and Care []  - 0 Incontinence Assessment and Management []  - 0 Ostomy Care Assessment and Management (repouching, etc.) PROCESS - Coordination of Care X - Simple Patient / Family Education for ongoing care 1 15 []  - 0 Complex (extensive) Patient / Family Education for ongoing care X- 1 10 Staff obtains Programmer, systems, Records, T Results / Process Orders est []  - 0 Staff telephones HHA, Nursing Homes / Clarify orders / etc []  - 0 Routine Transfer to another Facility (non-emergent condition) []  - 0 Routine Hospital Admission (non-emergent condition) []  - 0 New Admissions / Biomedical engineer / Ordering NPWT Apligraf, etc. , []  - 0 Emergency Hospital Admission (emergent condition) X- 1 10 Simple Discharge Coordination []  - 0 Complex (extensive) Discharge Coordination PROCESS - Special Needs []  - 0 Pediatric / Minor Patient Management []  - 0 Isolation Patient Management []  - 0 Hearing / Language / Visual special needs []  - 0 Assessment of Community assistance (transportation, D/C planning, etc.) []  - 0 Additional assistance / Altered mentation []  - 0 Support Surface(s) Assessment  (bed, cushion, seat, etc.) INTERVENTIONS - Wound Cleansing / Measurement X - Simple Wound Cleansing - one wound 1 5 []  - 0 Complex Wound Cleansing -  multiple wounds X- 1 5 Wound Imaging (photographs - any number of wounds) []  - 0 Wound Tracing (instead of photographs) X- 1 5 Simple Wound Measurement - one wound []  - 0 Complex Wound Measurement - multiple wounds INTERVENTIONS - Wound Dressings X - Small Wound Dressing one or multiple wounds 1 10 []  - 0 Medium Wound Dressing one or multiple wounds []  - 0 Large Wound Dressing one or multiple wounds []  - 0 Application of Medications - topical []  - 0 Application of Medications - injection INTERVENTIONS - Miscellaneous []  - 0 External ear exam []  - 0 Specimen Collection (cultures, biopsies, blood, body fluids, etc.) []  - 0 Specimen(s) / Culture(s) sent or taken to Lab for analysis []  - 0 Patient Transfer (multiple staff / Civil Service fast streamer / Similar devices) []  - 0 Simple Staple / Suture removal (25 or less) []  - 0 Complex Staple / Suture removal (26 or more) []  - 0 Hypo / Hyperglycemic Management (close monitor of Blood Glucose) []  - 0 Ankle / Brachial Index (ABI) - do not check if billed separately X- 1 5 Vital Signs Has the patient been seen at the hospital within the last three years: Yes Total Score: 85 Level Of Care: New/Established - Level 3 Electronic Signature(s) Signed: 08/14/2021 4:46:25 PM By: Baruch Gouty RN, BSN Entered By: Baruch Gouty on 08/14/2021 09:59:32 -------------------------------------------------------------------------------- Lower Extremity Assessment Details Patient Name: Date of Service: Cheryl Scott, Cheryl Scott [name] 08/14/2021 9:15 A M Medical Record Number: [id number] Patient Account Number: [id number] Date of Birth/Sex: Treating RN: Apr 03, 1971 (51 y.o. [name] Primary Care Legacy Lacivita: Shanon Rosser Other Clinician: Referring Benney [place]: Treating Dashae Wilcher/Extender: Webb Laws in Treatment: 1 Electronic Signature(s) Signed: 08/14/2021 4:46:25 PM By: Baruch Gouty RN, BSN Entered By: Baruch Gouty on 08/14/2021 09:44:27 -------------------------------------------------------------------------------- Multi Wound Chart Details Patient Name: Date of Service: Cheryl Scott, Cheryl [name] 08/14/2021 9:15 A M Medical Record Number: [id number] Patient Account Number: [id number] Date of Birth/Sex: Treating RN: Mar 14, 1971 (51 y.o. Tonita Phoenix, Lauren Primary Care Miata Culbreth: Shanon Rosser Other Clinician: Referring Darcelle Herrada: Treating Cordai Rodrigue/Extender: Webb Laws in Treatment: 1 Vital Signs Height(in): 67 Pulse(bpm): 78 Weight(lbs): 278 Blood Pressure(mmHg): 166/96 Body Mass Index(BMI): 44 Temperature(F): 97.8 Respiratory Rate(breaths/min): 18 Photos: [N/A:N/A] Gluteal fold N/A N/A Wound Location: Gradually Appeared N/A N/A Wounding Event: Infection - not elsewhere classified N/A N/A Primary Etiology: Anemia, Sleep Apnea, Hypertension, N/A N/A Comorbid History: Type II Diabetes, Osteoarthritis 07/27/2021 N/A N/A Date Acquired: 1 N/A N/A Weeks of Treatment: Open N/A N/A Wound Status: 10x7.5x0.2 N/A N/A Measurements L x W x D (cm) 58.905 N/A N/A A (cm) : rea 11.781 N/A N/A Volume (cm) : -56.30% N/A N/A % Reduction in Area: -56.20% N/A N/A % Reduction in Volume: Full Thickness Without Exposed N/A N/A Classification: Support Structures Medium N/A N/A Exudate Amount: Serosanguineous N/A N/A Exudate Type: red, brown N/A N/A Exudate Color: Distinct, outline attached N/A N/A Wound Margin: Large (67-100%) N/A N/A Granulation Amount: Red N/A N/A Granulation Quality: Small (1-33%) N/A N/A Necrotic Amount: Fat Layer (Subcutaneous Tissue): Yes N/A N/A Exposed Structures: Fascia: No Tendon: No Muscle: No Joint: No Bone: No Small (1-33%) N/A N/A Epithelialization: Treatment Notes Electronic  Signature(s) Signed: 08/14/2021 3:43:46 PM By: Linton Ham MD Signed: 08/14/2021 4:57:53 PM By: Rhae Hammock RN Entered By: Linton Ham on 08/14/2021 09:59:35 -------------------------------------------------------------------------------- Multi-Disciplinary Care Plan Details Patient Name: Date of Service: Cheryl Scott, Cheryl [name] 08/14/2021 9:15 A M Medical Record Number: [id number] Patient Account Number:  [id number] Date of Birth/Sex: Treating RN: 1970-12-17 (51 y.o. [name] Primary Care Zynasia Burklow: Shanon Rosser Other Clinician: Referring Marni Franzoni: Treating Elaina Cara/Extender: Webb Laws in Treatment: 1 Multidisciplinary Care Plan reviewed with physician Active Inactive Wound/Skin Impairment Nursing Diagnoses: Impaired tissue integrity Goals: Patient/caregiver will verbalize understanding of skin care regimen Date Initiated: 08/07/2021 Target Resolution Date: 09/11/2021 Goal Status: Active Ulcer/skin breakdown will have a volume reduction of 30% by week 4 Date Initiated: 08/07/2021 Target Resolution Date: 09/11/2021 Goal Status: Active Interventions: Assess patient/caregiver ability to obtain necessary supplies Assess patient/caregiver ability to perform ulcer/skin care regimen upon admission and as needed Assess ulceration(s) every visit Provide education on ulcer and skin care Treatment Activities: Topical wound management initiated : 08/07/2021 Notes: Electronic Signature(s) Signed: 08/14/2021 4:46:25 PM By: Baruch Gouty RN, BSN Entered By: Baruch Gouty on 08/14/2021 09:50:59 -------------------------------------------------------------------------------- Pain Assessment Details Patient Name: Date of Service: Cheryl Scott [name] 08/14/2021 9:15 A M Medical Record Number: [id number] Patient Account Number: [id number] Date of Birth/Sex: Treating RN: Sep 06, 1970 (51 y.o. Tonita Phoenix, Lauren Primary Care Kathrina Crosley: Shanon Rosser Other  Clinician: Referring Finola Rosal: Treating Michon Kaczmarek/Extender: Webb Laws in Treatment: 1 Active Problems Location of Pain Severity and Description of Pain Patient Has Paino Yes Site Locations Pain Location: Pain in Ulcers With Dressing Change: Yes Duration of the Pain. Constant / Intermittento Constant Rate the pain. Current Pain Level: 6 Least Pain Level: 3 Character of Pain Describe the Pain: Throbbing Pain Management and Medication Current Pain Management: Medication: Yes Is the Current Pain Management Adequate: Adequate How does your wound impact your activities of daily livingo Sleep: No Bathing: No Appetite: No Relationship With Others: No Bladder Continence: No Emotions: No Bowel Continence: No Work: No Toileting: No Drive: No Dressing: No Hobbies: Yes Electronic Signature(s) Signed: 08/14/2021 4:19:33 PM By: Sandre Kitty Signed: 08/14/2021 4:57:53 PM By: Rhae Hammock RN Entered By: Sandre Kitty on 08/14/2021 09:43:48 -------------------------------------------------------------------------------- Patient/Caregiver Education Details Patient Name: Date of Service: Cheryl Scott [name] 1/18/2023andnbsp9:15 A M Medical Record Number: [id number] Patient Account Number: [id number] Date of Birth/Gender: Treating RN: [birth year] (51 y.o. [name] Primary Care Physician: Shanon Rosser Other Clinician: Referring Physician: Treating Physician/Extender: Webb Laws in Treatment: 1 Education Assessment Education Provided To: Patient Education Topics Provided Infection: Methods: Explain/Verbal Responses: Reinforcements needed, State content correctly Wound/Skin Impairment: Methods: Explain/Verbal Responses: Reinforcements needed, State content correctly Electronic Signature(s) Signed: 08/14/2021 4:46:25 PM By: Baruch Gouty RN, BSN Entered By: Baruch Gouty on 08/14/2021  09:51:26 -------------------------------------------------------------------------------- Wound Assessment Details Patient Name: Date of Service: Cheryl Scott [name] 08/14/2021 9:15 A M Medical Record Number: [id number] Patient Account Number: [id number] Date of Birth/Sex: Treating RN: 04/13/1971 (51 y.o. Tonita Phoenix, Lauren Primary Care Riyan Gavina: Shanon Rosser Other Clinician: Referring Legrande Hao: Treating Allea Kassner/Extender: Webb Laws in Treatment: 1 Wound Status Wound Number: 1 Primary Infection - not elsewhere classified Etiology: Wound Location: Gluteal fold Wound Status: Open Wounding Event: Gradually Appeared Comorbid Anemia, Sleep Apnea, Hypertension, Type II Diabetes, Date Acquired: 07/27/2021 History: Osteoarthritis Weeks Of Treatment: 1 Clustered Wound: No Photos Wound Measurements Length: (cm) 10 Width: (cm) 7.5 Depth: (cm) 0.2 Area: (cm) 58.905 Volume: (cm) 11.781 Wound Description Classification: Full Thickness Without Exposed Support Structu Wound Margin: Distinct, outline attached Exudate Amount: Medium Exudate Type: Serosanguineous Exudate Color: red, brown Foul Odor After Cleansing: Slough/Fibrino % Reduction in Area: -56.3% % Reduction in Volume: -56.2% Epithelialization: Small (1-33%) Tunneling: No Undermining: No res No Yes  Wound Bed Granulation Amount: Large (67-100%) Exposed Structure Granulation Quality: Red Fascia Exposed: No Necrotic Amount: Small (1-33%) Fat Layer (Subcutaneous Tissue) Exposed: Yes Necrotic Quality: Adherent Slough Tendon Exposed: No Muscle Exposed: No Joint Exposed: No Bone Exposed: No Electronic Signature(s) Signed: 08/14/2021 4:46:25 PM By: Baruch Gouty RN, BSN Signed: 08/14/2021 4:57:53 PM By: Rhae Hammock RN Entered By: Baruch Gouty on 08/14/2021 09:48:24 -------------------------------------------------------------------------------- [place] Details Patient Name: Date of  Service: Cheryl Scott, Cheryl [name] 08/14/2021 9:15 A M Medical Record Number: [id number] Patient Account Number: [id number] Date of Birth/Sex: Treating RN: 1971/03/09 (51 y.o. Tonita Phoenix, Lauren Primary Care Norwood Quezada: Shanon Rosser Other Clinician: Referring Montee Tallman: Treating Pierce Barocio/Extender: Webb Laws in Treatment: 1 Vital Signs Time Taken: 09:36 Temperature (F): 97.8 Height (in): 67 Pulse (bpm): 78 Weight (lbs): 278 Respiratory Rate (breaths/min): 18 Body Mass Index (BMI): 43.5 Blood Pressure (mmHg): 166/96 Reference Range: 80 - 120 mg / dl Electronic Signature(s) Signed: 08/14/2021 4:19:33 PM By: Sandre Kitty Entered By: Sandre Kitty on 08/14/2021 09:36:41

## 2021-08-14 NOTE — Progress Notes (Signed)
ADALY, PUDER ([id number]) Visit Report for 08/14/2021 HPI Details Patient Name: Date of Service: Cheryl Scott [name] 08/14/2021 9:15 A M Medical Record Number: [id number] Patient Account Number: [id number] Date of Birth/Sex: Treating RN: Jul 15, 1971 (51 y.o. Benjaman Lobe Primary Care Provider: Shanon Rosser Other Clinician: Referring Provider: Treating Provider/Extender: Webb Laws in Treatment: 1 History of Present Illness HPI Description: 08/07/2020 patient presents today for initial inspection here in the clinic concerning issues that she had following having COVID in May of last year. She tells me that she had issues with glomerulonephritis following and has subsequently been placed on CellCept along with prednisone by her nephrologist. Again with the kidney failure she has had a low GFR and recently the GFR has been around 22 at the last check. This is still low and really has not come back as much as they were hoping to see so far. Nonetheless I do believe that based on what we are seeing currently the patient appears to be having a significant wound over the gluteal and perianal region. It was initially thought that this was likely to be more of a fungal infection. With that being said I do not really see evidence of that at this time it appears to me that this is more of an infectious process but my opinion is it is probably herpes simplex virus. I actually did bring Dr. [name] to see what he had to say as well he therefore was present during a portion of the evaluation and discussed some of the issues with the patient as well today. As a result of being on the prednisone the patient has also developed diabetes over the past year as well unfortunately. She also has hypertension and irritable bowel syndrome the last of this is actually causing her significant issues currently due to the fact that she has the open sores which are excruciatingly painful during  defecation and wiping obviously. Again a week and a half ago she tells me nothing was here and this came on very quickly. She is obviously immune compromised secondary to the CellCept. She is actually been placed on several antifungal medications including Diflucan and nystatin she is also given Bactrim DS. None of this has really made any difference she is also currently telling me that she tries to clean the area as best she can but she is not using any specific dressings. 08/14/2021; this is a patient I briefly saw last week with [name]. We felt that given her immunocompromise state and the rapid tissue destruction here that she likely had severe herpes simplex. Our biopsy confirmed that and did not suggest another diagnosis. She also saw her gynecologist who did a culture for herpes which was positive at this point I do not know which type. She is on Valtrex and she was given 100 days adjusted those for her creatinine clearance and I would like to continue that for another 4 days. She comes in today for areas certainly look better in terms of the surface I do not know that the area of any of these has improved but overall things look a lot less inflamed and angry Electronic Signature(s) Signed: 08/14/2021 3:43:46 PM By: Linton Ham MD Entered By: Linton Ham on 08/14/2021 10:01:32 -------------------------------------------------------------------------------- Physical Exam Details Patient Name: Date of Service: Cheryl Scott [name] 08/14/2021 9:15 A M Medical Record Number: [id number] Patient Account Number: [id number] Date of Birth/Sex: Treating RN: 09-06-1970 (51 y.o. Benjaman Lobe Primary Care  Provider: Shanon Rosser Other Clinician: Referring Provider: Treating Provider/Extender: Webb Laws in Treatment: 1 Constitutional Patient is hypertensive.. Pulse regular and within target range for patient.Marland Kitchen Respirations regular, non-labored and within target  range.. Temperature is normal and within the target range for the patient.Marland Kitchen Appears in no distress. Notes Wound exam; the wounds themselves look about the same as last week although they are a lot cleaner looking and the surrounding tissue does not look as inflamed and angry. Electronic Signature(s) Signed: 08/14/2021 3:43:46 PM By: Linton Ham MD Entered By: Linton Ham on 08/14/2021 10:02:48 -------------------------------------------------------------------------------- Physician Orders Details Patient Name: Date of Service: [name], SA [name] 08/14/2021 9:15 A M Medical Record Number: [id number] Patient Account Number: [id number] Date of Birth/Sex: Treating RN: 09/17/1970 (51 y.o. [name] Primary Care Provider: Shanon Rosser Other Clinician: Referring Provider: Treating Provider/Extender: Webb Laws in Treatment: 1 Verbal / Phone Orders: No Diagnosis Coding Follow-up Appointments ppointment in 1 week. - with Margarita Grizzle Return A Other: - continue taking valtrex Bathing/ Shower/ Hygiene May shower and wash wound with soap and water. Additional Orders / Instructions Follow Nutritious Diet Wound Treatment Wound #1 - Gluteal fold Cleanser: Soap and Water 1 x Per Day/30 Days Discharge Instructions: May shower and wash wound with dial antibacterial soap and water prior to dressing change. Cleanser: Byram Ancillary Kit - 15 Day Supply (DME) (Generic) 1 x Per Day/30 Days Discharge Instructions: Use supplies as instructed; Kit contains: (15) Saline Bullets; (15) 3x3 Gauze; 15 pr Gloves Prim Dressing: KerraCel Ag Gelling Fiber Dressing, 4x5 in (silver alginate) (DME) (Generic) 1 x Per Day/30 Days ary Discharge Instructions: Apply silver alginate to wound bed as instructed Secondary Dressing: ABD Pad, 5x9 (DME) (Generic) 1 x Per Day/30 Days Discharge Instructions: Apply over primary dressing as directed. Secured With: 29M Medipore H Soft Cloth  Surgical T ape, 4 x 10 (in/yd) (DME) (Generic) 1 x Per Day/30 Days Discharge Instructions: Secure with tape as directed. Patient Medications llergies: codeine, penicillin, Seroquel, latex, ACE Inhibitors, Lasix A Notifications Medication Indication Start End herpes simplex 08/14/2021 Valtrex DOSE 1 - oral 1 gram tablet - 1 tablet oral daily for 4 days Electronic Signature(s) Signed: 08/14/2021 3:43:46 PM By: Linton Ham MD Signed: 08/14/2021 4:46:25 PM By: Baruch Gouty RN, BSN Entered By: Baruch Gouty on 08/14/2021 10:12:07 Prescription 08/14/2021 -------------------------------------------------------------------------------- Ike Bene D. Linton Ham MD Patient Name: Provider: [birth year] [id number] Date of Birth: NPI#: [id number] Sex: DEA #: [phone] [id number] Phone #: License #: [place] Patient Address: [facility] [address] [place], [place] [address] [place], [place] [address] [phone] Allergies codeine; penicillin; Seroquel; latex; ACE Inhibitors; Lasix Medication Medication: Route: Strength: Form: Valtrex oral 1 gram tablet Class: ANTIVIRALS, GENERAL Dose: Frequency / Time: Indication: 1 1 tablet oral daily for 4 days herpes simplex Number of Refills: Number of Units: 0 Four (4) Tablet(s) Generic Substitution: Start Date: End Date: Administered at Facility: Substitution Permitted 07/28/7508 No Note to Pharmacy: Hand Signature: Date(s): Electronic Signature(s) Signed: 08/14/2021 3:43:46 PM By: Linton Ham MD Signed: 08/14/2021 4:46:25 PM By: Baruch Gouty RN, BSN Entered By: Baruch Gouty on 08/14/2021 10:12:08 -------------------------------------------------------------------------------- Problem List Details Patient Name: Date of Service: [name], [name] [name] 08/14/2021 9:15 A M Medical Record Number: [id number] Patient Account Number: [id number] Date of Birth/Sex:  Treating RN: 10-22-1970 (51 y.o. Benjaman Lobe Primary Care Provider: Shanon Rosser Other Clinician: Referring Provider: Treating Provider/Extender: Dellia Nims  Redgie Grayer, Scott Weeks in Treatment: 1 Active Problems ICD-10 Encounter Code Description Active Date MDM Diagnosis B00.1 Herpesviral vesicular dermatitis 08/07/2021 No Yes L98.412 Non-pressure chronic ulcer of buttock with fat layer exposed 08/07/2021 No Yes E11.622 Type 2 diabetes mellitus with other skin ulcer 08/07/2021 No Yes I10 Essential (primary) hypertension 08/07/2021 No Yes K58.8 Other irritable bowel syndrome 08/07/2021 No Yes Inactive Problems Resolved Problems Electronic Signature(s) Signed: 08/14/2021 3:43:46 PM By: Linton Ham MD Entered By: Linton Ham on 08/14/2021 09:59:26 -------------------------------------------------------------------------------- Progress Note Details Patient Name: Date of Service: [name], SA [name] 08/14/2021 9:15 A M Medical Record Number: [id number] Patient Account Number: [id number] Date of Birth/Sex: Treating RN: 04-Aug-1970 (51 y.o. Benjaman Lobe Primary Care Provider: Shanon Rosser Other Clinician: Referring Provider: Treating Provider/Extender: Webb Laws in Treatment: 1 Subjective History of Present Illness (HPI) 08/07/2020 patient presents today for initial inspection here in the clinic concerning issues that she had following having COVID in May of last year. She tells me that she had issues with glomerulonephritis following and has subsequently been placed on CellCept along with prednisone by her nephrologist. Again with the kidney failure she has had a low GFR and recently the GFR has been around 22 at the last check. This is still low and really has not come back as much as they were hoping to see so far. Nonetheless I do believe that based on what we are seeing currently the patient appears to be having a significant wound over the  gluteal and perianal region. It was initially thought that this was likely to be more of a fungal infection. With that being said I do not really see evidence of that at this time it appears to me that this is more of an infectious process but my opinion is it is probably herpes simplex virus. I actually did bring Dr. [name] to see what he had to say as well he therefore was present during a portion of the evaluation and discussed some of the issues with the patient as well today. As a result of being on the prednisone the patient has also developed diabetes over the past year as well unfortunately. She also has hypertension and irritable bowel syndrome the last of this is actually causing her significant issues currently due to the fact that she has the open sores which are excruciatingly painful during defecation and wiping obviously. Again a week and a half ago she tells me nothing was here and this came on very quickly. She is obviously immune compromised secondary to the CellCept. She is actually been placed on several antifungal medications including Diflucan and nystatin she is also given Bactrim DS. None of this has really made any difference she is also currently telling me that she tries to clean the area as best she can but she is not using any specific dressings. 08/14/2021; this is a patient I briefly saw last week with [name]. We felt that given her immunocompromise state and the rapid tissue destruction here that she likely had severe herpes simplex. Our biopsy confirmed that and did not suggest another diagnosis. She also saw her gynecologist who did a culture for herpes which was positive at this point I do not know which type. She is on Valtrex and she was given 100 days adjusted those for her creatinine clearance and I would like to continue that for another 4 days. She comes in today for areas certainly look better in terms of the surface  I do not know that the area of any of  these has improved but overall things look a lot less inflamed and angry Objective Constitutional Patient is hypertensive.. Pulse regular and within target range for patient.Marland Kitchen Respirations regular, non-labored and within target range.. Temperature is normal and within the target range for the patient.Marland Kitchen Appears in no distress. Vitals Time Taken: 9:36 AM, Height: 67 in, Weight: 278 lbs, BMI: 43.5, Temperature: 97.8 F, Pulse: 78 bpm, Respiratory Rate: 18 breaths/min, Blood Pressure: 166/96 mmHg. General Notes: Wound exam; the wounds themselves look about the same as last week although they are a lot cleaner looking and the surrounding tissue does not look as inflamed and angry. Integumentary (Hair, Skin) Wound #1 status is Open. Original cause of wound was Gradually Appeared. The date acquired was: 07/27/2021. The wound has been in treatment 1 weeks. The wound is located on the Gluteal fold. The wound measures 10cm length x 7.5cm width x 0.2cm depth; 58.905cm^2 area and 11.781cm^3 volume. There is Fat Layer (Subcutaneous Tissue) exposed. There is no tunneling or undermining noted. There is a medium amount of serosanguineous drainage noted. The wound margin is distinct with the outline attached to the wound base. There is large (67-100%) red granulation within the wound bed. There is a small (1-33%) amount of necrotic tissue within the wound bed including Adherent Slough. 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 - with Margarita Grizzle Other: - continue taking valtrex Bathing/ Shower/ Hygiene: May shower and wash wound with soap and water. Additional Orders / Instructions: Follow Nutritious Diet WOUND #1: - Gluteal fold Wound  Laterality: Cleanser: Soap and Water 1 x Per Day/30 Days Discharge Instructions: May shower and wash wound with dial antibacterial soap and water prior to dressing change. Cleanser: [employer]ram Ancillary Kit - 15 Day Supply (DME) (Generic) 1 x Per Day/30 Days Discharge Instructions: Use supplies as instructed; Kit contains: (15) Saline Bullets; (15) 3x3 Gauze; 15 pr Gloves Prim Dressing: KerraCel Ag Gelling Fiber Dressing, 4x5 in (silver alginate) (DME) (Generic) 1 x Per Day/30 Days ary Discharge Instructions: Apply silver alginate to wound bed as instructed Secondary Dressing: ABD Pad, 5x9 (DME) (Generic) 1 x Per Day/30 Days Discharge Instructions: Apply over primary dressing as directed. Secured With: 71M Medipore H Soft Cloth Surgical T ape, 4 x 10 (in/yd) (DME) (Generic) 1 x Per Day/30 Days Discharge Instructions: Secure with tape as directed. 1. Our pathology/punch biopsy suggested that this was herpes simplex. Did not suggest an alternative pathology 2. Culture done by her gynecologist also showed herpes simplex although I am not exactly sure which type. 3. I will extend her Valtrex for another 4 days 4. Her wounds in the surrounding tissue look better although I think it would be overly optimistic this say that anything is healed Electronic Signature(s) Signed: 08/14/2021 3:43:46 PM By: Linton Ham MD Entered By: Linton Ham on 08/14/2021 10:03:50 -------------------------------------------------------------------------------- SuperBill Details Patient Name: Date of Service: [name], Todd [place] D. 08/14/2021 Medical Record Number: [id number] Patient Account Number: [id number] Date of Birth/Sex: Treating RN: 04-Jan-1971 (51 y.o. Benjaman Lobe Primary Care Provider: Shanon Rosser Other Clinician: Referring Provider: Treating Provider/Extender: Webb Laws in Treatment: 1 Diagnosis Coding ICD-10 Codes Code Description B00.1 Herpesviral vesicular  dermatitis L98.412 Non-pressure chronic ulcer of buttock with fat layer exposed E11.622 Type  2 diabetes mellitus with other skin ulcer I10 Essential (primary) hypertension K58.8 Other irritable bowel syndrome Facility Procedures CPT4 Code: [id number] Description: 9213 - WOUND CARE VISIT-LEV 3 EST PT Modifier: Quantity: 1 Physician Procedures : CPT4 Code Description Modifier [id number] - WC PHYS LEVEL 3 - EST PT ICD-10 Diagnosis Description B00.1 Herpesviral vesicular dermatitis L98.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

## 2021-08-16 ENCOUNTER — Ambulatory Visit: Payer: 59 | Admitting: Nurse Practitioner

## 2021-08-21 ENCOUNTER — Encounter (HOSPITAL_BASED_OUTPATIENT_CLINIC_OR_DEPARTMENT_OTHER): Payer: 59 | Admitting: Physician Assistant

## 2021-08-21 ENCOUNTER — Encounter (HOSPITAL_COMMUNITY)
Admission: RE | Admit: 2021-08-21 | Discharge: 2021-08-21 | Disposition: A | Discharge status: Home / Self Care | Payer: 59 | Source: Ambulatory Visit | Attending: Nephrology | Admitting: Nephrology

## 2021-08-21 ENCOUNTER — Encounter (HOSPITAL_COMMUNITY): Payer: Self-pay

## 2021-08-21 ENCOUNTER — Other Ambulatory Visit: Payer: Self-pay

## 2021-08-21 DIAGNOSIS — N013 Rapidly progressive nephritic syndrome with diffuse mesangial proliferative glomerulonephritis: Secondary | ICD-10-CM | POA: Insufficient documentation

## 2021-08-21 DIAGNOSIS — D631 Anemia in chronic kidney disease: Secondary | ICD-10-CM | POA: Insufficient documentation

## 2021-08-21 DIAGNOSIS — N184 Chronic kidney disease, stage 4 (severe): Secondary | ICD-10-CM | POA: Insufficient documentation

## 2021-08-21 MED ORDER — SODIUM CHLORIDE 0.9 % IV SOLN
Freq: Once | INTRAVENOUS | Status: AC
  Administered 2021-08-21: 09:00:00 250 mL via INTRAVENOUS

## 2021-08-21 MED ORDER — DEXAMETHASONE SODIUM PHOSPHATE 4 MG/ML IJ SOLN
INTRAMUSCULAR | Status: AC
  Administered 2021-08-21: 12:00:00 4 mg
  Filled 2021-08-21: qty 1

## 2021-08-21 MED ORDER — DIPHENHYDRAMINE HCL 50 MG/ML IJ SOLN
INTRAMUSCULAR | Status: AC
  Administered 2021-08-21: 12:00:00 25 mg via INTRAVENOUS
  Filled 2021-08-21: qty 1

## 2021-08-21 MED ORDER — METHYLPREDNISOLONE SODIUM SUCC 125 MG IJ SOLR
125.0000 mg | Freq: Once | INTRAMUSCULAR | Status: AC
  Administered 2021-08-21: 09:00:00 125 mg via INTRAVENOUS
  Filled 2021-08-21: qty 2

## 2021-08-21 MED ORDER — ACETAMINOPHEN 325 MG PO TABS: 975.0000 mg | ORAL_TABLET | Freq: Once | ORAL | Status: DC

## 2021-08-21 MED ORDER — SODIUM CHLORIDE 0.9 % IV SOLN
1000.0000 mg | Freq: Once | INTRAVENOUS | Status: AC
  Administered 2021-08-21: 09:00:00 1000 mg via INTRAVENOUS
  Filled 2021-08-21: qty 100

## 2021-08-21 MED ORDER — ACETAMINOPHEN 325 MG PO TABS
ORAL_TABLET | ORAL | Status: AC
  Administered 2021-08-21: 09:00:00 975 mg
  Filled 2021-08-21: qty 3

## 2021-08-21 MED ORDER — DIPHENHYDRAMINE HCL 50 MG/ML IJ SOLN: 25.0000 mg | Freq: Once | INTRAMUSCULAR | Status: DC

## 2021-08-21 MED ORDER — DEXAMETHASONE SODIUM PHOSPHATE 4 MG/ML IJ SOLN: 4.0000 mg | Freq: Once | INTRAMUSCULAR | Status: DC

## 2021-08-21 MED ORDER — DIPHENHYDRAMINE HCL 25 MG PO CAPS
25.0000 mg | ORAL_CAPSULE | Freq: Once | ORAL | Status: AC
  Administered 2021-08-21: 09:00:00 25 mg via ORAL
  Filled 2021-08-21: qty 1

## 2021-08-21 NOTE — Progress Notes (Addendum)
Cheryl Scott ([id number]) Visit Report for 08/21/2021 Chief Complaint Document Details Patient Name: Date of Service: Cheryl Scott Cheryl D. 08/21/2021 1:45 PM Medical Record Number: [id number] Patient Account Number: [id number] Date of Birth/Sex: Treating RN: 07/24/1971 (51 y.o. Cheryl Scott Primary Care Provider: Shanon Scott Other Clinician: Referring Provider: Treating Provider/Extender: Cheryl Scott in Treatment: 2 Information Obtained from: Patient Chief Complaint Gluteal and perianal ulcers Electronic Signature(s) Signed: 08/21/2021 2:33:40 PM By: Worthy Keeler PA-C Entered By: Worthy Keeler on 08/21/2021 14:33:40 -------------------------------------------------------------------------------- HPI Details Patient Name: Date of Service: Cheryl Scott Cheryl D. 08/21/2021 1:45 PM Medical Record Number: [id number] Patient Account Number: [id number] Date of Birth/Sex: Treating RN: April 19, 1971 (51 y.o. Cheryl Scott Primary Care Provider: Shanon Scott Other Clinician: Referring Provider: Treating Provider/Extender: Cheryl Scott in Treatment: 2 History of Present Illness HPI Description: 08/07/2020 patient presents today for initial inspection here in the clinic concerning issues that she had following having COVID in May of last year. She tells me that she had issues with glomerulonephritis following and has subsequently been placed on CellCept along with prednisone by her nephrologist. Again with the kidney failure she has had a low GFR and recently the GFR has been around 22 at the last check. This is still low and really has not come back as much as they were hoping to see so far. Nonetheless I do believe that based on what we are seeing currently the patient appears to be having a significant wound over the gluteal and perianal region. It was initially thought that this was likely to be more of a fungal infection. With that being  said I do not really see evidence of that at this time it appears to me that this is more of an infectious process but my opinion is it is probably herpes simplex virus. I actually did bring Cheryl Scott to see what he had to say as well he therefore was present during a portion of the evaluation and discussed some of the issues with the patient as well today. As a result of being on the prednisone the patient has also developed diabetes over the past year as well unfortunately. She also has hypertension and irritable bowel syndrome the last of this is actually causing her significant issues currently due to the fact that she has the open sores which are excruciatingly painful during defecation and wiping obviously. Again a week and a half ago she tells me nothing was here and this came on very quickly. She is obviously immune compromised secondary to the CellCept. She is actually been placed on several antifungal medications including Diflucan and nystatin she is also given Bactrim DS. None of this has really made any difference she is also currently telling me that she tries to clean the area as best she can but she is not using any specific dressings. 08/14/2021; this is a patient I briefly saw last week with Cheryl Scott. We felt that given her immunocompromise state and the rapid tissue destruction here that she likely had severe herpes simplex. Our biopsy confirmed that and did not suggest another diagnosis. She also saw her gynecologist who did a culture for herpes which was positive at this point I do not know which type. She is on Valtrex and she was given 100 days adjusted those for her creatinine clearance and I would like to continue that for another 4 days. She comes in today for areas certainly  look better in terms of the surface I do not know that the area of any of these has improved but overall things look a lot less inflamed and angry 08/21/2021 upon evaluation today patient appears to  be doing well with regard to her wound. Fortunately I feel like that this is looking much better compared to where I saw this 2 Scott back. Subsequently she has been on the Valtrex she has 4 more days of that at this point. This was confirmed through both the biopsy that I performed as well as the test done at her OB/GYN's office to be herpes. Nonetheless I do believe that this is making a excellent improvement overall based on what we are seeing. Electronic Signature(s) Signed: 08/21/2021 3:12:37 PM By: Worthy Keeler PA-C Entered By: Worthy Keeler on 08/21/2021 15:12:37 -------------------------------------------------------------------------------- Physical Exam Details Patient Name: Date of Service: Cheryl Scott Cheryl D. 08/21/2021 1:45 PM Medical Record Number: [id number] Patient Account Number: [id number] Date of Birth/Sex: Treating RN: 03-Sep-1970 (51 y.o. Cheryl Scott Primary Care Provider: Shanon Scott Other Clinician: Referring Provider: Treating Provider/Extender: Cheryl Scott in Treatment: 2 Constitutional Well-nourished and well-hydrated in no acute distress. Respiratory normal breathing without difficulty. Psychiatric this patient is able to make decisions and demonstrates good insight into disease process. Alert and Oriented x 3. pleasant and cooperative. Notes Patient's wound does appear to be less angry and irritated compared to what it was previous. I am actually very pleased in that regard. With that being said I do think that the patient is continuing to show signs of improvement and overall I think that she is on the right track. Electronic Signature(s) Signed: 08/21/2021 3:13:05 PM By: Worthy Keeler PA-C Entered By: Worthy Keeler on 08/21/2021 15:13:04 -------------------------------------------------------------------------------- Physician Orders Details Patient Name: Date of Service: Cheryl Battiest, Cheryl Scott Cheryl D. 08/21/2021 1:45 PM Medical  Record Number: [id number] Patient Account Number: [id number] Date of Birth/Sex: Treating RN: [birth year] (51 y.o. 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 - with Margarita Grizzle Return A Bathing/ Shower/ Hygiene May shower and wash wound with soap and water. Additional Orders / Instructions Follow Nutritious Diet Wound Treatment Wound #1 - Gluteal fold Cleanser: Soap and Water 1 x Per Day/30 Days Discharge Instructions: May shower and wash wound with dial antibacterial soap and water prior to dressing change. Secondary Dressing: ABD Pad, 5x9 (Generic) 1 x Per Day/30 Days Discharge Instructions: or gauze. Apply over primary dressing as directed. Secured With: 46M Medipore H Soft Cloth Surgical T ape, 4 x 10 (in/yd) (Generic) 1 x Per Day/30 Days Discharge Instructions: Secure with tape as directed. Electronic Signature(s) Signed: 08/21/2021 3:45:35 PM By: Worthy Keeler PA-C Signed: 08/21/2021 4:46:14 PM By: Baruch Gouty RN, BSN Entered By: Baruch Gouty on 08/21/2021 15:01:52 -------------------------------------------------------------------------------- Problem List Details Patient Name: Date of Service: Cheryl Battiest, Cheryl Scott Cheryl D. 08/21/2021 1:45 PM Medical Record Number: [id number] Patient Account Number: [id number] Date of Birth/Sex: Treating RN: 1970-08-25 (51 y.o. 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 08/07/2021 No Yes L98.412 Non-pressure chronic ulcer of buttock with fat layer exposed 08/07/2021 No Yes E11.622 Type 2 diabetes mellitus with other skin ulcer 08/07/2021 No Yes I10 Essential (primary) hypertension 08/07/2021 No Yes K58.8 Other irritable bowel syndrome 08/07/2021 No Yes Inactive Problems Resolved Problems Electronic Signature(s) Signed: 08/21/2021 2:33:22 PM By: Worthy Keeler PA-C Entered By: Worthy Keeler on 08/21/2021 14:33:22 -------------------------------------------------------------------------------- Progress Note Details Patient Name: Date of Service: Cheryl Scott Cheryl D. 08/21/2021 1:45 PM Medical Record Number: [id number] Patient Account Number: [id number] Date of Birth/Sex: Treating RN: [birth year] (51 y.o. Cheryl Scott Primary Care Provider: Shanon Scott Other Clinician: Referring Provider: Treating Provider/Extender: Cheryl Scott in Treatment: 2 Subjective Chief Complaint Information obtained from Patient Gluteal and perianal ulcers History of Present Illness (HPI) 08/07/2020 patient presents today for initial inspection here in the clinic concerning issues that she had following having COVID in May of last year. She tells me that she had issues with glomerulonephritis following and has subsequently been placed on CellCept along with prednisone by her nephrologist. Again with the kidney failure she has had a low GFR and recently the GFR has been around 22 at the last check. This is still low and really has not come back as much as they were hoping to see so far. Nonetheless I do believe that based on what we are seeing currently the patient appears to be having a significant wound over the gluteal and perianal region. It was initially thought that this was likely to be more of a fungal  infection. With that being said I do not really see evidence of that at this time it appears to me that this is more of an infectious process but my opinion is it is probably herpes simplex virus. I actually did bring Cheryl Scott to see what he had to say as well he therefore was present during a portion of the evaluation and discussed some of the issues with the patient as well today. As a result of being on the prednisone the patient has also developed diabetes over the past year as well unfortunately. She also has hypertension and irritable bowel syndrome the last of this is actually causing her significant issues currently due to the fact that she has the open sores which are excruciatingly painful during defecation and wiping obviously. Again a week and a half ago she tells me nothing was here and this came on very quickly. She is obviously immune compromised secondary to the CellCept. She is actually been placed on several antifungal medications including Diflucan and nystatin she is also given Bactrim DS. None of this has really made any difference she is also currently telling me that she tries to clean the area as best she can but she is not using any specific dressings. 08/14/2021; this is a patient I briefly saw last week with Cheryl Scott. We felt that given her immunocompromise state and the rapid tissue destruction here that she likely had severe herpes simplex. Our biopsy confirmed that and did not suggest another diagnosis. She also saw her gynecologist who did a culture for herpes which was positive at this point I do not know which type. She is on Valtrex and she was given 100 days adjusted those for her creatinine clearance and I would like to continue that for another 4 days. She comes in today for areas certainly look  better in terms of the surface I do not know that the area of any of these has improved but overall things look a lot less inflamed and angry 08/21/2021 upon evaluation  today patient appears to be doing well with regard to her wound. Fortunately I feel like that this is looking much better compared to where I saw this 2 Scott back. Subsequently she has been on the Valtrex she has 4 more days of that at this point. This was confirmed through both the biopsy that I performed as well as the test done at her OB/GYN's office to be herpes. Nonetheless I do believe that this is making a excellent improvement overall based on what we are seeing. Allergies codeine, penicillin, Seroquel, latex, ACE Inhibitors, Lasix, Rituxan (Severity: Severe, Reaction: coughing, itching) Objective Constitutional Well-nourished and well-hydrated in no acute distress. Vitals Time Taken: 2:16 PM, Height: 67 in, Weight: 278 lbs, BMI: 43.5, Temperature: 97.5 F, Pulse: 64 bpm, Respiratory Rate: 18 breaths/min, Blood Pressure: 135/97 mmHg. Respiratory normal breathing without difficulty. Psychiatric this patient is able to make decisions and demonstrates good insight into disease process. Alert and Oriented x 3. pleasant and cooperative. General Notes: Patient's wound does appear to be less angry and irritated compared to what it was previous. I am actually very pleased in that regard. With that being said I do think that the patient is continuing to show signs of improvement and overall I think that she is on the right track. Integumentary (Hair, Skin) Wound #1 status is Open. Original cause of wound was Gradually Appeared. The date acquired was: 07/27/2021. The wound has been in treatment 2 Scott. The wound is located on the Gluteal fold. The wound measures 8.5cm length x 6cm width x 0.2cm depth; 40.055cm^2 area and 8.011cm^3 volume. There is Fat Layer (Subcutaneous Tissue) exposed. There is no tunneling or undermining noted. There is a medium amount of serosanguineous drainage noted. The wound margin is distinct with the outline attached to the wound base. There is large (67-100%) red  granulation within the wound bed. There is no necrotic tissue within the wound bed. 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 - with Glynn Octave Shower/ Hygiene: May shower and wash wound with soap and water. Additional Orders / Instructions: Follow Nutritious Diet WOUND #1: - Gluteal fold Wound Laterality: Cleanser: Soap and Water 1 x Per Day/30 Days Discharge Instructions: May shower and wash wound with dial antibacterial soap and water prior to dressing change. Secondary Dressing: ABD Pad, 5x9 (Generic) 1 x Per Day/30 Days Discharge Instructions: or gauze. Apply over primary dressing as directed. Secured With: 30M Medipore H Soft Cloth Surgical T ape, 4 x 10 (in/yd) (Generic) 1 x Per Day/30 Days Discharge Instructions: Secure with tape as directed. 1. Would recommend currently that we going to continue with the wound care measures as before and the patient is in agreement with plan. She is just using actually right now coconut oil followed by a regular gauze she tells me that the silver alginate burned way too much. 2. She is also going to continue with avoiding sitting for any long periods of time. With that being said the bupropion is I do not think this is a pressure issue in general and whether she sits or not I think she is good to be  movement and off all of her own accord just due to the fact that she has discomfort here that we do not really have to be too aggressive with offloading so to speak. I think it will accomplish this itself. We will see patient back for reevaluation in 1 week here in the clinic. If anything worsens or changes patient will contact our office for additional recommendations. Electronic  Signature(s) Signed: 08/21/2021 3:13:53 PM By: Worthy Keeler PA-C Entered By: Worthy Keeler on 08/21/2021 15:13:53 -------------------------------------------------------------------------------- SuperBill Details Patient Name: Date of Service: Cheryl Battiest, [place] D. 08/21/2021 Medical Record Number: [id number] Patient Account Number: [id number] Date of Birth/Sex: Treating RN: 11-Mar-1971 (51 y.o. 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 E11.622 Type 2 diabetes mellitus with other skin ulcer I10 Essential (primary) hypertension K58.8 Other irritable bowel syndrome Facility Procedures CPT4 Code: [id number] Description: 99213 - WOUND CARE VISIT-LEV 3 EST PT Modifier: Quantity: 1 Physician Procedures : CPT4 Code Description Modifier [id number] - WC PHYS LEVEL 3 - EST PT ICD-10 Diagnosis Description 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

## 2021-08-21 NOTE — Progress Notes (Addendum)
Patient is receiving Rituxan infusion up to 200mg  currently, complains of sudden coughing,very dry , within a few moments complains of itching pressure and scratchy throat. Infusion stopped, Dr. Theador Hawthorne called and orders received. Vital signs stable.  1132 -Decadron 4mg  IV given along with Benadryl 25mg  IV. 1200- Patient verbalizes that she "is feeling much better, throat is no longer feeling pressure and not itching, coughing has stopped". We will continue to monitor vital signs and symptoms. 12:30- Dr. Theador Hawthorne in to see patient. Evaluated, Rituxan medication added to allergy list. Informed the patient that this treatment will be discontinued, he will see her Friday and will adjust her medicine, informed to call Dr. Alexander Mt or go to ER if any other symptoms presents itself. Patient has had total resolution of all signs or symptoms of allergic reaction. Vital signs stable.  13:10- Discharged to home Condition stable.

## 2021-08-27 ENCOUNTER — Encounter (HOSPITAL_BASED_OUTPATIENT_CLINIC_OR_DEPARTMENT_OTHER): Payer: 59 | Admitting: Internal Medicine

## 2021-08-28 ENCOUNTER — Encounter (HOSPITAL_BASED_OUTPATIENT_CLINIC_OR_DEPARTMENT_OTHER): Payer: 59 | Attending: Physician Assistant | Admitting: Physician Assistant

## 2021-08-28 ENCOUNTER — Other Ambulatory Visit: Payer: Self-pay

## 2021-08-28 DIAGNOSIS — Z8616 Personal history of COVID-19: Secondary | ICD-10-CM | POA: Insufficient documentation

## 2021-08-28 DIAGNOSIS — E11622 Type 2 diabetes mellitus with other skin ulcer: Secondary | ICD-10-CM | POA: Insufficient documentation

## 2021-08-28 DIAGNOSIS — N19 Unspecified kidney failure: Secondary | ICD-10-CM | POA: Insufficient documentation

## 2021-08-28 DIAGNOSIS — I10 Essential (primary) hypertension: Secondary | ICD-10-CM | POA: Diagnosis not present

## 2021-08-28 DIAGNOSIS — L98412 Non-pressure chronic ulcer of buttock with fat layer exposed: Secondary | ICD-10-CM | POA: Diagnosis not present

## 2021-08-28 DIAGNOSIS — E119 Type 2 diabetes mellitus without complications: Secondary | ICD-10-CM | POA: Insufficient documentation

## 2021-08-28 DIAGNOSIS — Z8619 Personal history of other infectious and parasitic diseases: Secondary | ICD-10-CM | POA: Diagnosis not present

## 2021-08-28 DIAGNOSIS — K589 Irritable bowel syndrome without diarrhea: Secondary | ICD-10-CM | POA: Diagnosis not present

## 2021-08-28 DIAGNOSIS — B001 Herpesviral vesicular dermatitis: Secondary | ICD-10-CM | POA: Insufficient documentation

## 2021-08-28 NOTE — Progress Notes (Addendum)
Cheryl Scott, Cheryl Scott ([id number]) Visit Report for 08/28/2021 Chief Complaint Document Details Patient Name: Date of Service: Cheryl Scott [name] 08/28/2021 11:00 A M Medical Record Number: [id number] Patient Account Number: [id number] Date of Birth/Sex: Treating RN: 1971/07/23 (51 y.o. [name] Primary Care Provider: Shanon Rosser Other Clinician: Referring Provider: Treating Provider/Extender: Marchelle Gearing in Treatment: 3 Information Obtained from: Patient Chief Complaint Gluteal and perianal ulcers Electronic Signature(s) Signed: 08/28/2021 11:29:44 AM By: Worthy Keeler PA-C Entered By: Worthy Keeler on 08/28/2021 11:29:43 -------------------------------------------------------------------------------- HPI Details Patient Name: Date of Service: Cheryl Scott [name] 08/28/2021 11:00 A M Medical Record Number: [id number] Patient Account Number: [id number] Date of Birth/Sex: Treating RN: Oct 18, 1970 (51 y.o. [name] Primary Care Provider: Shanon Rosser Other Clinician: Referring Provider: Treating Provider/Extender: Edythe Clarity Weeks in Treatment: 3 History of Present Illness HPI Description: 08/07/2020 patient presents today for initial inspection here in the clinic concerning issues that she had following having COVID in May of last year. She tells me that she had issues with glomerulonephritis following and has subsequently been placed on CellCept along with prednisone by her nephrologist. Again with the kidney failure she has had a low GFR and recently the GFR has been around 22 at the last check. This is still low and really has not come back as much as they were hoping to see so far. Nonetheless I do believe that based on what we are seeing currently the patient appears to be having a significant wound over the gluteal and perianal region. It was initially thought that this was likely to be more of a fungal infection. With that being  said I do not really see evidence of that at this time it appears to me that this is more of an infectious process but my opinion is it is probably herpes simplex virus. I actually did bring Dr. [name] to see what he had to say as well he therefore was present during a portion of the evaluation and discussed some of the issues with the patient as well today. As a result of being on the prednisone the patient has also developed diabetes over the past year as well unfortunately. She also has hypertension and irritable bowel syndrome the last of this is actually causing her significant issues currently due to the fact that she has the open sores which are excruciatingly painful during defecation and wiping obviously. Again a week and a half ago she tells me nothing was here and this came on very quickly. She is obviously immune compromised secondary to the CellCept. She is actually been placed on several antifungal medications including Diflucan and nystatin she is also given Bactrim DS. None of this has really made any difference she is also currently telling me that she tries to clean the area as best she can but she is not using any specific dressings. 08/14/2021; this is a patient I briefly saw last week with [name]. We felt that given her immunocompromise state and the rapid tissue destruction here that she likely had severe herpes simplex. Our biopsy confirmed that and did not suggest another diagnosis. She also saw her gynecologist who did a culture for herpes which was positive at this point I do not know which type. She is on Valtrex and she was given 100 days adjusted those for her creatinine clearance and I would like to continue that for another 4 days. She comes in today for  areas certainly look better in terms of the surface I do not know that the area of any of these has improved but overall things look a lot less inflamed and angry 08/21/2021 upon evaluation today patient appears to  be doing well with regard to her wound. Fortunately I feel like that this is looking much better compared to where I saw this 2 weeks back. Subsequently she has been on the Valtrex she has 4 more days of that at this point. This was confirmed through both the biopsy that I performed as well as the test done at her OB/GYN's office to be herpes. Nonetheless I do believe that this is making a excellent improvement overall based on what we are seeing. 08/28/2021 on evaluation today patient appears to be making good progress which is great news. I do not see any signs of active infection locally nor systemically and I think overall she is doing well. The ulcerations do seem to be dramatically improving she is having some discomfort but nothing like she did in the beginning. Electronic Signature(s) Signed: 08/28/2021 1:44:19 PM By: Worthy Keeler PA-C Entered By: Worthy Keeler on 08/28/2021 13:44:19 -------------------------------------------------------------------------------- Physical Exam Details Patient Name: Date of Service: Cheryl Scott [name] 08/28/2021 11:00 A M Medical Record Number: [id number] Patient Account Number: [id number] Date of Birth/Sex: Treating RN: Oct 10, 1970 (50 y.o. [name] Primary Care Provider: Shanon Rosser Other Clinician: Referring Provider: Treating Provider/Extender: Edythe Clarity Weeks in Treatment: 3 Constitutional Well-nourished and well-hydrated in no acute distress. Respiratory normal breathing without difficulty. Psychiatric this patient is able to make decisions and demonstrates good insight into disease process. Alert and Oriented x 3. pleasant and cooperative. Notes Upon inspection patient's wound bed actually showed signs of good granulation and epithelization at this point. Fortunately I do not see any evidence of active infection locally nor systemically which is great news. Overall I think that she is making good progress this is  just can take its time to heal. Electronic Signature(s) Signed: 08/28/2021 1:44:55 PM By: Worthy Keeler PA-C Entered By: Worthy Keeler on 08/28/2021 13:44:55 -------------------------------------------------------------------------------- Physician Orders Details Patient Name: Date of Service: Cheryl Scott [name] 08/28/2021 11:00 A M Medical Record Number: [id number] Patient Account Number: [id number] Date of Birth/Sex: Treating RN: 08/30/1970 (51 y.o. 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 - with Margarita Grizzle Return A Bathing/ Shower/ Hygiene May shower and wash wound with soap and water. Additional Orders / Instructions Follow Nutritious Diet Wound Treatment Wound #1 - Gluteal fold Cleanser: Soap and Water 1 x Per Day/30 Days Discharge Instructions: May shower and wash wound with dial antibacterial soap and water prior to dressing change. Topical: coconut oil 1 x Per Day/30 Days Discharge Instructions: apply coconut oil at home. Prim Dressing: 1 x Per Day/30 Days ary Secondary Dressing: T Non-Adherent Dressing, 3x4 in 1 x Per Day/30 Days elfa Discharge Instructions: Apply over primary dressing as directed. Electronic Signature(s) Signed: 08/28/2021 3:56:32 PM By: Worthy Keeler PA-C Signed: 08/28/2021 5:26:07 PM By: Deon Pilling RN, BSN Entered By: Deon Pilling on 08/28/2021 12:04:31 --------------------------------------------------------------------------------  Problem List Details Patient  Name: Date of Service: Cheryl Scott [name] 08/28/2021 11:00 A M Medical Record Number: [id number] Patient Account Number: [id number] Date of Birth/Sex: Treating RN: 08/15/1970 (51 y.o. 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 08/07/2021 No Yes L98.412 Non-pressure chronic ulcer of buttock with fat layer exposed 08/07/2021 No Yes E11.622 Type 2 diabetes mellitus with other skin ulcer 08/07/2021 No Yes I10 Essential (primary) hypertension 08/07/2021 No Yes K58.8 Other irritable bowel syndrome 08/07/2021 No Yes Inactive Problems Resolved Problems Electronic Signature(s) Signed: 08/28/2021 11:29:35 AM By: Worthy Keeler PA-C Entered By: Worthy Keeler on 08/28/2021 11:29:34 -------------------------------------------------------------------------------- Progress Note Details Patient Name: Date of Service: Cheryl Scott [name] 08/28/2021 11:00 A M Medical Record Number: [id number] Patient Account Number: [id number] Date of Birth/Sex: Treating RN: 20-Aug-1970 (51 y.o. [name] Primary Care Provider: Shanon Rosser Other Clinician: Referring Provider: Treating Provider/Extender: Edythe Clarity Weeks in Treatment: 3 Subjective Chief Complaint Information obtained from Patient Gluteal and perianal ulcers History of Present Illness (HPI) 08/07/2020 patient presents today for initial inspection here in the clinic concerning issues that she had following having COVID in May of last year. She tells me that she had issues with glomerulonephritis following and has subsequently been placed on CellCept along with prednisone by her nephrologist. Again with the kidney failure she has had a low GFR and recently the GFR has been around 22 at the  last check. This is still low and really has not come back as much as they were hoping to see so far. Nonetheless I do believe that based on what we are seeing currently the patient appears to be having a significant wound over the gluteal and perianal region. It was initially thought that this was likely to be more of a fungal infection. With that being said I do not really see evidence of that at this time it appears to me that this is more of an infectious process but my opinion is it is probably herpes simplex virus. I actually did bring Dr. [name] to see what he had to say as well he therefore was present during a portion of the evaluation and discussed some of the issues with the patient as well today. As a result of being on the prednisone the patient has also developed diabetes over the past year as well unfortunately. She also has hypertension and irritable bowel syndrome the last of this is actually causing her significant issues currently due to the fact that she has the open sores which are excruciatingly painful during defecation and wiping obviously. Again a week and a half ago she tells me nothing was here and this came on very quickly. She is obviously immune compromised secondary to the CellCept. She is actually been placed on several antifungal medications including Diflucan and nystatin she is also given Bactrim DS. None of this has really made any difference she is also currently telling me that she tries to clean the area as best she can but she is not using any specific dressings. 08/14/2021; this is a patient I briefly saw last week with [name]. We felt that given her immunocompromise state and the rapid tissue destruction here that she likely had severe herpes simplex. Our biopsy confirmed that and did not suggest another diagnosis. She  also saw her gynecologist who did a culture for herpes which was positive at this point I do not know which type. She is on Valtrex and she  was given 100 days adjusted those for her creatinine clearance and I would like to continue that for another 4 days. She comes in today for areas certainly look better in terms of the surface I do not know that the area of any of these has improved but overall things look a lot less inflamed and angry 08/21/2021 upon evaluation today patient appears to be doing well with regard to her wound. Fortunately I feel like that this is looking much better compared to where I saw this 2 weeks back. Subsequently she has been on the Valtrex she has 4 more days of that at this point. This was confirmed through both the biopsy that I performed as well as the test done at her OB/GYN's office to be herpes. Nonetheless I do believe that this is making a excellent improvement overall based on what we are seeing. 08/28/2021 on evaluation today patient appears to be making good progress which is great news. I do not see any signs of active infection locally nor systemically and I think overall she is doing well. The ulcerations do seem to be dramatically improving she is having some discomfort but nothing like she did in the beginning. Objective Constitutional Well-nourished and well-hydrated in no acute distress. Vitals Time Taken: 11:38 AM, Height: 67 in, Weight: 278 lbs, BMI: 43.5, Temperature: 97.7 F, Pulse: 62 bpm, Respiratory Rate: 18 breaths/min, Blood Pressure: 136/84 mmHg. Respiratory normal breathing without difficulty. Psychiatric this patient is able to make decisions and demonstrates good insight into disease process. Alert and Oriented x 3. pleasant and cooperative. General Notes: Upon inspection patient's wound bed actually showed signs of good granulation and epithelization at this point. Fortunately I do not see any evidence of active infection locally nor systemically which is great news. Overall I think that she is making good progress this is just can take its time to heal. Integumentary (Hair,  Skin) Wound #1 status is Open. Original cause of wound was Gradually Appeared. The date acquired was: 07/27/2021. The wound has been in treatment 3 weeks. The wound is located on the Gluteal fold. The wound measures 7.2cm length x 6.5cm width x 0.1cm depth; 36.757cm^2 area and 3.676cm^3 volume. There is Fat Layer (Subcutaneous Tissue) exposed. There is no tunneling or undermining noted. There is a medium amount of serosanguineous drainage noted. The wound margin is distinct with the outline attached to the wound base. There is large (67-100%) red granulation within the wound bed. There is a small (1-33%) amount of necrotic tissue within the wound bed including Adherent Slough. 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 - with Glynn Octave Shower/ Hygiene: May shower and wash wound with soap and water. Additional Orders / Instructions: Follow Nutritious Diet WOUND #1: - Gluteal fold Wound Laterality: Cleanser: Soap and Water 1 x Per Day/30 Days Discharge Instructions: May shower and wash wound with dial antibacterial soap and water prior to dressing change. Topical: coconut oil 1 x Per Day/30 Days Discharge Instructions: apply coconut oil at home. Prim Dressing: 1 x Per Day/30 Days ary Secondary Dressing: T Non-Adherent Dressing, 3x4 in 1 x Per Day/30 Days elfa Discharge Instructions: Apply  over primary dressing as directed. 1. Would recommend currently that we going continue with the wound care measures as before right now she is using coconut oil followed by the use of a T elfa pad between the gluteal region in order to protect the area is much as possible. Were using a silver alginate but this was causing too much irritation she  has gone away from that. 2. I am also can recommend that we have her continue to monitor for any signs of worsening or infection if she has any changes in symptoms she should let me know soon as possible. We will see patient back for reevaluation in 1 week here in the clinic. If anything worsens or changes patient will contact our office for additional recommendations. Electronic Signature(s) Signed: 08/28/2021 1:45:27 PM By: Worthy Keeler PA-C Entered By: Worthy Keeler on 08/28/2021 13:45:27 -------------------------------------------------------------------------------- SuperBill Details Patient Name: Date of Service: Cheryl Scott, Cheryl View D. 08/28/2021 Medical Record Number: [id number] Patient Account Number: [id number] Date of Birth/Sex: Treating RN: 1971-07-05 (51 y.o. 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 E11.622 Type 2 diabetes mellitus with other skin ulcer I10 Essential (primary) hypertension K58.8 Other irritable bowel syndrome Facility Procedures CPT4 Code: [id number] Description: 99213 - WOUND CARE VISIT-LEV 3 EST PT Modifier: Quantity: 1 Physician Procedures : CPT4 Code Description Modifier [id number] - WC PHYS LEVEL 3 - EST PT ICD-10 Diagnosis Description 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

## 2021-08-29 NOTE — Progress Notes (Signed)
NACHELLE, NEGRETTE ([id number]) Visit Report for 08/28/2021 Arrival Information Details Patient Name: Date of Service: Cheryl Churn [name] 08/28/2021 11:00 A M Medical Record Number: [id number] Patient Account Number: [id number] Date of Birth/Sex: Treating RN: 1971/03/02 (51 y.o. Cheryl Scott Primary Care Cheryl Scott: Shanon Rosser Other Clinician: Referring Neysa Arts: Treating Liylah Najarro/Extender: Marchelle Gearing in Treatment: 3 Visit Information History Since Last Visit Added or deleted any medications: No Patient Arrived: Ambulatory Any new allergies or adverse reactions: No Arrival Time: 11:35 Had a fall or experienced change in No Accompanied By: self activities of daily living that may affect Transfer Assistance: None risk of falls: Patient Identification Verified: Yes Signs or symptoms of abuse/neglect since last visito No Secondary Verification Process Completed: Yes Hospitalized since last visit: No Patient Requires Transmission-Based Precautions: No Implantable device outside of the clinic excluding No Patient Has Alerts: No cellular tissue based products placed in the center since last visit: Has Dressing in Place as Prescribed: Yes Pain Present Now: Yes Electronic Signature(s) Signed: 08/29/2021 1:34:24 PM By: Sandre Kitty Entered By: Sandre Kitty on 08/28/2021 11:38:37 -------------------------------------------------------------------------------- Clinic Level of Care Assessment Details Patient Name: Date of Service: Cheryl Churn [name] 08/28/2021 11:00 A M Medical Record Number: [id number] Patient Account Number: [id number] Date of Birth/Sex: Treating RN: 08/07/1970 (51 y.o. Cheryl Scott Primary Care Domenique Quest: Shanon Rosser Other Clinician: Referring Kirsten Mckone: Treating Lorelai Huyser/Extender: Edythe Clarity Weeks in Treatment: 3 Clinic Level of Care Assessment Items TOOL 4 Quantity Score X- 1 0 Use when only an EandM is performed  on FOLLOW-UP visit ASSESSMENTS - Nursing Assessment / Reassessment X- 1 10 Reassessment of Co-morbidities (includes updates in patient status) X- 1 5 Reassessment of Adherence to Treatment Plan ASSESSMENTS - Wound and Skin A ssessment / Reassessment X - Simple Wound Assessment / Reassessment - one wound 1 5 []  - 0 Complex Wound Assessment / Reassessment - multiple wounds X- 1 10 Dermatologic / Skin Assessment (not related to wound area) ASSESSMENTS - Focused Assessment []  - 0 Circumferential Edema Measurements - multi extremities []  - 0 Nutritional Assessment / Counseling / Intervention []  - 0 Lower Extremity Assessment (monofilament, tuning fork, pulses) []  - 0 Peripheral Arterial Disease Assessment (using hand held doppler) ASSESSMENTS - Ostomy and/or Continence Assessment and Care []  - 0 Incontinence Assessment and Management []  - 0 Ostomy Care Assessment and Management (repouching, etc.) PROCESS - Coordination of Care X - Simple Patient / Family Education for ongoing care 1 15 []  - 0 Complex (extensive) Patient / Family Education for ongoing care X- 1 10 Staff obtains Programmer, systems, Records, T Results / Process Orders est []  - 0 Staff telephones HHA, Nursing Homes / Clarify orders / etc []  - 0 Routine Transfer to another Facility (non-emergent condition) []  - 0 Routine Hospital Admission (non-emergent condition) []  - 0 New Admissions / Biomedical engineer / Ordering NPWT Apligraf, etc. , []  - 0 Emergency Hospital Admission (emergent condition) X- 1 10 Simple Discharge Coordination []  - 0 Complex (extensive) Discharge Coordination PROCESS - Special Needs []  - 0 Pediatric / Minor Patient Management []  - 0 Isolation Patient Management []  - 0 Hearing / Language / Visual special needs []  - 0 Assessment of Community assistance (transportation, D/C planning, etc.) []  - 0 Additional assistance / Altered mentation []  - 0 Support Surface(s) Assessment (bed,  cushion, seat, etc.) INTERVENTIONS - Wound Cleansing / Measurement X - Simple Wound Cleansing - one wound 1 5 []  - 0 Complex Wound  Cleansing - multiple wounds X- 1 5 Wound Imaging (photographs - any number of wounds) []  - 0 Wound Tracing (instead of photographs) X- 1 5 Simple Wound Measurement - one wound []  - 0 Complex Wound Measurement - multiple wounds INTERVENTIONS - Wound Dressings X - Small Wound Dressing one or multiple wounds 1 10 []  - 0 Medium Wound Dressing one or multiple wounds []  - 0 Large Wound Dressing one or multiple wounds []  - 0 Application of Medications - topical []  - 0 Application of Medications - injection INTERVENTIONS - Miscellaneous []  - 0 External ear exam []  - 0 Specimen Collection (cultures, biopsies, blood, body fluids, etc.) []  - 0 Specimen(s) / Culture(s) sent or taken to Lab for analysis []  - 0 Patient Transfer (multiple staff / Civil Service fast streamer / Similar devices) []  - 0 Simple Staple / Suture removal (25 or less) []  - 0 Complex Staple / Suture removal (26 or more) []  - 0 Hypo / Hyperglycemic Management (close monitor of Blood Glucose) []  - 0 Ankle / Brachial Index (ABI) - do not check if billed separately X- 1 5 Vital Signs Has the patient been seen at the hospital within the last three years: Yes Total Score: 95 Level Of Care: New/Established - Level 3 Electronic Signature(s) Signed: 08/28/2021 5:26:07 PM By: Deon Pilling RN, BSN Entered By: Deon Pilling on 08/28/2021 12:05:48 -------------------------------------------------------------------------------- Encounter Discharge Information Details Patient Name: Date of Service: Cheryl Scott, Cheryl [place] D. 08/28/2021 11:00 A M Medical Record Number: [id number] Patient Account Number: [id number] Date of Birth/Sex: Treating RN: 04/17/1971 (51 y.o. Cheryl Scott Primary Care Brinae Woods: Shanon Rosser Other Clinician: Referring Suhas Estis: Treating Jasson Siegmann/Extender: Marchelle Gearing  in Treatment: 3 Encounter Discharge Information Items Discharge Condition: Stable Ambulatory Status: Ambulatory Discharge Destination: Home Transportation: Private Auto Accompanied By: self Schedule Follow-up Appointment: Yes Clinical Summary of Care: Electronic Signature(s) Signed: 08/28/2021 5:26:07 PM By: Deon Pilling RN, BSN Entered By: Deon Pilling on 08/28/2021 12:06:22 -------------------------------------------------------------------------------- Lower Extremity Assessment Details Patient Name: Date of Service: Cheryl Churn [name] 08/28/2021 11:00 A M Medical Record Number: [id number] Patient Account Number: [id number] Date of Birth/Sex: Treating RN: 14-Jan-1971 (51 y.o. Cheryl Scott Primary Care Marie Borowski: Shanon Rosser Other Clinician: Referring Liahna Brickner: Treating Easten Maceachern/Extender: Edythe Clarity Weeks in Treatment: 3 Electronic Signature(s) Signed: 08/28/2021 5:26:07 PM By: Deon Pilling RN, BSN Entered By: Deon Pilling on 08/28/2021 11:54:49 -------------------------------------------------------------------------------- [place] Details Patient Name: Date of Service: Cheryl Scott, Cheryl D. 08/28/2021 11:00 A M Medical Record Number: [id number] Patient Account Number: [id number] Date of Birth/Sex: Treating RN: 1970-12-23 (51 y.o. Cheryl Scott Primary Care Gilda Abboud: Shanon Rosser Other Clinician: Referring Ulyana Pitones: Treating Urho Rio/Extender: Marchelle Gearing in Treatment: 3 Multidisciplinary Care Plan reviewed with physician Active Inactive Wound/Skin Impairment Nursing Diagnoses: Impaired tissue integrity Goals: Patient/caregiver will verbalize understanding of skin care regimen Date Initiated: 08/07/2021 Target Resolution Date: 10/25/2021 Goal Status: Active Ulcer/skin breakdown will have a volume reduction of 30% by week 4 Date Initiated: 08/07/2021 Target Resolution Date: 09/11/2021 Goal Status:  Active Interventions: Assess patient/caregiver ability to obtain necessary supplies Assess patient/caregiver ability to perform ulcer/skin care regimen upon admission and as needed Assess ulceration(s) every visit Provide education on ulcer and skin care Treatment Activities: Topical wound management initiated : 08/07/2021 Notes: Electronic Signature(s) Signed: 08/28/2021 5:26:07 PM By: Deon Pilling RN, BSN Entered By: Deon Pilling on 08/28/2021 11:55:20 -------------------------------------------------------------------------------- Pain Assessment Details Patient Name: Date of Service: Cheryl Scott, Cheryl  [place] D. 08/28/2021 11:00 A M Medical Record Number: [id number] Patient Account Number: [id number] Date of Birth/Sex: Treating RN: [birth year] (51 y.o. [name] Primary Care Ayaana Biondo: Shanon Rosser Other Clinician: Referring Harvest Stanco: Treating Justyce Yeater/Extender: Edythe Clarity Weeks in Treatment: 3 Active Problems Location of Pain Severity and Description of Pain Patient Has Paino Yes Site Locations Rate the pain. Rate the pain. Current Pain Level: 3 Pain Management and Medication Current Pain Management: Electronic Signature(s) Signed: 08/28/2021 5:21:41 PM By: Baruch Gouty RN, BSN Signed: 08/29/2021 1:34:24 PM By: Sandre Kitty Entered By: Sandre Kitty on 08/28/2021 11:39:37 -------------------------------------------------------------------------------- Patient/Caregiver Education Details Patient Name: Date of Service: Cheryl Churn [place]. 2/1/2023andnbsp11:00 [facility] Record Number: [id number] Patient Account Number: [id number] Date of Birth/Gender: Treating RN: Aug 15, 1970 (51 y.o. Cheryl Scott Primary Care Physician: Shanon Rosser Other Clinician: Referring Physician: Treating Physician/Extender: Marchelle Gearing in Treatment: 3 Education Assessment Education Provided To: Patient Education Topics Provided Wound/Skin  Impairment: Handouts: Skin Care Do's and Dont's Methods: Explain/Verbal Responses: Reinforcements needed Electronic Signature(s) Signed: 08/28/2021 5:26:07 PM By: Deon Pilling RN, BSN Entered By: Deon Pilling on 08/28/2021 11:55:53 -------------------------------------------------------------------------------- Wound Assessment Details Patient Name: Date of Service: Cheryl Churn [name] 08/28/2021 11:00 A M Medical Record Number: [id number] Patient Account Number: [id number] Date of Birth/Sex: Treating RN: June 09, 1971 (51 y.o. [name] Primary Care Oluwatimilehin Balfour: Shanon Rosser Other Clinician: Referring Hartley Urton: Treating Prarthana Parlin/Extender: Edythe Clarity Weeks in Treatment: 3 Wound Status Wound Number: 1 Primary Infection - not elsewhere classified Etiology: Wound Location: Gluteal fold Wound Status: Open Wounding Event: Gradually Appeared Comorbid Anemia, Sleep Apnea, Hypertension, Type II Diabetes, Date Acquired: 07/27/2021 History: Osteoarthritis Weeks Of Treatment: 3 Clustered Wound: No Photos Wound Measurements Length: (cm) 7.2 Width: (cm) 6.5 Depth: (cm) 0.1 Area: (cm) 36.757 Volume: (cm) 3.676 % Reduction in Area: 2.5% % Reduction in Volume: 51.2% Epithelialization: Medium (34-66%) Tunneling: No Undermining: No Wound Description Classification: Full Thickness Without Exposed Support Structures Wound Margin: Distinct, outline attached Exudate Amount: Medium Exudate Type: Serosanguineous Exudate Color: red, brown Foul Odor After Cleansing: No Slough/Fibrino Yes Wound Bed Granulation Amount: Large (67-100%) Exposed Structure Granulation Quality: Red Fascia Exposed: No Necrotic Amount: Small (1-33%) Fat Layer (Subcutaneous Tissue) Exposed: Yes Necrotic Quality: Adherent Slough Tendon Exposed: No Muscle Exposed: No Joint Exposed: No Bone Exposed: No Treatment Notes Wound #1 (Gluteal fold) Cleanser Soap and Water Discharge Instruction:  May shower and wash wound with dial antibacterial soap and water prior to dressing change. Peri-Wound Care Topical coconut oil Discharge Instruction: apply coconut oil at home. Primary Dressing Secondary Dressing T Non-Adherent Dressing, 3x4 in elfa Discharge Instruction: Apply over primary dressing as directed. Secured With Compression Wrap Compression Stockings Environmental education officer) Signed: 08/28/2021 5:21:41 PM By: Baruch Gouty RN, BSN Signed: 08/28/2021 5:26:07 PM By: Deon Pilling RN, BSN Entered By: Deon Pilling on 08/28/2021 11:54:12 -------------------------------------------------------------------------------- Vitals Details Patient Name: Date of Service: Cheryl Scott, [place] D. 08/28/2021 11:00 A M Medical Record Number: [id number] Patient Account Number: [id number] Date of Birth/Sex: Treating RN: April 04, 1971 (51 y.o. [name] Primary Care Zoye Chandra: Shanon Rosser Other Clinician: Referring Trystyn Sitts: Treating Sundus Pete/Extender: Edythe Clarity Weeks in Treatment: 3 Vital Signs Time Taken: 11:38 Temperature (F): 97.7 Height (in): 67 Pulse (bpm): 62 Weight (lbs): 278 Respiratory Rate (breaths/min): 18 Body Mass Index (BMI): 43.5 Blood Pressure (mmHg): 136/84 Reference Range: 80 - 120 mg / dl Electronic Signature(s) Signed: 08/29/2021 1:34:24 PM By: Rudean Curt,  [place] Entered By: Sandre Kitty on 08/28/2021 11:39:02

## 2021-09-03 NOTE — Progress Notes (Signed)
Cheryl, Scott ([id number]) Visit Report for 08/21/2021 Allergy List Details Patient Name: Date of Service: Cheryl Scott [name] 08/21/2021 1:45 PM Medical Record Number: [id number] Patient Account Number: [id number] Date of Birth/Sex: Treating RN: January 08, 1971 (51 y.o. Cheryl Scott Primary Care Akaisha Truman: Shanon Rosser Other Clinician: Referring Tonnie Friedel: Treating Wasyl Dornfeld/Extender: Edythe Clarity Weeks in Treatment: 2 Allergies Active Allergies codeine penicillin Seroquel latex ACE Inhibitors Lasix Rituxan Reaction: coughing, itching Severity: Severe Active: 08/21/2021 Allergy Notes Electronic Signature(s) Signed: 09/03/2021 8:14:39 AM By: Sharyn Creamer RN, BSN Entered By: Sharyn Creamer on 08/21/2021 14:35:43 -------------------------------------------------------------------------------- Arrival Information Details Patient Name: Date of Service: Cheryl Scott, SA [name] 08/21/2021 1:45 PM Medical Record Number: [id number] Patient Account Number: [id number] Date of Birth/Sex: Treating RN: [birth year] (51 y.o. Cheryl Scott Primary Care Dyquan Minks: Shanon Rosser Other Clinician: Referring Dajiah Kooi: Treating Darenda Fike/Extender: Marchelle Gearing in Treatment: 2 Visit Information Patient Arrived: Ambulatory Arrival Time: 14:15 Accompanied By: husband Transfer Assistance: None Patient Identification Verified: Yes Secondary Verification Process Completed: Yes Patient Requires Transmission-Based Precautions: No Patient Has Alerts: No History Since Last Visit Added or deleted any medications: Yes Any new allergies or adverse reactions: No Had a fall or experienced change in activities of daily living that may affect risk of falls: No Signs or symptoms of abuse/neglect since last visito No Hospitalized since last visit: No Implantable device outside of the clinic excluding cellular tissue based products placed in the center since last visit: No Has  Dressing in Place as Prescribed: Yes Pain Present Now: Yes Electronic Signature(s) Signed: 09/03/2021 8:14:39 AM By: Sharyn Creamer RN, BSN Entered By: Sharyn Creamer on 08/21/2021 14:28:21 -------------------------------------------------------------------------------- Clinic Level of Care Assessment Details Patient Name: Date of Service: Cheryl Scott, [name] [name] 08/21/2021 1:45 PM Medical Record Number: [id number] Patient Account Number: [id number] Date of Birth/Sex: Treating RN: 1971-01-31 (51 y.o. Cheryl Scott Primary Care Cheryl Scott: Shanon Rosser Other Clinician: Referring Matin Mattioli: Treating Tiearra Colwell/Extender: Edythe Clarity Weeks in Treatment: 2 Clinic Level of Care Assessment Items TOOL 4 Quantity Score []  - 0 Use when only an EandM is performed on FOLLOW-UP visit ASSESSMENTS - Nursing Assessment / Reassessment X- 1 10 Reassessment of Co-morbidities (includes updates in patient status) X- 1 5 Reassessment of Adherence to Treatment Plan ASSESSMENTS - Wound and Skin A ssessment / Reassessment X - Simple Wound Assessment / Reassessment - one wound 1 5 []  - 0 Complex Wound Assessment / Reassessment - multiple wounds []  - 0 Dermatologic / Skin Assessment (not related to wound area) ASSESSMENTS - Focused Assessment []  - 0 Circumferential Edema Measurements - multi extremities []  - 0 Nutritional Assessment / Counseling / Intervention []  - 0 Lower Extremity Assessment (monofilament, tuning fork, pulses) []  - 0 Peripheral Arterial Disease Assessment (using hand held doppler) ASSESSMENTS - Ostomy and/or Continence Assessment and Care []  - 0 Incontinence Assessment and Management []  - 0 Ostomy Care Assessment and Management (repouching, etc.) PROCESS - Coordination of Care X - Simple Patient / Family Education for ongoing care 1 15 []  - 0 Complex (extensive) Patient / Family Education for ongoing care X- 1 10 Staff obtains Programmer, systems, Records, T Results / Process  Orders est []  - 0 Staff telephones HHA, Nursing Homes / Clarify orders / etc []  - 0 Routine Transfer to another Facility (non-emergent condition) []  - 0 Routine Hospital Admission (non-emergent condition) []  - 0 New Admissions / Biomedical engineer / Ordering NPWT Apligraf, etc. , []  - 0 Emergency [facility]  Admission (emergent condition) X- 1 10 Simple Discharge Coordination []  - 0 Complex (extensive) Discharge Coordination PROCESS - Special Needs []  - 0 Pediatric / Minor Patient Management []  - 0 Isolation Patient Management []  - 0 Hearing / Language / Visual special needs []  - 0 Assessment of Community assistance (transportation, D/C planning, etc.) []  - 0 Additional assistance / Altered mentation []  - 0 Support Surface(s) Assessment (bed, cushion, seat, etc.) INTERVENTIONS - Wound Cleansing / Measurement X - Simple Wound Cleansing - one wound 1 5 []  - 0 Complex Wound Cleansing - multiple wounds X- 1 5 Wound Imaging (photographs - any number of wounds) []  - 0 Wound Tracing (instead of photographs) X- 1 5 Simple Wound Measurement - one wound []  - 0 Complex Wound Measurement - multiple wounds INTERVENTIONS - Wound Dressings X - Small Wound Dressing one or multiple wounds 1 10 []  - 0 Medium Wound Dressing one or multiple wounds []  - 0 Large Wound Dressing one or multiple wounds []  - 0 Application of Medications - topical []  - 0 Application of Medications - injection INTERVENTIONS - Miscellaneous []  - 0 External ear exam []  - 0 Specimen Collection (cultures, biopsies, blood, body fluids, etc.) []  - 0 Specimen(s) / Culture(s) sent or taken to Lab for analysis []  - 0 Patient Transfer (multiple staff / Civil Service fast streamer / Similar devices) []  - 0 Simple Staple / Suture removal (25 or less) []  - 0 Complex Staple / Suture removal (26 or more) []  - 0 Hypo / Hyperglycemic Management (close monitor of Blood Glucose) []  - 0 Ankle / Brachial Index (ABI) - do not  check if billed separately X- 1 5 Vital Signs Has the patient been seen at the hospital within the last three years: Yes Total Score: 85 Level Of Care: New/Established - Level 3 Electronic Signature(s) Signed: 08/21/2021 4:46:14 PM By: Baruch Gouty RN, BSN Entered By: Baruch Gouty on 08/21/2021 15:08:50 -------------------------------------------------------------------------------- Encounter Discharge Information Details Patient Name: Date of Service: Cheryl Scott, SA [name] 08/21/2021 1:45 PM Medical Record Number: [id number] Patient Account Number: [id number] Date of Birth/Sex: Treating RN: September 06, 1970 (51 y.o. Cheryl Scott Primary Care Christepher Melchior: Shanon Rosser Other Clinician: Referring Geneviene Tesch: Treating Jayshun Galentine/Extender: Marchelle Gearing in Treatment: 2 Encounter Discharge Information Items Discharge Condition: Stable Ambulatory Status: Ambulatory Discharge Destination: Home Transportation: Private Auto Accompanied By: spouse Schedule Follow-up Appointment: Yes Clinical Summary of Care: Patient Declined Electronic Signature(s) Signed: 08/21/2021 4:46:14 PM By: Baruch Gouty RN, BSN Entered By: Baruch Gouty on 08/21/2021 15:00:06 -------------------------------------------------------------------------------- Lower Extremity Assessment Details Patient Name: Date of Service: Cheryl Scott [name] 08/21/2021 1:45 PM Medical Record Number: [id number] Patient Account Number: [id number] Date of Birth/Sex: Treating RN: 29-Apr-1971 (51 y.o. Cheryl Scott Primary Care Venezia Sargeant: Shanon Rosser Other Clinician: Referring Dorrien Grunder: Treating Dainel Arcidiacono/Extender: Edythe Clarity Weeks in Treatment: 2 Electronic Signature(s) Signed: 08/21/2021 4:46:14 PM By: Baruch Gouty RN, BSN Signed: 08/22/2021 3:23:49 PM By: Sandre Kitty Entered By: Sandre Kitty on 08/21/2021  14:17:04 -------------------------------------------------------------------------------- Multi-Disciplinary Care Plan Details Patient Name: Date of Service: Cheryl Scott, SA [name] 08/21/2021 1:45 PM Medical Record Number: [id number] Patient Account Number: [id number] Date of Birth/Sex: Treating RN: [birth year] (51 y.o. Cheryl Scott Primary Care Zareth Rippetoe: Shanon Rosser Other Clinician: Referring Emeree Mahler: Treating Sheriden Archibeque/Extender: Marchelle Gearing in Treatment: 2 Multidisciplinary Care Plan reviewed with physician Active Inactive Wound/Skin Impairment Nursing Diagnoses: Impaired tissue integrity Goals: Patient/caregiver will verbalize understanding of skin care regimen Date Initiated: 08/07/2021  Target Resolution Date: 09/11/2021 Goal Status: Active Ulcer/skin breakdown will have a volume reduction of 30% by week 4 Date Initiated: 08/07/2021 Target Resolution Date: 09/11/2021 Goal Status: Active Interventions: Assess patient/caregiver ability to obtain necessary supplies Assess patient/caregiver ability to perform ulcer/skin care regimen upon admission and as needed Assess ulceration(s) every visit Provide education on ulcer and skin care Treatment Activities: Topical wound management initiated : 08/07/2021 Notes: Electronic Signature(s) Signed: 08/21/2021 4:46:14 PM By: Baruch Gouty RN, BSN Entered By: Baruch Gouty on 08/21/2021 14:38:55 -------------------------------------------------------------------------------- Pain Assessment Details Patient Name: Date of Service: Cheryl Scott [name] 08/21/2021 1:45 PM Medical Record Number: [id number] Patient Account Number: [id number] Date of Birth/Sex: Treating RN: 02-18-1971 (51 y.o. Cheryl Scott Primary Care Nicolemarie Wooley: Shanon Rosser Other Clinician: Referring Shawnelle Spoerl: Treating Natassia Guthridge/Extender: Edythe Clarity Weeks in Treatment: 2 Active Problems Location of Pain Severity and  Description of Pain Patient Has Paino Yes Site Locations Pain Location: Pain in Ulcers Rate the pain. Current Pain Level: 4 Character of Pain Describe the Pain: Aching, Burning, Heavy Pain Management and Medication Current Pain Management: Medication: Yes Rest: Yes Electronic Signature(s) Signed: 08/21/2021 4:46:14 PM By: Baruch Gouty RN, BSN Signed: 09/03/2021 8:14:39 AM By: Sharyn Creamer RN, BSN Entered By: Sharyn Creamer on 08/21/2021 14:31:42 -------------------------------------------------------------------------------- Patient/Caregiver Education Details Patient Name: Date of Service: Cheryl Scott [place]. 1/25/2023andnbsp1:45 PM Medical Record Number: [id number] Patient Account Number: [id number] Date of Birth/Gender: Treating RN: [birth year] (51 y.o. Cheryl Scott Primary Care Physician: Shanon Rosser Other Clinician: Referring Physician: Treating Physician/Extender: Marchelle Gearing in Treatment: 2 Education Assessment Education Provided To: Patient Education Topics Provided Wound/Skin Impairment: Methods: Explain/Verbal Responses: Reinforcements needed, State content correctly Electronic Signature(s) Signed: 08/21/2021 4:46:14 PM By: Baruch Gouty RN, BSN Entered By: Baruch Gouty on 08/21/2021 14:39:21 -------------------------------------------------------------------------------- Wound Assessment Details Patient Name: Date of Service: Cheryl Scott [name] 08/21/2021 1:45 PM Medical Record Number: [id number] Patient Account Number: [id number] Date of Birth/Sex: Treating RN: 07-29-1970 (51 y.o. Cheryl Scott Primary Care Elliona Doddridge: Shanon Rosser Other Clinician: Referring Ruthanne Mcneish: Treating Fay Swider/Extender: Edythe Clarity Weeks in Treatment: 2 Wound Status Wound Number: 1 Primary Infection - not elsewhere classified Etiology: Wound Location: Gluteal fold Wound Status: Open Wounding Event: Gradually  Appeared Comorbid Anemia, Sleep Apnea, Hypertension, Type II Diabetes, Date Acquired: 07/27/2021 History: Osteoarthritis Weeks Of Treatment: 2 Clustered Wound: No Photos Wound Measurements Length: (cm) 8.5 Width: (cm) 6 Depth: (cm) 0.2 Area: (cm) 40.055 Volume: (cm) 8.011 % Reduction in Area: -6.2% % Reduction in Volume: -6.2% Epithelialization: Small (1-33%) Tunneling: No Undermining: No Wound Description Classification: Full Thickness Without Exposed Support Structures Wound Margin: Distinct, outline attached Exudate Amount: Medium Exudate Type: Serosanguineous Exudate Color: red, brown Wound Bed Granulation Amount: Large (67-100%) Granulation Quality: Red Necrotic Amount: None Present (0%) Foul Odor After Cleansing: No Slough/Fibrino Yes Exposed Structure Fascia Exposed: No Fat Layer (Subcutaneous Tissue) Exposed: Yes Tendon Exposed: No Muscle Exposed: No Joint Exposed: No Bone Exposed: No Electronic Signature(s) Signed: 08/21/2021 4:46:14 PM By: Baruch Gouty RN, BSN Signed: 09/03/2021 8:14:39 AM By: Sharyn Creamer RN, BSN Entered By: Sharyn Creamer on 08/21/2021 14:27:45 -------------------------------------------------------------------------------- [place] Details Patient Name: Date of Service: Cheryl Scott, SA [name] 08/21/2021 1:45 PM Medical Record Number: [id number] Patient Account Number: [id number] Date of Birth/Sex: Treating RN: 19-Dec-1970 (51 y.o. Cheryl Scott Primary Care Rhayne Chatwin: Shanon Rosser Other Clinician: Referring Hasina Kreager: Treating Auther Lyerly/Extender: Edythe Clarity Weeks in Treatment: 2 Vital Signs Time  Taken: 14:16 Temperature (F): 97.5 Height (in): 67 Pulse (bpm): 64 Weight (lbs): 278 Respiratory Rate (breaths/min): 18 Body Mass Index (BMI): 43.5 Blood Pressure (mmHg): 135/97 Reference Range: 80 - 120 mg / dl Electronic Signature(s) Signed: 08/22/2021 3:23:49 PM By: Sandre Kitty Entered By: Sandre Kitty  on 08/21/2021 14:16:34

## 2021-09-04 ENCOUNTER — Ambulatory Visit: Payer: 59 | Admitting: Nurse Practitioner

## 2021-09-04 ENCOUNTER — Other Ambulatory Visit: Payer: Self-pay

## 2021-09-04 ENCOUNTER — Encounter (HOSPITAL_BASED_OUTPATIENT_CLINIC_OR_DEPARTMENT_OTHER): Payer: 59 | Admitting: Physician Assistant

## 2021-09-04 ENCOUNTER — Encounter (HOSPITAL_COMMUNITY): Payer: 59

## 2021-09-04 DIAGNOSIS — E11622 Type 2 diabetes mellitus with other skin ulcer: Secondary | ICD-10-CM | POA: Diagnosis not present

## 2021-09-04 NOTE — Progress Notes (Signed)
WYLLOW, SEIGLER ([id number]) Visit Report for 09/04/2021 Arrival Information Details Patient Name: Date of Service: Cheryl Scott [name] 09/04/2021 7:45 A M Medical Record Number: [id number] Patient Account Number: [id number] Date of Birth/Sex: Treating RN: [birth year] (51 y.o. [name] Primary Care Delitha Elms: Shanon Rosser Other Clinician: Referring Linas Stepter: Treating Soundra Lampley/Extender: Marchelle Gearing in Treatment: 4 Visit Information History Since Last Visit Added or deleted any medications: No Patient Arrived: Ambulatory Any new allergies or adverse reactions: No Arrival Time: 08:09 Had a fall or experienced change in No Transfer Assistance: None activities of daily living that may affect Patient Identification Verified: Yes risk of falls: Secondary Verification Process Completed: Yes Signs or symptoms of abuse/neglect since last visito No Patient Requires Transmission-Based Precautions: No Hospitalized since last visit: No Patient Has Alerts: No Implantable device outside of the clinic excluding No cellular tissue based products placed in the center since last visit: Has Dressing in Place as Prescribed: Yes Pain Present Now: No Electronic Signature(s) Signed: 09/04/2021 4:24:10 PM By: Lorrin Jackson Entered By: Lorrin Jackson on 09/04/2021 08:10:04 -------------------------------------------------------------------------------- Clinic Level of Care Assessment Details Patient Name: Date of Service: Cheryl Scott [name] 09/04/2021 7:45 A M Medical Record Number: [id number] Patient Account Number: [id number] Date of Birth/Sex: Treating RN: Jun 08, 1971 (51 y.o. [name] Primary Care Kiren Mcisaac: Shanon Rosser Other Clinician: Referring Elexius Minar: Treating Armonii Sieh/Extender: Edythe Clarity Weeks in Treatment: 4 Clinic Level of Care Assessment Items TOOL 4 Quantity Score X- 1 0 Use when only an EandM is performed on FOLLOW-UP  visit ASSESSMENTS - Nursing Assessment / Reassessment X- 1 10 Reassessment of Co-morbidities (includes updates in patient status) X- 1 5 Reassessment of Adherence to Treatment Plan ASSESSMENTS - Wound and Skin A ssessment / Reassessment X - Simple Wound Assessment / Reassessment - one wound 1 5 []  - 0 Complex Wound Assessment / Reassessment - multiple wounds []  - 0 Dermatologic / Skin Assessment (not related to wound area) ASSESSMENTS - Focused Assessment []  - 0 Circumferential Edema Measurements - multi extremities []  - 0 Nutritional Assessment / Counseling / Intervention []  - 0 Lower Extremity Assessment (monofilament, tuning fork, pulses) []  - 0 Peripheral Arterial Disease Assessment (using hand held doppler) ASSESSMENTS - Ostomy and/or Continence Assessment and Care []  - 0 Incontinence Assessment and Management []  - 0 Ostomy Care Assessment and Management (repouching, etc.) PROCESS - Coordination of Care []  - 0 Simple Patient / Family Education for ongoing care X- 1 20 Complex (extensive) Patient / Family Education for ongoing care []  - 0 Staff obtains Programmer, systems, Records, T Results / Process Orders est []  - 0 Staff telephones HHA, Nursing Homes / Clarify orders / etc []  - 0 Routine Transfer to another Facility (non-emergent condition) []  - 0 Routine Hospital Admission (non-emergent condition) []  - 0 New Admissions / Biomedical engineer / Ordering NPWT Apligraf, etc. , []  - 0 Emergency Hospital Admission (emergent condition) []  - 0 Simple Discharge Coordination []  - 0 Complex (extensive) Discharge Coordination PROCESS - Special Needs []  - 0 Pediatric / Minor Patient Management []  - 0 Isolation Patient Management []  - 0 Hearing / Language / Visual special needs []  - 0 Assessment of Community assistance (transportation, D/C planning, etc.) []  - 0 Additional assistance / Altered mentation []  - 0 Support Surface(s) Assessment (bed, cushion, seat,  etc.) INTERVENTIONS - Wound Cleansing / Measurement X - Simple Wound Cleansing - one wound 1 5 []  - 0 Complex Wound Cleansing - multiple wounds  X- 1 5 Wound Imaging (photographs - any number of wounds) []  - 0 Wound Tracing (instead of photographs) X- 1 5 Simple Wound Measurement - one wound []  - 0 Complex Wound Measurement - multiple wounds INTERVENTIONS - Wound Dressings []  - 0 Small Wound Dressing one or multiple wounds X- 1 15 Medium Wound Dressing one or multiple wounds []  - 0 Large Wound Dressing one or multiple wounds []  - 0 Application of Medications - topical []  - 0 Application of Medications - injection INTERVENTIONS - Miscellaneous []  - 0 External ear exam []  - 0 Specimen Collection (cultures, biopsies, blood, body fluids, etc.) []  - 0 Specimen(s) / Culture(s) sent or taken to Lab for analysis []  - 0 Patient Transfer (multiple staff / Civil Service fast streamer / Similar devices) []  - 0 Simple Staple / Suture removal (25 or less) []  - 0 Complex Staple / Suture removal (26 or more) []  - 0 Hypo / Hyperglycemic Management (close monitor of Blood Glucose) []  - 0 Ankle / Brachial Index (ABI) - do not check if billed separately X- 1 5 Vital Signs Has the patient been seen at the hospital within the last three years: Yes Total Score: 75 Level Of Care: New/Established - Level 2 Electronic Signature(s) Signed: 09/04/2021 4:24:10 PM By: Lorrin Jackson Entered By: Lorrin Jackson on 09/04/2021 08:38:59 -------------------------------------------------------------------------------- Encounter Discharge Information Details Patient Name: Date of Service: [name], SA [name] 09/04/2021 7:45 A M Medical Record Number: [id number] Patient Account Number: [id number] Date of Birth/Sex: Treating RN: [birth year] (51 y.o. [name] Primary Care Jemal Miskell: Shanon Rosser Other Clinician: Referring Donna Snooks: Treating Daiana Vitiello/Extender: Marchelle Gearing in Treatment:  4 Encounter Discharge Information Items Discharge Condition: Stable Ambulatory Status: Ambulatory Discharge Destination: Home Transportation: Private Auto Schedule Follow-up Appointment: Yes Clinical Summary of Care: Provided on 09/04/2021 Form Type Recipient Paper Patient Patient Electronic Signature(s) Signed: 09/04/2021 4:24:10 PM By: Lorrin Jackson Entered By: Lorrin Jackson on 09/04/2021 08:45:06 -------------------------------------------------------------------------------- Lower Extremity Assessment Details Patient Name: Date of Service: Cheryl Scott [place] D. 09/04/2021 7:45 A M Medical Record Number: [id number] Patient Account Number: [id number] Date of Birth/Sex: Treating RN: 17-Jan-1971 (51 y.o. [name] Primary Care Keeven Matty: Shanon Rosser Other Clinician: Referring Laqueena Hinchey: Treating Alazae Crymes/Extender: Edythe Clarity Weeks in Treatment: 4 Electronic Signature(s) Signed: 09/04/2021 4:24:10 PM By: Fara Chute By: Lorrin Jackson on 09/04/2021 08:10:56 -------------------------------------------------------------------------------- Multi-Disciplinary Care Plan Details Patient Name: Date of Service: [name], SA [place] D. 09/04/2021 7:45 A M Medical Record Number: [id number] Patient Account Number: [id number] Date of Birth/Sex: Treating RN: 1971/02/04 (51 y.o. [name] Primary Care Shloimy Michalski: Shanon Rosser Other Clinician: Referring Walid Haig: Treating Keishla Oyer/Extender: Marchelle Gearing in Treatment: [place] reviewed with physician Active Inactive Wound/Skin Impairment Nursing Diagnoses: Impaired tissue integrity Goals: Patient/caregiver will verbalize understanding of skin care regimen Date Initiated: 08/07/2021 Target Resolution Date: 10/25/2021 Goal Status: Active Ulcer/skin breakdown will have a volume reduction of 30% by week 4 Date Initiated: 08/07/2021 Target Resolution Date: 09/11/2021 Goal  Status: Active Interventions: Assess patient/caregiver ability to obtain necessary supplies Assess patient/caregiver ability to perform ulcer/skin care regimen upon admission and as needed Assess ulceration(s) every visit Provide education on ulcer and skin care Treatment Activities: Topical wound management initiated : 08/07/2021 Notes: Electronic Signature(s) Signed: 09/04/2021 4:24:10 PM By: Lorrin Jackson Entered By: Lorrin Jackson on 09/04/2021 08:11:25 -------------------------------------------------------------------------------- Pain Assessment Details Patient Name: Date of Service: Cheryl Scott [name] 09/04/2021 7:45 A M Medical  Record Number: [id number] Patient Account Number: [id number] Date of Birth/Sex: Treating RN: 02/03/1971 (51 y.o. [name] Primary Care Blondine Hottel: Shanon Rosser Other Clinician: Referring Drako Maese: Treating Jaquelynn Wanamaker/Extender: Edythe Clarity Weeks in Treatment: 4 Active Problems Location of Pain Severity and Description of Pain Patient Has Paino No Site Locations Pain Management and Medication Current Pain Management: Electronic Signature(s) Signed: 09/04/2021 4:24:10 PM By: Lorrin Jackson Entered By: Lorrin Jackson on 09/04/2021 08:10:48 -------------------------------------------------------------------------------- Patient/Caregiver Education Details Patient Name: Date of Service: Cheryl Scott [name] 2/8/2023andnbsp7:45 A M Medical Record Number: [id number] Patient Account Number: [id number] Date of Birth/Gender: Treating RN: 04-Jun-1971 (51 y.o. [name] Primary Care Physician: Shanon Rosser Other Clinician: Referring Physician: Treating Physician/Extender: Marchelle Gearing in Treatment: 4 Education Assessment Education Provided To: Patient Education Topics Provided Wound/Skin Impairment: Methods: Explain/Verbal, Printed Responses: State content correctly Electronic Signature(s) Signed:  09/04/2021 4:24:10 PM By: Lorrin Jackson Entered By: Lorrin Jackson on 09/04/2021 08:11:39 -------------------------------------------------------------------------------- Wound Assessment Details Patient Name: Date of Service: Cheryl Scott [place] D. 09/04/2021 7:45 A M Medical Record Number: [id number] Patient Account Number: [id number] Date of Birth/Sex: Treating RN: August 06, 1970 (51 y.o. [name] Primary Care Markesha Hannig: Shanon Rosser Other Clinician: Referring Zebbie Ace: Treating Anely Spiewak/Extender: Edythe Clarity Weeks in Treatment: 4 Wound Status Wound Number: 1 Primary Infection - not elsewhere classified Etiology: Wound Location: Gluteal fold Wound Status: Open Wounding Event: Gradually Appeared Comorbid Anemia, Sleep Apnea, Hypertension, Type II Diabetes, Date Acquired: 07/27/2021 History: Osteoarthritis Weeks Of Treatment: 4 Clustered Wound: No Photos Wound Measurements Length: (cm) 6.1 Width: (cm) 4 Depth: (cm) 0.1 Area: (cm) 19.164 Volume: (cm) 1.916 % Reduction in Area: 49.2% % Reduction in Volume: 74.6% Epithelialization: Medium (34-66%) Tunneling: No Undermining: No Wound Description Classification: Full Thickness Without Exposed Support Structures Wound Margin: Distinct, outline attached Exudate Amount: Medium Exudate Type: Serosanguineous Exudate Color: red, brown Foul Odor After Cleansing: No Slough/Fibrino Yes Wound Bed Granulation Amount: Large (67-100%) Exposed Structure Granulation Quality: Red Fascia Exposed: No Necrotic Amount: Small (1-33%) Fat Layer (Subcutaneous Tissue) Exposed: Yes Necrotic Quality: Adherent Slough Tendon Exposed: No Muscle Exposed: No Joint Exposed: No Bone Exposed: No Treatment Notes Wound #1 (Gluteal fold) Cleanser Soap and Water Discharge Instruction: May shower and wash wound with dial antibacterial soap and water prior to dressing change. Peri-Wound Care Topical Coconut Oil Discharge  Instruction: apply coconut oil at home. Primary Dressing Secondary Dressing T Non-Adherent Dressing, 3x4 in elfa Discharge Instruction: Apply over primary dressing as directed. Secured With Compression Wrap Compression Stockings Environmental education officer) Signed: 09/04/2021 4:24:10 PM By: Lorrin Jackson Entered By: Lorrin Jackson on 09/04/2021 08:15:49 -------------------------------------------------------------------------------- Vitals Details Patient Name: Date of Service: [name], SA [name] 09/04/2021 7:45 A M Medical Record Number: [id number] Patient Account Number: [id number] Date of Birth/Sex: Treating RN: [birth year] (51 y.o. [name] Primary Care Hutson Luft: Shanon Rosser Other Clinician: Referring Emberleigh Reily: Treating Marylou Wages/Extender: Edythe Clarity Weeks in Treatment: 4 Vital Signs Time Taken: 08:10 Temperature (F): 98.1 Height (in): 67 Pulse (bpm): 69 Weight (lbs): 278 Respiratory Rate (breaths/min): 18 Body Mass Index (BMI): 43.5 Blood Pressure (mmHg): 180/83 Capillary Blood Glucose (mg/dl): 109 Reference Range: 80 - 120 mg / dl Electronic Signature(s) Signed: 09/04/2021 4:24:10 PM By: Lorrin Jackson Entered By: Lorrin Jackson on 09/04/2021 08:10:39

## 2021-09-04 NOTE — Progress Notes (Addendum)
Cheryl Scott ([id number]) Visit Report for 09/04/2021 Chief Complaint Document Details Patient Name: Date of Service: Cheryl Churn [place] D. 09/04/2021 7:45 A M Medical Record Number: [id number] Patient Account Number: [id number] Date of Birth/Sex: Treating RN: 03-Nov-1970 (51 y.o. Cheryl Scott Primary Care Provider: Shanon Scott Other Clinician: Referring Provider: Treating Provider/Extender: Cheryl Scott in Treatment: 4 Information Obtained from: Patient Chief Complaint Gluteal and perianal ulcers Electronic Signature(s) Signed: 09/04/2021 8:22:33 AM By: Worthy Keeler PA-C Entered By: Worthy Scott on 09/04/2021 08:22:33 -------------------------------------------------------------------------------- HPI Details Patient Name: Date of Service: Cheryl Churn [name] 09/04/2021 7:45 A M Medical Record Number: [id number] Patient Account Number: [id number] Date of Birth/Sex: Treating RN: 28-Dec-1970 (51 y.o. Cheryl Scott Primary Care Provider: Shanon Scott Other Clinician: Referring Provider: Treating Provider/Extender: Cheryl Scott in Treatment: 4 History of Present Illness HPI Description: 08/07/2020 patient presents today for initial inspection here in the clinic concerning issues that she had following having COVID in May of last year. She tells me that she had issues with glomerulonephritis following and has subsequently been placed on CellCept along with prednisone by her nephrologist. Again with the kidney failure she has had a low GFR and recently the GFR has been around 22 at the last check. This is still low and really has not come back as much as they were hoping to see so far. Nonetheless I do believe that based on what we are seeing currently the patient appears to be having a significant wound over the gluteal and perianal region. It was initially thought that this was likely to be more of a fungal infection. With that being said I  do not really see evidence of that at this time it appears to me that this is more of an infectious process but my opinion is it is probably herpes simplex virus. I actually did bring Dr. Quentin Scott to see what he had to say as well he therefore was present during a portion of the evaluation and discussed some of the issues with the patient as well today. As a result of being on the prednisone the patient has also developed diabetes over the past year as well unfortunately. She also has hypertension and irritable bowel syndrome the last of this is actually causing her significant issues currently due to the fact that she has the open sores which are excruciatingly painful during defecation and wiping obviously. Again a week and a half ago she tells me nothing was here and this came on very quickly. She is obviously immune compromised secondary to the CellCept. She is actually been placed on several antifungal medications including Diflucan and nystatin she is also given Bactrim DS. None of this has really made any difference she is also currently telling me that she tries to clean the area as best she can but she is not using any specific dressings. 08/14/2021; this is a patient I briefly saw last week with Cheryl Scott. We felt that given her immunocompromise state and the rapid tissue destruction here that she likely had severe herpes simplex. Our biopsy confirmed that and did not suggest another diagnosis. She also saw her gynecologist who did a culture for herpes which was positive at this point I do not know which type. She is on Valtrex and she was given 100 days adjusted those for her creatinine clearance and I would like to continue that for another 4 days. She comes in today for  areas certainly look better in terms of the surface I do not know that the area of any of these has improved but overall things look a lot less inflamed and angry 08/21/2021 upon evaluation today patient appears to be doing  well with regard to her wound. Fortunately I feel like that this is looking much better compared to where I saw this 2 Scott back. Subsequently she has been on the Valtrex she has 4 more days of that at this point. This was confirmed through both the biopsy that I performed as well as the test done at her OB/GYN's office to be herpes. Nonetheless I do believe that this is making a excellent improvement overall based on what we are seeing. 08/28/2021 on evaluation today patient appears to be making good progress which is great news. I do not see any signs of active infection locally nor systemically and I think overall she is doing well. The ulcerations do seem to be dramatically improving she is having some discomfort but nothing like she did in the beginning. 09/04/2021 upon evaluation today patient appears to be doing well with regard to her wound in the gluteal region left and right. The right side appears to almost be completely healed based on what I am seeing and I am very pleased in that regard. The left side is where the larger of the 2 areas were and this still is doing awesome. With that being said I still feel like the patient is making great progress here. Electronic Signature(s) Signed: 09/04/2021 8:41:19 AM By: Worthy Keeler PA-C Entered By: Worthy Scott on 09/04/2021 08:41:18 -------------------------------------------------------------------------------- Physical Exam Details Patient Name: Date of Service: Cheryl Churn [name] 09/04/2021 7:45 A M Medical Record Number: [id number] Patient Account Number: [id number] Date of Birth/Sex: Treating RN: [birth year] (51 y.o. Cheryl Scott Primary Care Provider: Shanon Scott Other Clinician: Referring Provider: Treating Provider/Extender: Cheryl Scott in Treatment: 4 Constitutional Well-nourished and well-hydrated in no acute distress. Respiratory normal breathing without difficulty. Psychiatric this patient is  able to make decisions and demonstrates good insight into disease process. Alert and Oriented x 3. pleasant and cooperative. Notes Patient's wound showed a lot of new epithelial areas which is great news and overall I am extremely pleased with where we stand today. I do not see any signs of active infection which is great news. Electronic Signature(s) Signed: 09/04/2021 8:41:47 AM By: Worthy Keeler PA-C Entered By: Worthy Scott on 09/04/2021 08:41:47 -------------------------------------------------------------------------------- Physician Orders Details Patient Name: Date of Service: [name], SA [name] 09/04/2021 7:45 A M Medical Record Number: [id number] Patient Account Number: [id number] Date of Birth/Sex: Treating RN: [birth year] (51 y.o. 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 - with Margarita Grizzle (Room 2) Return A Bathing/ Shower/ Hygiene May shower and wash wound with soap and water. Additional Orders / Instructions Follow Nutritious Diet Wound Treatment Wound #1 - Gluteal fold Cleanser: Soap and Water 1 x Per Day/30 Days Discharge Instructions: May shower and wash wound with dial antibacterial soap and water prior to dressing change. Topical: Coconut Oil 1 x Per  Day/30 Days Discharge Instructions: apply coconut oil at home. Prim Dressing: 1 x Per Day/30 Days ary Secondary Dressing: T Non-Adherent Dressing, 3x4 in 1 x Per Day/30 Days elfa Discharge Instructions: Apply over primary dressing as  directed. Electronic Signature(s) Signed: 09/04/2021 4:24:10 PM By: Lorrin Jackson Signed: 09/04/2021 5:11:08 PM By: Worthy Keeler PA-C Entered By: Lorrin Jackson on 09/04/2021 08:40:29 -------------------------------------------------------------------------------- Problem List Details Patient Name: Date of Service: [name], SA [employer] D. 09/04/2021 7:45 A M Medical Record Number: [id number] Patient Account Number: [id number] Date of Birth/Sex: Treating RN: 09/28/1970 (51 y.o. 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 08/07/2021 No Yes L98.412 Non-pressure chronic ulcer of buttock with fat layer exposed 08/07/2021 No Yes E11.622 Type 2 diabetes mellitus with other skin ulcer 08/07/2021 No Yes I10 Essential (primary) hypertension 08/07/2021 No Yes K58.8 Other irritable bowel syndrome 08/07/2021 No Yes Inactive Problems Resolved Problems Electronic Signature(s) Signed: 09/04/2021 8:16:47 AM By: Worthy Keeler PA-C Entered By: Worthy Scott on 09/04/2021 08:16:47 -------------------------------------------------------------------------------- Progress Note Details Patient Name: Date of Service: Cheryl Churn [name] 09/04/2021 7:45 A M Medical Record Number: [id number] Patient Account Number: [id number] Date of Birth/Sex: Treating RN: 13-Mar-1971 (51 y.o. Cheryl Scott Primary Care Provider: Shanon Scott Other Clinician: Referring Provider: Treating Provider/Extender: Cheryl Scott in Treatment: 4 Subjective Chief Complaint Information obtained from Patient Gluteal and perianal ulcers History of Present Illness (HPI) 08/07/2020 patient presents today for initial inspection here in the clinic concerning issues that she had  following having COVID in May of last year. She tells me that she had issues with glomerulonephritis following and has subsequently been placed on CellCept along with prednisone by her nephrologist. Again with the kidney failure she has had a low GFR and recently the GFR has been around 22 at the last check. This is still low and really has not come back as much as they were hoping to see so far. Nonetheless I do believe that based on what we are seeing currently the patient appears to be having a significant wound over the gluteal and perianal region. It was initially thought that this was likely to be more of a fungal infection. With that being said I do not really see evidence of that at this time it appears to me that this is more of an infectious process but my opinion is it is probably herpes simplex virus. I actually did bring Dr. Quentin Scott to see what he had to say as well he therefore was present during a portion of the evaluation and discussed some of the issues with the patient as well today. As a result of being on the prednisone the patient has also developed diabetes over the past year as well unfortunately. She also has hypertension and irritable bowel syndrome the last of this is actually causing her significant issues currently due to the fact that she has the open sores which are excruciatingly painful during defecation and wiping obviously. Again a week and a half ago she tells me nothing was here and this came on very quickly. She is obviously immune compromised secondary to the CellCept. She is actually been placed on several antifungal medications including Diflucan and nystatin she is also given Bactrim DS. None of this has really made any difference she is also currently telling  me that she tries to clean the area as best she can but she is not using any specific dressings. 08/14/2021; this is a patient I briefly saw last week with Cheryl Scott. We felt that given her  immunocompromise state and the rapid tissue destruction here that she likely had severe herpes simplex. Our biopsy confirmed that and did not suggest another diagnosis. She also saw her gynecologist who did a culture for herpes which was positive at this point I do not know which type. She is on Valtrex and she was given 100 days adjusted those for her creatinine clearance and I would like to continue that for another 4 days. She comes in today for areas certainly look better in terms of the surface I do not know that the area of any of these has improved but overall things look a lot less inflamed and angry 08/21/2021 upon evaluation today patient appears to be doing well with regard to her wound. Fortunately I feel like that this is looking much better compared to where I saw this 2 Scott back. Subsequently she has been on the Valtrex she has 4 more days of that at this point. This was confirmed through both the biopsy that I performed as well as the test done at her OB/GYN's office to be herpes. Nonetheless I do believe that this is making a excellent improvement overall based on what we are seeing. 08/28/2021 on evaluation today patient appears to be making good progress which is great news. I do not see any signs of active infection locally nor systemically and I think overall she is doing well. The ulcerations do seem to be dramatically improving she is having some discomfort but nothing like she did in the beginning. 09/04/2021 upon evaluation today patient appears to be doing well with regard to her wound in the gluteal region left and right. The right side appears to almost be completely healed based on what I am seeing and I am very pleased in that regard. The left side is where the larger of the 2 areas were and this still is doing awesome. With that being said I still feel like the patient is making great progress here. Objective Constitutional Well-nourished and well-hydrated in no acute  distress. Vitals Time Taken: 8:10 AM, Height: 67 in, Weight: 278 lbs, BMI: 43.5, Temperature: 98.1 F, Pulse: 69 bpm, Respiratory Rate: 18 breaths/min, Blood Pressure: 180/83 mmHg, Capillary Blood Glucose: 109 mg/dl. Respiratory normal breathing without difficulty. Psychiatric this patient is able to make decisions and demonstrates good insight into disease process. Alert and Oriented x 3. pleasant and cooperative. General Notes: Patient's wound showed a lot of new epithelial areas which is great news and overall I am extremely pleased with where we stand today. I do not see any signs of active infection which is great news. Integumentary (Hair, Skin) Wound #1 status is Open. Original cause of wound was Gradually Appeared. The date acquired was: 07/27/2021. The wound has been in treatment 4 Scott. The wound is located on the Gluteal fold. The wound measures 6.1cm length x 4cm width x 0.1cm depth; 19.164cm^2 area and 1.916cm^3 volume. There is Fat Layer (Subcutaneous Tissue) exposed. There is no tunneling or undermining noted. There is a medium amount of serosanguineous drainage noted. The wound margin is distinct with the outline attached to the wound base. There is large (67-100%) red granulation within the wound bed. There is a small (1-33%) amount of necrotic tissue within the wound bed including Adherent Slough.  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 - with Margarita Grizzle (Room 2) Bathing/ Shower/ Hygiene: May shower and wash wound with soap and water. Additional Orders / Instructions: Follow Nutritious Diet WOUND #1: - Gluteal fold Wound Laterality: Cleanser: Soap and Water 1 x Per Day/30 Days Discharge Instructions: May shower and  wash wound with dial antibacterial soap and water prior to dressing change. Topical: Coconut Oil 1 x Per Day/30 Days Discharge Instructions: apply coconut oil at home. Prim Dressing: 1 x Per Day/30 Days ary Secondary Dressing: T Non-Adherent Dressing, 3x4 in 1 x Per Day/30 Days elfa Discharge Instructions: Apply over primary dressing as directed. 1. Would recommend that we go ahead and actually continue with the wound care measures as before and the patient is in agreement with plan. She has been using a T pad in between the gluteal region and using coconut oil on either side of the gluteus in order to pad this region. In general that has been doing elfa extremely well for her and I think that she is progressing very well. 2. Also can recommend she continue to monitor for any signs of infection such as increased pain or drainage that is unusual for her right now this seems to be healing quite nicely. We will see patient back for reevaluation in 1 week here in the clinic. If anything worsens or changes patient will contact our office for additional recommendations. Electronic Signature(s) Signed: 09/04/2021 8:42:11 AM By: Worthy Keeler PA-C Entered By: Worthy Scott on 09/04/2021 08:42:10 -------------------------------------------------------------------------------- SuperBill Details Patient Name: Date of Service: [name], [place] D. 09/04/2021 Medical Record Number: [id number] Patient Account Number: [id number] Date of Birth/Sex: Treating RN: May 16, 1971 (51 y.o. 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 E11.622 Type 2 diabetes mellitus with other skin ulcer I10 Essential (primary) hypertension K58.8  Other irritable bowel syndrome Facility Procedures CPT4 Code: [id number] Description: [id number] - WOUND CARE VISIT-LEV 2 EST PT Modifier: Quantity: 1 Physician Procedures : CPT4 Code Description Modifier [id number] - WC PHYS LEVEL 3 - EST PT ICD-10 Diagnosis Description 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

## 2021-09-05 ENCOUNTER — Encounter: Payer: Self-pay | Admitting: Physician Assistant

## 2021-09-11 ENCOUNTER — Encounter (HOSPITAL_BASED_OUTPATIENT_CLINIC_OR_DEPARTMENT_OTHER): Payer: 59 | Admitting: Physician Assistant

## 2021-09-18 ENCOUNTER — Other Ambulatory Visit: Payer: Self-pay

## 2021-09-18 ENCOUNTER — Encounter (HOSPITAL_BASED_OUTPATIENT_CLINIC_OR_DEPARTMENT_OTHER): Payer: 59 | Admitting: Physician Assistant

## 2021-09-18 DIAGNOSIS — E11622 Type 2 diabetes mellitus with other skin ulcer: Secondary | ICD-10-CM | POA: Diagnosis not present

## 2021-09-18 NOTE — Progress Notes (Signed)
KRISTIANNE, ALBIN ([id number]) Visit Report for 09/18/2021 Chief Complaint Document Details Patient Name: Date of Service: Cheryl Scott [name] 09/18/2021 8:15 A M Medical Record Number: [id number] Patient Account Number: [id number] Date of Birth/Sex: Treating RN: 11-20-1970 (51 y.o. F) Primary Care Provider: Shanon Rosser Other Clinician: Referring Provider: Treating Provider/Extender: Edythe Clarity Weeks in Treatment: 6 Information Obtained from: Patient Chief Complaint Gluteal and perianal ulcers Electronic Signature(s) Signed: 09/18/2021 8:37:16 AM By: Worthy Keeler PA-C Entered By: Worthy Keeler on 09/18/2021 08:37:16 -------------------------------------------------------------------------------- HPI Details Patient Name: Date of Service: [name], SA [name] 09/18/2021 8:15 A M Medical Record Number: [id number] Patient Account Number: [id number] Date of Birth/Sex: Treating RN: 10/10/1970 (51 y.o. F) Primary Care Provider: Shanon Rosser Other Clinician: Referring Provider: Treating Provider/Extender: Edythe Clarity Weeks in Treatment: 6 History of Present Illness HPI Description: 08/07/2020 patient presents today for initial inspection here in the clinic concerning issues that she had following having COVID in May of last year. She tells me that she had issues with glomerulonephritis following and has subsequently been placed on CellCept along with prednisone by her nephrologist. Again with the kidney failure she has had a low GFR and recently the GFR has been around 22 at the last check. This is still low and really has not come back as much as they were hoping to see so far. Nonetheless I do believe that based on what we are seeing currently the patient appears to be having a significant wound over the gluteal and perianal region. It was initially thought that this was likely to be more of a fungal infection. With that being said I do not really see  evidence of that at this time it appears to me that this is more of an infectious process but my opinion is it is probably herpes simplex virus. I actually did bring Dr. [name] to see what he had to say as well he therefore was present during a portion of the evaluation and discussed some of the issues with the patient as well today. As a result of being on the prednisone the patient has also developed diabetes over the past year as well unfortunately. She also has hypertension and irritable bowel syndrome the last of this is actually causing her significant issues currently due to the fact that she has the open sores which are excruciatingly painful during defecation and wiping obviously. Again a week and a half ago she tells me nothing was here and this came on very quickly. She is obviously immune compromised secondary to the CellCept. She is actually been placed on several antifungal medications including Diflucan and nystatin she is also given Bactrim DS. None of this has really made any difference she is also currently telling me that she tries to clean the area as best she can but she is not using any specific dressings. 08/14/2021; this is a patient I briefly saw last week with [name]. We felt that given her immunocompromise state and the rapid tissue destruction here that she likely had severe herpes simplex. Our biopsy confirmed that and did not suggest another diagnosis. She also saw her gynecologist who did a culture for herpes which was positive at this point I do not know which type. She is on Valtrex and she was given 100 days adjusted those for her creatinine clearance and I would like to continue that for another 4 days. She comes in today for areas certainly look better  in terms of the surface I do not know that the area of any of these has improved but overall things look a lot less inflamed and angry 08/21/2021 upon evaluation today patient appears to be doing well with regard  to her wound. Fortunately I feel like that this is looking much better compared to where I saw this 2 weeks back. Subsequently she has been on the Valtrex she has 4 more days of that at this point. This was confirmed through both the biopsy that I performed as well as the test done at her OB/GYN's office to be herpes. Nonetheless I do believe that this is making a excellent improvement overall based on what we are seeing. 08/28/2021 on evaluation today patient appears to be making good progress which is great news. I do not see any signs of active infection locally nor systemically and I think overall she is doing well. The ulcerations do seem to be dramatically improving she is having some discomfort but nothing like she did in the beginning. 09/04/2021 upon evaluation today patient appears to be doing well with regard to her wound in the gluteal region left and right. The right side appears to almost be completely healed based on what I am seeing and I am very pleased in that regard. The left side is where the larger of the 2 areas were and this still is doing awesome. With that being said I still feel like the patient is making great progress here. 09/17/2021 upon evaluation today patient appears to be doing well with regard to her wound. In fact the right side is completely healed. The left side though not completely healed does appear to be doing much better which is great news. Fortunately I do not see any evidence of active infection locally nor systemically which is also excellent news. I think she is definitely on the right track towards complete resolution. Her pain is also significantly improved. Electronic Signature(s) Signed: 09/18/2021 8:49:39 AM By: Worthy Keeler PA-C Entered By: Worthy Keeler on 09/18/2021 08:49:39 -------------------------------------------------------------------------------- Physical Exam Details Patient Name: Date of Service: [name], [place] [name] 09/18/2021 8:15 A  M Medical Record Number: [id number] Patient Account Number: [id number] Date of Birth/Sex: Treating RN: 1970-08-28 (51 y.o. F) Primary Care Provider: Shanon Rosser Other Clinician: Referring Provider: Treating Provider/Extender: Edythe Clarity Weeks in Treatment: 6 Constitutional Well-nourished and well-hydrated in no acute distress. Respiratory normal breathing without difficulty. Psychiatric this patient is able to make decisions and demonstrates good insight into disease process. Alert and Oriented x 3. pleasant and cooperative. Notes Upon inspection patient's wound bed actually showed signs of good granulation and epithelization at this point. Fortunately I do not see any evidence of active infection locally or systemically which is great news and overall I am extremely happy in that regard. No fevers, chills, nausea, vomiting, or diarrhea. Electronic Signature(s) Signed: 09/18/2021 8:49:57 AM By: Worthy Keeler PA-C Entered By: Worthy Keeler on 09/18/2021 08:49:57 -------------------------------------------------------------------------------- Physician Orders Details Patient Name: Date of Service: [name], SA [name] 09/18/2021 8:15 A M Medical Record Number: [id number] Patient Account Number: [id number] Date of Birth/Sex: Treating RN: 1971/07/04 (51 y.o. 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 Margarita Grizzle and Tammi Klippel, RN Room 8 Return A Bathing/ Shower/ Hygiene May shower and wash wound  with soap and water. Additional Orders / Instructions Follow Nutritious Diet Wound Treatment Wound #1 - Gluteal fold Cleanser: Soap and Water 1 x Per Day/30 Days Discharge Instructions: May shower and wash wound with dial antibacterial soap and water prior to dressing change. Topical: Coconut Oil 1 x Per Day/30 Days Discharge Instructions: apply coconut oil at home. Prim Dressing: 1 x Per Day/30 Days ary Secondary Dressing: T Non-Adherent Dressing, 3x4 in 1 x Per Day/30 Days elfa Discharge Instructions: Apply over primary dressing as directed. Electronic Signature(s) Signed: 09/18/2021 4:57:32 PM By: Worthy Keeler PA-C Signed: 09/18/2021 6:06:00 PM By: Deon Pilling RN, BSN Entered By: Deon Pilling on 09/18/2021 08:29:31 -------------------------------------------------------------------------------- Problem List Details Patient Name: Date of Service: [name], SA [name] 09/18/2021 8:15 A M Medical Record Number: [id number] Patient Account Number: [id number] Date of Birth/Sex: Treating RN: October 01, 1970 (51 y.o. Debby Bud Primary Care Provider: Shanon Rosser Other Clinician: Referring Provider: Treating Provider/Extender: Edythe Clarity Weeks in Treatment: 6 Active Problems ICD-10 Encounter Code Description Active Date MDM Diagnosis B00.1 Herpesviral vesicular dermatitis 08/07/2021 No Yes L98.412 Non-pressure chronic ulcer of buttock with fat layer exposed 08/07/2021 No Yes E11.622 Type 2 diabetes mellitus with other skin ulcer 08/07/2021 No Yes I10 Essential (primary) hypertension 08/07/2021 No Yes K58.8 Other irritable bowel syndrome 08/07/2021 No Yes Inactive Problems Resolved Problems Electronic Signature(s) Signed: 09/18/2021 8:37:10 AM By: Worthy Keeler PA-C Previous Signature: 09/18/2021 8:32:36 AM Version By: Worthy Keeler PA-C Entered By: Worthy Keeler on 09/18/2021  08:37:10 -------------------------------------------------------------------------------- Progress Note Details Patient Name: Date of Service: Cheryl Scott [name] 09/18/2021 8:15 A M Medical Record Number: [id number] Patient Account Number: [id number] Date of Birth/Sex: Treating RN: [birth year] (51 y.o. F) Primary Care Provider: Shanon Rosser Other Clinician: Referring Provider: Treating Provider/Extender: Edythe Clarity Weeks in Treatment: 6 Subjective Chief Complaint Information obtained from Patient Gluteal and perianal ulcers History of Present Illness (HPI) 08/07/2020 patient presents today for initial inspection here in the clinic concerning issues that she had following having COVID in May of last year. She tells me that she had issues with glomerulonephritis following and has subsequently been placed on CellCept along with prednisone by her nephrologist. Again with the kidney failure she has had a low GFR and recently the GFR has been around 22 at the last check. This is still low and really has not come back as much as they were hoping to see so far. Nonetheless I do believe that based on what we are seeing currently the patient appears to be having a significant wound over the gluteal and perianal region. It was initially thought that this was likely to be more of a fungal infection. With that being said I do not really see evidence of that at this time it appears to me that this is more of an infectious process but my opinion is it is probably herpes simplex virus. I actually did bring Dr. [name] to see what he had to say as well he therefore was present during a portion of the evaluation and discussed some of the issues with the patient as well today. As a result of being on the prednisone the patient has also developed diabetes over the past year as well unfortunately. She also  has hypertension and irritable bowel syndrome the last of this is actually causing her  significant issues currently due to the fact that she has the open sores which are excruciatingly painful during defecation and wiping obviously. Again a week and a half ago she tells me nothing was here and this came on very quickly. She is obviously immune compromised secondary to the CellCept. She is actually been placed on several antifungal medications including Diflucan and nystatin she is also given Bactrim DS. None of this has really made any difference she is also currently telling me that she tries to clean the area as best she can but she is not using any specific dressings. 08/14/2021; this is a patient I briefly saw last week with [name]. We felt that given her immunocompromise state and the rapid tissue destruction here that she likely had severe herpes simplex. Our biopsy confirmed that and did not suggest another diagnosis. She also saw her gynecologist who did a culture for herpes which was positive at this point I do not know which type. She is on Valtrex and she was given 100 days adjusted those for her creatinine clearance and I would like to continue that for another 4 days. She comes in today for areas certainly look better in terms of the surface I do not know that the area of any of these has improved but overall things look a lot less inflamed and angry 08/21/2021 upon evaluation today patient appears to be doing well with regard to her wound. Fortunately I feel like that this is looking much better compared to where I saw this 2 weeks back. Subsequently she has been on the Valtrex she has 4 more days of that at this point. This was confirmed through both the biopsy that I performed as well as the test done at her OB/GYN's office to be herpes. Nonetheless I do believe that this is making a excellent improvement overall based on what we are seeing. 08/28/2021 on evaluation today patient appears to be making good progress which is great news. I do not see any signs of active  infection locally nor systemically and I think overall she is doing well. The ulcerations do seem to be dramatically improving she is having some discomfort but nothing like she did in the beginning. 09/04/2021 upon evaluation today patient appears to be doing well with regard to her wound in the gluteal region left and right. The right side appears to almost be completely healed based on what I am seeing and I am very pleased in that regard. The left side is where the larger of the 2 areas were and this still is doing awesome. With that being said I still feel like the patient is making great progress here. 09/17/2021 upon evaluation today patient appears to be doing well with regard to her wound. In fact the right side is completely healed. The left side though not completely healed does appear to be doing much better which is great news. Fortunately I do not see any evidence of active infection locally nor systemically which is also excellent news. I think she is definitely on the right track towards complete resolution. Her pain is also significantly improved. Objective Constitutional Well-nourished and well-hydrated in no acute distress. Vitals Time Taken: 8:19 AM, Height: 67 in, Weight: 278 lbs, BMI: 43.5, Temperature: 98.1 F, Pulse: 61 bpm, Respiratory Rate: 20 breaths/min, Blood Pressure: 161/87 mmHg, Capillary Blood Glucose: 101 mg/dl. Respiratory normal breathing without difficulty. Psychiatric this patient  is able to make decisions and demonstrates good insight into disease process. Alert and Oriented x 3. pleasant and cooperative. General Notes: Upon inspection patient's wound bed actually showed signs of good granulation and epithelization at this point. Fortunately I do not see any evidence of active infection locally or systemically which is great news and overall I am extremely happy in that regard. No fevers, chills, nausea, vomiting, or diarrhea. Integumentary (Hair,  Skin) Wound #1 status is Open. Original cause of wound was Gradually Appeared. The date acquired was: 07/27/2021. The wound has been in treatment 6 weeks. The wound is located on the Gluteal fold. The wound measures 4.8cm length x 1cm width x 0.1cm depth; 3.77cm^2 area and 0.377cm^3 volume. There is Fat Layer (Subcutaneous Tissue) exposed. There is no tunneling or undermining noted. There is a medium amount of serosanguineous drainage noted. The wound margin is distinct with the outline attached to the wound base. There is large (67-100%) red granulation within the wound bed. There is no necrotic tissue within the wound bed. 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 Margarita Grizzle and Tammi Klippel, RN Room 8 Bathing/ Shower/ Hygiene: May shower and wash wound with soap and water. Additional Orders / Instructions: Follow Nutritious Diet WOUND #1: - Gluteal fold Wound Laterality: Cleanser: Soap and Water 1 x Per Day/30 Days Discharge Instructions: May shower and wash wound with dial antibacterial soap and water prior to dressing change. Topical: Coconut Oil 1 x Per Day/30 Days Discharge Instructions: apply coconut oil at home. Prim Dressing: 1 x Per Day/30 Days ary Secondary Dressing: T Non-Adherent Dressing, 3x4 in 1 x Per Day/30 Days elfa Discharge Instructions: Apply over primary dressing as directed. 1. I am going to suggest that we going continue with the wound care measures as before and the patient is in agreement the plan. This includes the use of the coconut oil which does seem to be doing well for her. 2. I am also can recommend that we have the patient continue with the T pad in order to protect as well. elfa 3. I am also can recommend  that the patient continue to monitor for any signs of worsening or infection. If anything changes she should let me know soon as possible. We will see patient back for reevaluation in 2 weeks here in the clinic. If anything worsens or changes patient will contact our office for additional recommendations. Electronic Signature(s) Signed: 09/18/2021 8:50:41 AM By: Worthy Keeler PA-C Entered By: Worthy Keeler on 09/18/2021 08:50:41 -------------------------------------------------------------------------------- SuperBill Details Patient Name: Date of Service: [name], [place] D. 09/18/2021 Medical Record Number: [id number] Patient Account Number: [id number] Date of Birth/Sex: Treating RN: [birth year] (52 y.o. 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 E11.622 Type 2 diabetes mellitus with other skin ulcer I10 Essential (primary) hypertension K58.8 Other irritable bowel syndrome Facility Procedures CPT4 Code: [id number] Description: 99213 - WOUND CARE VISIT-LEV 3 EST PT Modifier: Quantity: 1 Physician Procedures : CPT4 Code Description Modifier [id number] - WC PHYS LEVEL 3 - EST PT ICD-10 Diagnosis Description B00.1 Herpesviral vesicular dermatitis L98.412 Non-pressure chronic ulcer of  buttock with fat layer exposed E11.622 Type 2 diabetes mellitus with other  skin ulcer I10 Essential (primary) hypertension Quantity: 1 Electronic Signature(s) Signed: 09/18/2021 8:50:54 AM By: Worthy Keeler PA-C Entered By: Worthy Keeler on 09/18/2021 08:50:54

## 2021-09-18 NOTE — Progress Notes (Signed)
SABRIE, MORITZ ([id number]) Visit Report for 09/18/2021 Arrival Information Details Patient Name: Date of Service: Kelli Churn [name] 09/18/2021 8:15 A M Medical Record Number: [id number] Patient Account Number: [id number] Date of Birth/Sex: Treating RN: [birth year] (51 y.o. Helene Shoe, Meta.Reding Primary Care Lindzie Boxx: Shanon Rosser Other Clinician: Referring Gali Spinney: Treating Sheana Bir/Extender: Edythe Clarity Weeks in Treatment: 6 Visit Information History Since Last Visit Added or deleted any medications: No Patient Arrived: Ambulatory Any new allergies or adverse reactions: No Arrival Time: 08:16 Had a fall or experienced change in No Accompanied By: Husband activities of daily living that may affect Transfer Assistance: None risk of falls: Patient Identification Verified: Yes Signs or symptoms of abuse/neglect since last visito No Secondary Verification Process Completed: Yes Hospitalized since last visit: No Patient Requires Transmission-Based Precautions: No Implantable device outside of the clinic excluding No Patient Has Alerts: No cellular tissue based products placed in the center since last visit: Has Dressing in Place as Prescribed: Yes Pain Present Now: No Electronic Signature(s) Signed: 09/18/2021 6:06:00 PM By: Deon Pilling RN, BSN Entered By: Deon Pilling on 09/18/2021 08:18:31 -------------------------------------------------------------------------------- Clinic Level of Care Assessment Details Patient Name: Date of Service: [name], [place] [name] 09/18/2021 8:15 A M Medical Record Number: [id number] Patient Account Number: [id number] Date of Birth/Sex: Treating RN: [birth year] (51 y.o. Debby Bud Primary Care Shaniya Tashiro: Shanon Rosser Other Clinician: Referring Brunilda Eble: Treating Labron Bloodgood/Extender: Edythe Clarity Weeks in Treatment: 6 Clinic Level of Care Assessment Items TOOL 4 Quantity Score X- 1 0 Use when only an EandM is  performed on FOLLOW-UP visit ASSESSMENTS - Nursing Assessment / Reassessment X- 1 10 Reassessment of Co-morbidities (includes updates in patient status) X- 1 5 Reassessment of Adherence to Treatment Plan ASSESSMENTS - Wound and Skin A ssessment / Reassessment X - Simple Wound Assessment / Reassessment - one wound 1 5 []  - 0 Complex Wound Assessment / Reassessment - multiple wounds []  - 0 Dermatologic / Skin Assessment (not related to wound area) ASSESSMENTS - Focused Assessment []  - 0 Circumferential Edema Measurements - multi extremities []  - 0 Nutritional Assessment / Counseling / Intervention []  - 0 Lower Extremity Assessment (monofilament, tuning fork, pulses) []  - 0 Peripheral Arterial Disease Assessment (using hand held doppler) ASSESSMENTS - Ostomy and/or Continence Assessment and Care []  - 0 Incontinence Assessment and Management []  - 0 Ostomy Care Assessment and Management (repouching, etc.) PROCESS - Coordination of Care X - Simple Patient / Family Education for ongoing care 1 15 []  - 0 Complex (extensive) Patient / Family Education for ongoing care X- 1 10 Staff obtains Programmer, systems, Records, T Results / Process Orders est []  - 0 Staff telephones HHA, Nursing Homes / Clarify orders / etc []  - 0 Routine Transfer to another Facility (non-emergent condition) []  - 0 Routine Hospital Admission (non-emergent condition) []  - 0 New Admissions / Biomedical engineer / Ordering NPWT Apligraf, etc. , []  - 0 Emergency Hospital Admission (emergent condition) X- 1 10 Simple Discharge Coordination []  - 0 Complex (extensive) Discharge Coordination PROCESS - Special Needs []  - 0 Pediatric / Minor Patient Management []  - 0 Isolation Patient Management []  - 0 Hearing / Language / Visual special needs []  - 0 Assessment of Community assistance (transportation, D/C planning, etc.) []  - 0 Additional assistance / Altered mentation []  - 0 Support Surface(s) Assessment  (bed, cushion, seat, etc.) INTERVENTIONS - Wound Cleansing / Measurement X - Simple Wound Cleansing - one wound 1 5 []  - 0  Complex Wound Cleansing - multiple wounds X- 1 5 Wound Imaging (photographs - any number of wounds) []  - 0 Wound Tracing (instead of photographs) X- 1 5 Simple Wound Measurement - one wound []  - 0 Complex Wound Measurement - multiple wounds INTERVENTIONS - Wound Dressings X - Small Wound Dressing one or multiple wounds 1 10 []  - 0 Medium Wound Dressing one or multiple wounds []  - 0 Large Wound Dressing one or multiple wounds []  - 0 Application of Medications - topical []  - 0 Application of Medications - injection INTERVENTIONS - Miscellaneous []  - 0 External ear exam []  - 0 Specimen Collection (cultures, biopsies, blood, body fluids, etc.) []  - 0 Specimen(s) / Culture(s) sent or taken to Lab for analysis []  - 0 Patient Transfer (multiple staff / Civil Service fast streamer / Similar devices) []  - 0 Simple Staple / Suture removal (25 or less) []  - 0 Complex Staple / Suture removal (26 or more) []  - 0 Hypo / Hyperglycemic Management (close monitor of Blood Glucose) []  - 0 Ankle / Brachial Index (ABI) - do not check if billed separately X- 1 5 Vital Signs Has the patient been seen at the hospital within the last three years: Yes Total Score: 85 Level Of Care: New/Established - Level 3 Electronic Signature(s) Signed: 09/18/2021 6:06:00 PM By: Deon Pilling RN, BSN Entered By: Deon Pilling on 09/18/2021 08:44:25 -------------------------------------------------------------------------------- Encounter Discharge Information Details Patient Name: Date of Service: [name], [place] [name] 09/18/2021 8:15 A M Medical Record Number: [id number] Patient Account Number: [id number] Date of Birth/Sex: Treating RN: 1971-04-19 (51 y.o. Debby Bud Primary Care Pleasant Bensinger: Shanon Rosser Other Clinician: Referring Mabrey Howland: Treating Jeweldean Drohan/Extender: Marchelle Gearing in Treatment: 6 Encounter Discharge Information Items Discharge Condition: Stable Ambulatory Status: Ambulatory Discharge Destination: Home Transportation: Private Auto Accompanied By: husband Schedule Follow-up Appointment: Yes Clinical Summary of Care: Electronic Signature(s) Signed: 09/18/2021 6:06:00 PM By: Deon Pilling RN, BSN Entered By: Deon Pilling on 09/18/2021 08:45:47 -------------------------------------------------------------------------------- Lower Extremity Assessment Details Patient Name: Date of Service: Kelli Churn [name] 09/18/2021 8:15 A M Medical Record Number: [id number] Patient Account Number: [id number] Date of Birth/Sex: Treating RN: November 04, 1970 (51 y.o. Debby Bud Primary Care Hussain Maimone: Shanon Rosser Other Clinician: Referring Dajion Bickford: Treating Benjamin Casanas/Extender: Edythe Clarity Weeks in Treatment: 6 Electronic Signature(s) Signed: 09/18/2021 6:06:00 PM By: Deon Pilling RN, BSN Entered By: Deon Pilling on 09/18/2021 08:18:50 -------------------------------------------------------------------------------- Multi-Disciplinary Care Plan Details Patient Name: Date of Service: [name], [place] [name] 09/18/2021 8:15 A M Medical Record Number: [id number] Patient Account Number: [id number] Date of Birth/Sex: Treating RN: June 21, 1971 (51 y.o. Debby Bud Primary Care Janiya Millirons: Shanon Rosser Other Clinician: Referring Rodgers Likes: Treating Zeven Kocak/Extender: Marchelle Gearing in Treatment: Elysian reviewed with physician Active Inactive Wound/Skin Impairment Nursing Diagnoses: Impaired tissue integrity Goals: Patient/caregiver will verbalize understanding of skin care regimen Date Initiated: 08/07/2021 Target Resolution Date: 10/25/2021 Goal Status: Active Ulcer/skin breakdown will have a volume reduction of 30% by week 4 Date Initiated: 08/07/2021 Target Resolution Date: 09/27/2021 Goal  Status: Active Interventions: Assess patient/caregiver ability to obtain necessary supplies Assess patient/caregiver ability to perform ulcer/skin care regimen upon admission and as needed Assess ulceration(s) every visit Provide education on ulcer and skin care Treatment Activities: Topical wound management initiated : 08/07/2021 Notes: Electronic Signature(s) Signed: 09/18/2021 6:06:00 PM By: Deon Pilling RN, BSN Entered By: Deon Pilling on 09/18/2021 08:28:12 -------------------------------------------------------------------------------- Pain Assessment Details Patient Name: Date of Service: [place]  Delane Ginger, [place] [name] 09/18/2021 8:15 A M Medical Record Number: [id number] Patient Account Number: [id number] Date of Birth/Sex: Treating RN: Apr 05, 1971 (51 y.o. Debby Bud Primary Care Jeovani Weisenburger: Shanon Rosser Other Clinician: Referring Krysti Hickling: Treating Deryl Giroux/Extender: Edythe Clarity Weeks in Treatment: 6 Active Problems Location of Pain Severity and Description of Pain Patient Has Paino No Site Locations Rate the pain. Rate the pain. Current Pain Level: 0 Pain Management and Medication Current Pain Management: Medication: No Cold Application: No Rest: No Massage: No Activity: No T.E.N.S.: No Heat Application: No Leg drop or elevation: No Is the Current Pain Management Adequate: Adequate How does your wound impact your activities of daily livingo Sleep: No Bathing: No Appetite: No Relationship With Others: No Bladder Continence: No Emotions: No Bowel Continence: No Work: No Toileting: No Drive: No Dressing: No Hobbies: No Engineer, maintenance) Signed: 09/18/2021 6:06:00 PM By: Deon Pilling RN, BSN Entered By: Deon Pilling on 09/18/2021 08:18:44 -------------------------------------------------------------------------------- Patient/Caregiver Education Details Patient Name: Date of Service: Kelli Churn [name] 2/22/2023andnbsp8:15 A  M Medical Record Number: [id number] Patient Account Number: [id number] Date of Birth/Gender: Treating RN: April 09, 1971 (51 y.o. Debby Bud Primary Care Physician: Shanon Rosser Other Clinician: Referring Physician: Treating Physician/Extender: Marchelle Gearing in Treatment: 6 Education Assessment Education Provided To: Patient Education Topics Provided Wound/Skin Impairment: Handouts: Skin Care Do's and Dont's Methods: Explain/Verbal Responses: Reinforcements needed Electronic Signature(s) Signed: 09/18/2021 6:06:00 PM By: Deon Pilling RN, BSN Entered By: Deon Pilling on 09/18/2021 08:28:26 -------------------------------------------------------------------------------- Wound Assessment Details Patient Name: Date of Service: Kelli Churn [name] 09/18/2021 8:15 A M Medical Record Number: [id number] Patient Account Number: [id number] Date of Birth/Sex: Treating RN: May 09, 1971 (51 y.o. Helene Shoe, Tammi Klippel Primary Care Boden Stucky: Shanon Rosser Other Clinician: Referring Jerami Tammen: Treating Terri Malerba/Extender: Edythe Clarity Weeks in Treatment: 6 Wound Status Wound Number: 1 Primary Infection - not elsewhere classified Etiology: Wound Location: Gluteal fold Wound Status: Open Wounding Event: Gradually Appeared Comorbid Anemia, Sleep Apnea, Hypertension, Type II Diabetes, Date Acquired: 07/27/2021 History: Osteoarthritis Weeks Of Treatment: 6 Clustered Wound: No Photos Wound Measurements Length: (cm) 4.8 Width: (cm) 1 Depth: (cm) 0.1 Area: (cm) 3.77 Volume: (cm) 0.377 % Reduction in Area: 90% % Reduction in Volume: 95% Epithelialization: Medium (34-66%) Tunneling: No Undermining: No Wound Description Classification: Full Thickness Without Exposed Support Structures Wound Margin: Distinct, outline attached Exudate Amount: Medium Exudate Type: Serosanguineous Exudate Color: red, brown Foul Odor After Cleansing: No Slough/Fibrino  No Wound Bed Granulation Amount: Large (67-100%) Exposed Structure Granulation Quality: Red Fascia Exposed: No Necrotic Amount: None Present (0%) Fat Layer (Subcutaneous Tissue) Exposed: Yes Tendon Exposed: No Muscle Exposed: No Joint Exposed: No Bone Exposed: No Treatment Notes Wound #1 (Gluteal fold) Cleanser Soap and Water Discharge Instruction: May shower and wash wound with dial antibacterial soap and water prior to dressing change. Peri-Wound Care Topical Primary Dressing Secondary Dressing T Non-Adherent Dressing, 3x4 in elfa Discharge Instruction: Apply over primary dressing as directed. Secured With Compression Wrap Compression Stockings Add-Ons Notes clinic has no coconut oil. Used T only in clinic. Graceyn Fodor in agreement. elfa Electronic Signature(s) Signed: 09/18/2021 6:06:00 PM By: Deon Pilling RN, BSN Entered By: Deon Pilling on 09/18/2021 08:26:13 -------------------------------------------------------------------------------- Vitals Details Patient Name: Date of Service: [name], [place] [name] 09/18/2021 8:15 A M Medical Record Number: [id number] Patient Account Number: [id number] Date of Birth/Sex: Treating RN: 1971-01-29 (51 y.o. Debby Bud Primary Care Vian Fluegel: Shanon Rosser Other Clinician: Referring Marlie Kuennen: Treating Pennye Beeghly/Extender:  [employer], [employer], Scott Weeks in Treatment: 6 Vital Signs Time Taken: 08:19 Temperature (F): 98.1 Height (in): 67 Pulse (bpm): 61 Weight (lbs): 278 Respiratory Rate (breaths/min): 20 Body Mass Index (BMI): 43.5 Blood Pressure (mmHg): 161/87 Capillary Blood Glucose (mg/dl): 101 Reference Range: 80 - 120 mg / dl Electronic Signature(s) Signed: 09/18/2021 6:06:00 PM By: Deon Pilling RN, BSN Entered By: Deon Pilling on 09/18/2021 11:65:79

## 2021-09-25 ENCOUNTER — Encounter: Payer: Self-pay | Admitting: Nurse Practitioner

## 2021-09-25 ENCOUNTER — Ambulatory Visit: Payer: 59 | Admitting: Nurse Practitioner

## 2021-09-25 VITALS — BP 172/94 | HR 56 | Temp 98.7°F | Ht 67.0 in | Wt 270.0 lb

## 2021-09-25 DIAGNOSIS — E119 Type 2 diabetes mellitus without complications: Secondary | ICD-10-CM | POA: Diagnosis not present

## 2021-09-25 DIAGNOSIS — Z7689 Persons encountering health services in other specified circumstances: Secondary | ICD-10-CM | POA: Diagnosis not present

## 2021-09-25 DIAGNOSIS — D649 Anemia, unspecified: Secondary | ICD-10-CM | POA: Diagnosis not present

## 2021-09-25 MED ORDER — FERROUS SULFATE 325 (65 FE) MG PO TBEC: 325.0000 mg | DELAYED_RELEASE_TABLET | Freq: Every day | ORAL | 1 refills | Status: AC

## 2021-09-25 MED ORDER — WEGOVY 0.5 MG/0.5ML ~~LOC~~ SOAJ: 0.5000 mg | SUBCUTANEOUS | 1 refills | Status: DC

## 2021-09-25 NOTE — Patient Instructions (Addendum)
Anemia °Anemia is a condition in which there is not enough red blood cells or hemoglobin in the blood. Hemoglobin is a substance in red blood cells that carries oxygen. °When you do not have enough red blood cells or hemoglobin (are anemic), your body cannot get enough oxygen and your organs may not work properly. As a result, you may feel very tired or have other problems. °What are the causes? °Common causes of anemia include: °Excessive bleeding. Anemia can be caused by excessive bleeding inside or outside the body, including bleeding from the intestines or from heavy menstrual periods in females. °Poor nutrition. °Long-lasting (chronic) kidney, thyroid, and liver disease. °Bone marrow disorders, spleen problems, and blood disorders. °Cancer and treatments for cancer. °HIV (human immunodeficiency virus) and AIDS (acquired immunodeficiency syndrome). °Infections, medicines, and autoimmune disorders that destroy red blood cells. °What are the signs or symptoms? °Symptoms of this condition include: °Minor weakness. °Dizziness. °Headache, or difficulties concentrating and sleeping. °Heartbeats that feel irregular or faster than normal (palpitations). °Shortness of breath, especially with exercise. °Pale skin, lips, and nails, or cold hands and feet. °Indigestion and nausea. °Symptoms may occur suddenly or develop slowly. If your anemia is mild, you may not have symptoms. °How is this diagnosed? °This condition is diagnosed based on blood tests, your medical history, and a physical exam. In some cases, a test may be needed in which cells are removed from the soft tissue inside of a bone and looked at under a microscope (bone marrow biopsy). Your health care provider may also check your stool (feces) for blood and may do additional testing to look for the cause of your bleeding. °Other tests may include: °Imaging tests, such as a CT scan or MRI. °A procedure to see inside your esophagus and stomach (endoscopy). °A  procedure to see inside your colon and rectum (colonoscopy). °How is this treated? °Treatment for this condition depends on the cause. If you continue to lose a lot of blood, you may need to be treated at a hospital. Treatment may include: °Taking supplements of iron, vitamin B12, or folic acid. °Taking a hormone medicine (erythropoietin) that can help to stimulate red blood cell growth. °Having a blood transfusion. This may be needed if you lose a lot of blood. °Making changes to your diet. °Having surgery to remove your spleen. °Follow these instructions at home: °Take over-the-counter and prescription medicines only as told by your health care provider. °Take supplements only as told by your health care provider. °Follow any diet instructions that you were given by your health care provider. °Keep all follow-up visits as told by your health care provider. This is important. °Contact a health care provider if: °You develop new bleeding anywhere in the body. °Get help right away if: °You are very weak. °You are short of breath. °You have pain in your abdomen or chest. °You are dizzy or feel faint. °You have trouble concentrating. °You have bloody stools, black stools, or tarry stools. °You vomit repeatedly or you vomit up blood. °These symptoms may represent a serious problem that is an emergency. Do not wait to see if the symptoms will go away. Get medical help right away. Call your local emergency services (911 in the [place] Do not drive yourself to the hospital. °Summary °Anemia is a condition in which you do not have enough red blood cells or enough of a substance in your red blood cells that carries oxygen (hemoglobin). °Symptoms may occur suddenly or develop slowly. °If your anemia is   mild, you may not have symptoms. This condition is diagnosed with blood tests, a medical history, and a physical exam. Other tests may be needed. Treatment for this condition depends on the cause of the anemia. This  information is not intended to replace advice given to you by your health care provider. Make sure you discuss any questions you have with your health care provider. Document Revised: 06/21/2019 Document Reviewed: 06/21/2019 Elsevier Patient Education  2022 [place]. Diabetes Insipidus Diabetes insipidus (DI) is a rare condition that causes the body to produce more urine than normal. This leads to thirst and low fluid in the body (dehydration). In this condition, the urine is made mostly of water, or dilute urine. DI affects mostly adults, but it can happen at any age. There are four types of DI: Central DI. This is the most common type. Dipsogenic DI. Nephrogenic DI. Gestational DI. The most common forms of this condition are caused by a decrease in the production of the hormone that regulates urine output (antidiuretic hormone), or the body's resistance to this hormone. This condition is not related to type 1 or type 2 diabetes mellitus. What are the causes? Central DI is caused by damage to the pituitary gland or hypothalamus in the brain. Dipsogenic DI is caused by a defect in the thirst mechanism in the brain. This defect causes you to drink too much fluid. These may result from: Brain surgery. Infection. Inflammation. Brain tumor. Head injury. Nephrogenic DI is caused by the kidneys not responding to the antidiuretic hormone in the body. This may result from: Chronic kidney disease (CKD). Certain medicines, such as lithium. Low potassium levels. High calcium levels. Gestational DI is rare and is caused by the antidiuretic hormone that has stopped working properly. What are the signs or symptoms? Symptoms of this condition include: Excessive urination. This means urinating more than 10 cups (2.4 L) during a period of 24 hours. Excessive thirst. Too much nighttime urination (nocturia). Nausea. Diarrhea. How is this diagnosed? This condition may be diagnosed based on: Your  medical history. A physical exam. Blood tests. Urine tests. A water deprivation test. During this test, you will stop drinking fluids for a period of time and your blood and urine will be checked regularly. An MRI. How is this treated? Once your specific type of diabetes insipidus is diagnosed, treatment may include one or more of the following: Increasing or limiting your fluid intake. Taking medicines that contain artificial (synthetic) versions of the antidiuretic hormone. Stopping certain medicines that you take. Correcting the balance of minerals (electrolytes) in your body. Changing your diet. You may be put on a low-protein and low-sodium diet. You may need to visit your health care provider regularly to make sure your condition is being treated properly. You may also need to work with providers who specialize in: Kidney problems (nephrologist). Hormone disorders (endocrinologist). Follow these instructions at home: Eating and drinking Follow instructions from your health care provider about how much fluid and water to drink. You may be directed to drink more fluids and water, or to limit how much fluid and water you drink. Follow instructions from your health care provider about eating or drinking restrictions. General instructions Take over-the-counter and prescription medicines only as told by your health care provider. If directed, monitor your risk of dehydration in extreme heat. Carry a medical alert card or wear medical alert jewelry. Keep all follow-up visits as told by your health care provider. This is important. You may need to  visit your health care provider regularly to make sure your condition is being treated properly. Contact a health care provider if: You continue to have symptoms after treatment. Get help right away if: You have extreme thirst. You have symptoms of severe dehydration, such as rapid heart rate, muscle cramps, or confusion. Summary Diabetes  insipidus (DI) is a rare condition that causes the body to produce more urine than normal, which leads to thirst and dehydration. Follow instructions from your health care provider about eating or drinking restrictions. Treatment may include increasing or limiting your fluid intake and correcting the balance of minerals (electrolytes) in your body. Get help right away if you have symptoms of severe dehydration, such as rapid heart rate, muscle cramps, or confusion. This information is not intended to replace advice given to you by your health care provider. Make sure you discuss any questions you have with your health care provider. Document Revised: 08/17/2019 Document Reviewed: 08/17/2019 Elsevier Patient Education  2022 [place]. Hypertension, Adult Hypertension is another name for high blood pressure. High blood pressure forces your heart to work harder to pump blood. This can cause problems over time. There are two numbers in a blood pressure reading. There is a top number (systolic) over a bottom number (diastolic). It is best to have a blood pressure that is below 120/80. Healthy choices can help lower your blood pressure, or you may need medicine to help lower it. What are the causes? The cause of this condition is not known. Some conditions may be related to high blood pressure. What increases the risk? Smoking. Having type 2 diabetes mellitus, high cholesterol, or both. Not getting enough exercise or physical activity. Being overweight. Having too much fat, sugar, calories, or salt (sodium) in your diet. Drinking too much alcohol. Having long-term (chronic) kidney disease. Having a family history of high blood pressure. Age. Risk increases with age. Race. You may be at higher risk if you are African American. Gender. Men are at higher risk than women before age 33. After age 36, women are at higher risk than men. Having obstructive sleep apnea. Stress. What are the signs or  symptoms? High blood pressure may not cause symptoms. Very high blood pressure (hypertensive crisis) may cause: Headache. Feelings of worry or nervousness (anxiety). Shortness of breath. Nosebleed. A feeling of being sick to your stomach (nausea). Throwing up (vomiting). Changes in how you see. Very bad chest pain. Seizures. How is this treated? This condition is treated by making healthy lifestyle changes, such as: Eating healthy foods. Exercising more. Drinking less alcohol. Your health care provider may prescribe medicine if lifestyle changes are not enough to get your blood pressure under control, and if: Your top number is above 130. Your bottom number is above 80. Your personal target blood pressure may vary. Follow these instructions at home: Eating and drinking  If told, follow the DASH eating plan. To follow this plan: Fill one half of your plate at each meal with fruits and vegetables. Fill one fourth of your plate at each meal with whole grains. Whole grains include whole-wheat pasta, brown rice, and whole-grain bread. Eat or drink low-fat dairy products, such as skim milk or low-fat yogurt. Fill one fourth of your plate at each meal with low-fat (lean) proteins. Low-fat proteins include fish, chicken without skin, eggs, beans, and tofu. Avoid fatty meat, cured and processed meat, or chicken with skin. Avoid pre-made or processed food. Eat less than 1,500 mg of salt each day. Do  not drink alcohol if: Your doctor tells you not to drink. You are pregnant, may be pregnant, or are planning to become pregnant. If you drink alcohol: Limit how much you use to: 0-1 drink a day for women. 0-2 drinks a day for men. Be aware of how much alcohol is in your drink. In the [place] one drink equals one 12 oz bottle of beer (355 mL), one 5 oz glass of wine (148 mL), or one 1 oz glass of hard liquor (44 mL). Lifestyle  Work with your doctor to stay at a healthy weight or to lose  weight. Ask your doctor what the best weight is for you. Get at least 30 minutes of exercise most days of the week. This may include walking, swimming, or biking. Get at least 30 minutes of exercise that strengthens your muscles (resistance exercise) at least 3 days a week. This may include lifting weights or doing Pilates. Do not use any products that contain nicotine or tobacco, such as cigarettes, e-cigarettes, and chewing tobacco. If you need help quitting, ask your doctor. Check your blood pressure at home as told by your doctor. Keep all follow-up visits as told by your doctor. This is important. Medicines Take over-the-counter and prescription medicines only as told by your doctor. Follow directions carefully. Do not skip doses of blood pressure medicine. The medicine does not work as well if you skip doses. Skipping doses also puts you at risk for problems. Ask your doctor about side effects or reactions to medicines that you should watch for. Contact a doctor if you: Think you are having a reaction to the medicine you are taking. Have headaches that keep coming back (recurring). Feel dizzy. Have swelling in your ankles. Have trouble with your vision. Get help right away if you: Get a very bad headache. Start to feel mixed up (confused). Feel weak or numb. Feel faint. Have very bad pain in your: Chest. Belly (abdomen). Throw up more than once. Have trouble breathing. Summary Hypertension is another name for high blood pressure. High blood pressure forces your heart to work harder to pump blood. For most people, a normal blood pressure is less than 120/80. Making healthy choices can help lower blood pressure. If your blood pressure does not get lower with healthy choices, you may need to take medicine. This information is not intended to replace advice given to you by your health care provider. Make sure you discuss any questions you have with your health care  provider. Document Revised: 03/24/2018 Document Reviewed: 03/24/2018 Elsevier Patient Education  [place].

## 2021-09-25 NOTE — Assessment & Plan Note (Signed)
Patient is establishing care today with a history of diabetes, hypertension and hyperlipidemia.  Education provided to patient for health maintenance, preventative care and the need to return for yearly physical.  Patient verbalized understanding ?

## 2021-09-25 NOTE — Assessment & Plan Note (Signed)
Started patient on Wegovy 0.5 mg weekly.  Patient will follow-up in 4 weeks.  Follow-up 3 months for diabetic/chronic disease management and labs/A1c. ?Diabetic diet encouraged, weight loss and exercise.  Monitor blood glucose levels at home follow-up with worsening or unresolved symptoms. ?

## 2021-09-25 NOTE — Progress Notes (Signed)
Duplicate

## 2021-09-25 NOTE — Assessment & Plan Note (Signed)
Started patient on FT3 25 mg tablet by mouth daily.  Patient will return in 6 to 12 weeks for repeat anemia,. ?
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
## 2021-09-26 ENCOUNTER — Encounter: Payer: Self-pay | Admitting: *Deleted

## 2021-10-02 ENCOUNTER — Encounter: Payer: Self-pay | Admitting: Nurse Practitioner

## 2021-10-02 ENCOUNTER — Encounter (HOSPITAL_BASED_OUTPATIENT_CLINIC_OR_DEPARTMENT_OTHER): Payer: 59 | Admitting: Physician Assistant

## 2021-10-04 ENCOUNTER — Encounter: Payer: Self-pay | Admitting: Nurse Practitioner

## 2021-10-04 ENCOUNTER — Ambulatory Visit: Payer: 59 | Admitting: Nurse Practitioner

## 2021-10-04 VITALS — BP 159/91 | HR 66 | Temp 98.5°F | Ht 67.0 in | Wt 259.0 lb

## 2021-10-04 DIAGNOSIS — I10 Essential (primary) hypertension: Secondary | ICD-10-CM | POA: Diagnosis not present

## 2021-10-04 DIAGNOSIS — M25552 Pain in left hip: Secondary | ICD-10-CM

## 2021-10-04 MED ORDER — METHYLPREDNISOLONE ACETATE 80 MG/ML IJ SUSP
80.0000 mg | Freq: Once | INTRAMUSCULAR | Status: AC
  Administered 2021-10-04: 80 mg via INTRAMUSCULAR
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
## 2021-10-04 NOTE — Patient Instructions (Signed)
Hip Pain The hip is the joint between the upper legs and the lower pelvis. The bones, cartilage, tendons, and muscles of your hip joint support your body and allow you to move around. Hip pain can range from a minor ache to severe pain in one or both of your hips. The pain may be felt on the inside of the hip joint near the groin, or on the outside near the buttocks and upper thigh. You may also have swelling or stiffness in your hip area. Follow these instructions at home: Managing pain, stiffness, and swelling   If directed, put ice on the painful area. To do this: Put ice in a plastic bag. Place a towel between your skin and the bag. Leave the ice on for 20 minutes, 2-3 times a day. If directed, apply heat to the affected area as often as told by your health care provider. Use the heat source that your health care provider recommends, such as a moist heat pack or a heating pad. Place a towel between your skin and the heat source. Leave the heat on for 20-30 minutes. Remove the heat if your skin turns bright red. This is especially important if you are unable to feel pain, heat, or cold. You may have a greater risk of getting burned. Activity Do exercises as told by your health care provider. Avoid activities that cause pain. General instructions  Take over-the-counter and prescription medicines only as told by your health care provider. Keep a journal of your symptoms. Write down: How often you have hip pain. The location of your pain. What the pain feels like. What makes the pain worse. Sleep with a pillow between your legs on your most comfortable side. Keep all follow-up visits as told by your health care provider. This is important. Contact a health care provider if: You cannot put weight on your leg. Your pain or swelling continues or gets worse after one week. It gets harder to walk. You have a fever. Get help right away if: You fall. You have a sudden increase in pain and  swelling in your hip. Your hip is red or swollen or very tender to touch. Summary Hip pain can range from a minor ache to severe pain in one or both of your hips. The pain may be felt on the inside of the hip joint near the groin, or on the outside near the buttocks and upper thigh. Avoid activities that cause pain. Write down how often you have hip pain, the location of the pain, what makes it worse, and what it feels like. This information is not intended to replace advice given to you by your health care provider. Make sure you discuss any questions you have with your health care provider. Document Revised: 11/29/2018 Document Reviewed: 11/29/2018 Elsevier Patient Education  2022 [employer]  

## 2021-10-07 ENCOUNTER — Telehealth: Payer: Self-pay | Admitting: Nurse Practitioner

## 2021-10-07 NOTE — Telephone Encounter (Signed)
Pt called stating that she was seen last week and got a shot and medicine for her hip and leg pain. Says neither the shot nor the medicine has helped and pt needs advise on what next steps are. Says she cant sleep at night because she is in a lot of pain. ? ?

## 2021-10-07 NOTE — Telephone Encounter (Signed)
Needs to follow up with JE ?

## 2021-10-07 NOTE — Telephone Encounter (Signed)
Je coverage, patient was seen 10/04/21, given Depo Medrol 80 mg, please advise. ?

## 2021-10-09 ENCOUNTER — Encounter: Payer: Self-pay | Admitting: Family Medicine

## 2021-10-09 ENCOUNTER — Ambulatory Visit: Payer: 59 | Admitting: Family Medicine

## 2021-10-09 VITALS — BP 161/97 | HR 63 | Ht 67.0 in | Wt 255.0 lb

## 2021-10-09 DIAGNOSIS — M79605 Pain in left leg: Secondary | ICD-10-CM
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
## 2021-10-11 ENCOUNTER — Ambulatory Visit (INDEPENDENT_AMBULATORY_CARE_PROVIDER_SITE_OTHER): Payer: 59 | Admitting: Family Medicine

## 2021-10-11 ENCOUNTER — Encounter: Payer: Self-pay | Admitting: Family Medicine

## 2021-10-11 DIAGNOSIS — I10 Essential (primary) hypertension: Secondary | ICD-10-CM

## 2021-10-11 MED ORDER — CARVEDILOL 6.25 MG PO TABS: 12.5000 mg | ORAL_TABLET | Freq: Two times a day (BID) | ORAL | 11 refills | Status: DC

## 2021-10-11 MED ORDER — METHOCARBAMOL 500 MG PO TABS: 500.0000 mg | ORAL_TABLET | Freq: Three times a day (TID) | ORAL | 0 refills | Status: DC | PRN
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
## 2021-10-15 NOTE — Telephone Encounter (Signed)
3 attempts to reach patient.. Mailbox full. MyChart message sent advising patient to schedule follow up ?

## 2021-10-16 ENCOUNTER — Encounter (HOSPITAL_BASED_OUTPATIENT_CLINIC_OR_DEPARTMENT_OTHER): Payer: 59 | Admitting: Physician Assistant

## 2021-10-17 ENCOUNTER — Other Ambulatory Visit: Payer: Self-pay

## 2021-10-28 DIAGNOSIS — E559 Vitamin D deficiency, unspecified: Secondary | ICD-10-CM | POA: Insufficient documentation

## 2021-10-28 DIAGNOSIS — N058 Unspecified nephritic syndrome with other morphologic changes: Secondary | ICD-10-CM | POA: Insufficient documentation

## 2021-11-01 ENCOUNTER — Encounter: Payer: Self-pay | Admitting: Emergency Medicine

## 2021-11-01 ENCOUNTER — Ambulatory Visit
Admission: EM | Admit: 2021-11-01 | Discharge: 2021-11-01 | Disposition: A | Discharge status: Home / Self Care | Payer: 59 | Attending: Family Medicine | Admitting: Family Medicine

## 2021-11-01 DIAGNOSIS — L03011 Cellulitis of right finger: Secondary | ICD-10-CM

## 2021-11-01 MED ORDER — DOXYCYCLINE HYCLATE 100 MG PO CAPS: 100.0000 mg | ORAL_CAPSULE | Freq: Two times a day (BID) | ORAL | 0 refills | Status: DC

## 2021-11-01 NOTE — ED Triage Notes (Signed)
Right middle finger infection about nail x 2 days. ?

## 2021-11-03 ENCOUNTER — Other Ambulatory Visit: Payer: Self-pay | Admitting: Family Medicine

## 2021-11-03 DIAGNOSIS — R142 Eructation: Secondary | ICD-10-CM

## 2021-11-03 DIAGNOSIS — A084 Viral intestinal infection, unspecified: Secondary | ICD-10-CM

## 2021-11-03 DIAGNOSIS — K219 Gastro-esophageal reflux disease without esophagitis: Secondary | ICD-10-CM

## 2021-11-03 DIAGNOSIS — K5909 Other constipation: Secondary | ICD-10-CM

## 2021-11-04 ENCOUNTER — Other Ambulatory Visit: Payer: Self-pay
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
## 2021-12-01 ENCOUNTER — Other Ambulatory Visit: Payer: Self-pay | Admitting: Family Medicine

## 2021-12-01 DIAGNOSIS — A084 Viral intestinal infection, unspecified: Secondary | ICD-10-CM

## 2021-12-01 DIAGNOSIS — K219 Gastro-esophageal reflux disease without esophagitis: Secondary | ICD-10-CM

## 2021-12-01 DIAGNOSIS — R142 Eructation: Secondary | ICD-10-CM

## 2021-12-01 DIAGNOSIS — K5909 Other constipation: Secondary | ICD-10-CM

## 2021-12-02 ENCOUNTER — Other Ambulatory Visit: Payer: Self-pay | Admitting: *Deleted

## 2021-12-02 ENCOUNTER — Encounter: Payer: Self-pay | Admitting: Nurse Practitioner

## 2021-12-02 DIAGNOSIS — K5909 Other constipation: Secondary | ICD-10-CM

## 2021-12-02 DIAGNOSIS — R142 Eructation: Secondary | ICD-10-CM

## 2021-12-02 DIAGNOSIS — K219 Gastro-esophageal reflux disease without esophagitis: Secondary | ICD-10-CM

## 2021-12-02 DIAGNOSIS — A084 Viral intestinal infection, unspecified: Secondary | ICD-10-CM

## 2021-12-02 MED ORDER — LINACLOTIDE 145 MCG PO CAPS: ORAL_CAPSULE | ORAL | 1 refills | Status: DC

## 2021-12-25 ENCOUNTER — Other Ambulatory Visit: Payer: Self-pay | Admitting: Nurse Practitioner

## 2021-12-25 ENCOUNTER — Telehealth: Payer: Self-pay | Admitting: Nurse Practitioner

## 2021-12-25 NOTE — Telephone Encounter (Signed)
Can something else be sent? Please advise

## 2021-12-25 NOTE — Telephone Encounter (Signed)
She can continue on current dose until next dose is available in the pharmacy, wegovy is on back order, it doesn't make sense to switch to a different medication and then go back in few weeks. Which dose do the pharmacy have available if she can tolerate high we can go up a dose or go back down a dose depending on what is available for a few weeks before supplies come back in

## 2021-12-25 NOTE — Telephone Encounter (Signed)
Addendum:  See if patient would like to try Saxenda and I will send medication to pharmacy.

## 2021-12-30 ENCOUNTER — Ambulatory Visit: Payer: 59 | Admitting: Nurse Practitioner

## 2021-12-30 ENCOUNTER — Encounter: Payer: Self-pay | Admitting: Nurse Practitioner

## 2022-01-03 ENCOUNTER — Ambulatory Visit (INDEPENDENT_AMBULATORY_CARE_PROVIDER_SITE_OTHER): Payer: 59 | Admitting: Nurse Practitioner

## 2022-01-03 ENCOUNTER — Encounter: Payer: Self-pay | Admitting: Nurse Practitioner

## 2022-01-03 VITALS — BP 135/90 | HR 50 | Ht 67.0 in | Wt 232.0 lb

## 2022-01-03 DIAGNOSIS — I10 Essential (primary) hypertension: Secondary | ICD-10-CM | POA: Diagnosis not present

## 2022-01-03 DIAGNOSIS — K219 Gastro-esophageal reflux disease without esophagitis: Secondary | ICD-10-CM | POA: Diagnosis not present

## 2022-01-03 DIAGNOSIS — E119 Type 2 diabetes mellitus without complications: Secondary | ICD-10-CM | POA: Diagnosis not present

## 2022-01-03 LAB — URINALYSIS, COMPLETE
Bilirubin, UA: NEGATIVE
Glucose, UA: NEGATIVE
Ketones, UA: NEGATIVE
Leukocytes,UA: NEGATIVE
Nitrite, UA: NEGATIVE
Specific Gravity, UA: 1.02 (ref 1.005–1.030)
Urobilinogen, Ur: 0.2 mg/dL (ref 0.2–1.0)
pH, UA: 5.5 (ref 5.0–7.5)

## 2022-01-03 LAB — MICROSCOPIC EXAMINATION: RBC, Urine: NONE SEEN /hpf (ref 0–2)

## 2022-01-03 LAB — BAYER DCA HB A1C WAIVED: HB A1C (BAYER DCA - WAIVED): 5.8 % — ABNORMAL HIGH (ref 4.8–5.6)

## 2022-01-03 LAB — GLUCOSE HEMOCUE WAIVED: Glu Hemocue Waived: 107 mg/dL — ABNORMAL HIGH (ref 70–99)

## 2022-01-03 MED ORDER — SEMAGLUTIDE (1 MG/DOSE) 4 MG/3ML ~~LOC~~ SOPN: 1.0000 mg | PEN_INJECTOR | SUBCUTANEOUS | 0 refills | Status: DC
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
## 2022-01-03 NOTE — Assessment & Plan Note (Addendum)
patient's symptoms well controlled on current medication.  No changes necessary.  Patient knows to follow-up with new or worsening symptoms.

## 2022-01-03 NOTE — Assessment & Plan Note (Signed)
Diabetes well controlled on Wegovy 25 mg tablet by mouth daily.  Patient will be started on Ozempic due to [facility] shortage Completed physical exam, patient will schedule Eye appointment, continue diabetic diet and exercise..  Education handout given to patient.  Follow-up 3 months Labs completed-CBC, CMP, lipid panel and A1c.

## 2022-01-03 NOTE — Patient Instructions (Signed)
Diabetes Insipidus Diabetes insipidus (DI) is a rare condition that causes the body to produce more urine than normal. This leads to thirst and low fluid in the body (dehydration). In this condition, the urine is made mostly of water, or dilute urine. DI affects mostly adults, but it can happen at any age. There are four types of DI: Central DI. This is the most common type. Dipsogenic DI. Nephrogenic DI. Gestational DI. The most common forms of this condition are caused by a decrease in the production of the hormone that regulates urine output (antidiuretic hormone), or the body's resistance to this hormone. This condition is not related to type 1 or type 2 diabetes mellitus. What are the causes? Central DI is caused by damage to the pituitary gland or hypothalamus in the brain. Dipsogenic DI is caused by a defect in the thirst mechanism in the brain. This defect causes you to drink too much fluid. These may result from: Brain surgery. Infection. Inflammation. Brain tumor. Head injury. Nephrogenic DI is caused by the kidneys not responding to the antidiuretic hormone in the body. This may result from: Chronic kidney disease (CKD). Certain medicines, such as lithium. Low potassium levels. High calcium levels. Gestational DI is rare and is caused by the antidiuretic hormone that has stopped working properly. What are the signs or symptoms? Symptoms of this condition include: Excessive urination. This means urinating more than 10 cups (2.4 L) during a period of 24 hours. Excessive thirst. Too much nighttime urination (nocturia). Nausea. Diarrhea. How is this diagnosed? This condition may be diagnosed based on: Your medical history. A physical exam. Blood tests. Urine tests. A water deprivation test. During this test, you will stop drinking fluids for a period of time and your blood and urine will be checked regularly. An MRI. How is this treated? Once your specific type of  diabetes insipidus is diagnosed, treatment may include one or more of the following: Increasing or limiting your fluid intake. Taking medicines that contain artificial (synthetic) versions of the antidiuretic hormone. Stopping certain medicines that you take. Correcting the balance of minerals (electrolytes) in your body. Changing your diet. You may be put on a low-protein and low-sodium diet. You may need to visit your health care provider regularly to make sure your condition is being treated properly. You may also need to work with providers who specialize in: Kidney problems (nephrologist). Hormone disorders (endocrinologist). Follow these instructions at home:  Eating and drinking Follow instructions from your health care provider about how much fluid and water to drink. You may be directed to drink more fluids and water, or to limit how much fluid and water you drink. Follow instructions from your health care provider about eating or drinking restrictions. General instructions Take over-the-counter and prescription medicines only as told by your health care provider. If directed, monitor your risk of dehydration in extreme heat. Carry a medical alert card or wear medical alert jewelry. Keep all follow-up visits as told by your health care provider. This is important. You may need to visit your health care provider regularly to make sure your condition is being treated properly. Contact a health care provider if: You continue to have symptoms after treatment. Get help right away if: You have extreme thirst. You have symptoms of severe dehydration, such as rapid heart rate, muscle cramps, or confusion. Summary Diabetes insipidus (DI) is a rare condition that causes the body to produce more urine than normal, which leads to thirst and dehydration. Follow instructions  from your health care provider about eating or drinking restrictions. Treatment may include increasing or limiting your  fluid intake and correcting the balance of minerals (electrolytes) in your body. Get help right away if you have symptoms of severe dehydration, such as rapid heart rate, muscle cramps, or confusion. This information is not intended to replace advice given to you by your health care provider. Make sure you discuss any questions you have with your health care provider. Document Revised: 08/17/2019 Document Reviewed: 08/17/2019 Elsevier Patient Education  2023 [employer]vier Inc.  

## 2022-01-04 LAB — CBC WITH DIFFERENTIAL/PLATELET
Basophils Absolute: 0 10*3/uL (ref 0.0–0.2)
Basos: 0 %
EOS (ABSOLUTE): 0 10*3/uL (ref 0.0–0.4)
Eos: 0 %
Hematocrit: 34.4 % (ref 34.0–46.6)
Hemoglobin: 10.8 g/dL — ABNORMAL LOW (ref 11.1–15.9)
Immature Grans (Abs): 0.1 10*3/uL (ref 0.0–0.1)
Immature Granulocytes: 1 %
Lymphocytes Absolute: 1.4 10*3/uL (ref 0.7–3.1)
Lymphs: 14 %
MCH: 25.4 pg — ABNORMAL LOW (ref 26.6–33.0)
MCHC: 31.4 g/dL — ABNORMAL LOW (ref 31.5–35.7)
MCV: 81 fL (ref 79–97)
Monocytes Absolute: 0.5 10*3/uL (ref 0.1–0.9)
Monocytes: 5 %
Neutrophils Absolute: 7.5 10*3/uL — ABNORMAL HIGH (ref 1.4–7.0)
Neutrophils: 80 %
Platelets: 384 10*3/uL (ref 150–450)
RBC: 4.25 x10E6/uL (ref 3.77–5.28)
RDW: 13.6 % (ref 11.7–15.4)
WBC: 9.4 10*3/uL (ref 3.4–10.8)

## 2022-01-04 LAB — CMP14+EGFR
ALT: 8 IU/L (ref 0–32)
AST: 5 IU/L (ref 0–40)
Albumin/Globulin Ratio: 1.8 (ref 1.2–2.2)
Albumin: 4.2 g/dL (ref 3.8–4.9)
Alkaline Phosphatase: 80 IU/L (ref 44–121)
BUN/Creatinine Ratio: 11 (ref 9–23)
BUN: 29 mg/dL — ABNORMAL HIGH (ref 6–24)
Bilirubin Total: 0.2 mg/dL (ref 0.0–1.2)
CO2: 22 mmol/L (ref 20–29)
Calcium: 9.8 mg/dL (ref 8.7–10.2)
Chloride: 102 mmol/L (ref 96–106)
Creatinine, Ser: 2.62 mg/dL — ABNORMAL HIGH (ref 0.57–1.00)
Globulin, Total: 2.3 g/dL (ref 1.5–4.5)
Glucose: 112 mg/dL — ABNORMAL HIGH (ref 70–99)
Potassium: 3.8 mmol/L (ref 3.5–5.2)
Sodium: 142 mmol/L (ref 134–144)
Total Protein: 6.5 g/dL (ref 6.0–8.5)
eGFR: 21 mL/min/{1.73_m2} — ABNORMAL LOW (ref >59)

## 2022-01-04 LAB — LIPID PANEL
Chol/HDL Ratio: 3.6 ratio (ref 0.0–4.4)
Cholesterol, Total: 251 mg/dL — ABNORMAL HIGH (ref 100–199)
HDL: 69 mg/dL (ref >39)
LDL Chol Calc (NIH): 158 mg/dL — ABNORMAL HIGH (ref 0–99)
Triglycerides: 136 mg/dL (ref 0–149)
VLDL Cholesterol Cal: 24 mg/dL (ref 5–40)

## 2022-01-06 ENCOUNTER — Other Ambulatory Visit: Payer: Self-pay | Admitting: Nurse Practitioner

## 2022-01-06 DIAGNOSIS — R8290 Unspecified abnormal findings in urine: Secondary | ICD-10-CM

## 2022-01-06 MED ORDER — FOSFOMYCIN TROMETHAMINE 3 G PO PACK: 3.0000 g | PACK | Freq: Once | ORAL | 0 refills | Status: AC

## 2022-01-06 MED ORDER — ATORVASTATIN CALCIUM 20 MG PO TABS: 20.0000 mg | ORAL_TABLET | Freq: Every day | ORAL | 2 refills | Status: DC

## 2022-01-10 NOTE — Telephone Encounter (Signed)
Ozempic was sent in for patient - patient aware and has picked up medication.

## 2022-01-23 ENCOUNTER — Other Ambulatory Visit: Payer: Self-pay | Admitting: Nurse Practitioner

## 2022-01-23 DIAGNOSIS — E119 Type 2 diabetes mellitus without complications: Secondary | ICD-10-CM

## 2022-02-18 ENCOUNTER — Telehealth: Payer: Self-pay | Admitting: Nurse Practitioner

## 2022-02-18 NOTE — Telephone Encounter (Signed)
Unfortunately the side effects of these GLP-1 (ozempic and wegovy) medications are Nausea, constipation, stomach pain, diarrhea and vomiting

## 2022-02-18 NOTE — Telephone Encounter (Signed)
Pt says the Ozempic is making her very constipated. Even when she takes her Linzess, she still can't use the bathroom.   Also says she was just watching television and saw that on CNN, it was reported that Ozempic and Wegovy causes stomach paralysis. Pt is worried about that. Needs advise.

## 2022-02-19 NOTE — Telephone Encounter (Signed)
Left message informing of Cheryl Scott's recommendations and to call back if needed.  Advised that her symptoms should improve with each dosage and that she needed to increase her water intake and avoid fried, fatty, greasy types of foods.

## 2022-04-08 ENCOUNTER — Ambulatory Visit (INDEPENDENT_AMBULATORY_CARE_PROVIDER_SITE_OTHER): Payer: 59 | Admitting: Nurse Practitioner

## 2022-04-08 ENCOUNTER — Encounter: Payer: Self-pay | Admitting: Nurse Practitioner

## 2022-04-08 VITALS — BP 105/71 | HR 58 | Temp 98.8°F | Ht 67.0 in | Wt 214.0 lb

## 2022-04-08 DIAGNOSIS — I10 Essential (primary) hypertension: Secondary | ICD-10-CM

## 2022-04-08 DIAGNOSIS — K219 Gastro-esophageal reflux disease without esophagitis: Secondary | ICD-10-CM

## 2022-04-08 DIAGNOSIS — Z0000 Encounter for general adult medical examination without abnormal findings: Secondary | ICD-10-CM

## 2022-04-08 DIAGNOSIS — E119 Type 2 diabetes mellitus without complications: Secondary | ICD-10-CM | POA: Diagnosis not present

## 2022-04-08 MED ORDER — PANTOPRAZOLE SODIUM 40 MG PO TBEC: DELAYED_RELEASE_TABLET | ORAL | 1 refills | Status: DC

## 2022-04-08 NOTE — Assessment & Plan Note (Signed)
Patient is progressively losing weight and being managed on GLP-1
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
## 2022-04-08 NOTE — Patient Instructions (Signed)
Diabetes Mellitus Action Plan Following a diabetes action plan is a way for you to manage your diabetes (diabetes mellitus) symptoms. The plan is color-coded to help you understand what actions you need to take based on any symptoms you are having. If you have symptoms in the red zone, you need medical care right away. If you have symptoms in the yellow zone, you are having problems. If you have symptoms in the green zone, you are doing well. Learning about and understanding diabetes can take time. Follow the plan that you develop with your health care provider. Know the target range for your blood sugar (glucose) level, and review your treatment plan with your health care provider at each visit. The target range for my blood sugar level is __________________________ mg/dL. Red zone Get medical help right away if you have any of the following symptoms: A blood sugar test result that is below 54 mg/dL (3 mmol/L). A blood sugar test result that is at or above 240 mg/dL (13.3 mmol/L) for 2 days in a row. Confusion or trouble thinking clearly. Difficulty breathing. Sickness or a fever for 2 or more days that is not getting better. Moderate or large ketone levels in your urine. Feeling tired or having no energy. If you have any red zone symptoms, do not wait to see if the symptoms will go away. Get medical help right away. Call your local emergency services (911 in the [place] Do not drive yourself to the hospital. If you have severely low blood sugar (severe hypoglycemia) and you cannot eat or drink, you may need glucagon. Make sure a family member or close friend knows how to check your blood sugar and how to give you glucagon. You may need to be treated in a hospital for this condition. Yellow zone If you have any of the following symptoms, your diabetes is not under control and you may need to make some changes: A blood sugar test result that is at or above 240 mg/dL (13.3 mmol/L) for 2 days in a  row. Blood sugar test results that are below 70 mg/dL (3.9 mmol/L). Other symptoms of hypoglycemia, such as: Shaking or feeling light-headed. Confusion or irritability. Feeling hungry. Having a fast heartbeat. If you have any yellow zone symptoms: Treat your hypoglycemia by eating or drinking 15 grams of a rapid-acting carbohydrate. Follow the 15:15 rule: Take 15 grams of a rapid-acting carbohydrate, such as: 1 tube of glucose gel. 4 glucose pills. 4 oz (120 mL) of fruit juice. 4 oz (120 mL) of regular (not diet) soda. Check your blood sugar 15 minutes after you take the carbohydrate. If the repeat blood sugar test is still at or below 70 mg/dL (3.9 mmol/L), take 15 grams of a carbohydrate again. If your blood sugar does not increase above 70 mg/dL (3.9 mmol/L) after 3 tries, get medical help right away. After your blood sugar returns to normal, eat a meal or a snack within 1 hour. Keep taking your daily medicines as told by your health care provider. Check your blood sugar more often than you normally would. Write down your results. Call your health care provider if you have trouble keeping your blood sugar in your target range.  Green zone These signs mean you are doing well and you can continue what you are doing to manage your diabetes: Your blood sugar is within your personal target range. For most people, a blood sugar level before a meal (preprandial) should be 80-130 mg/dL (4.4-7.2 mmol/L). You feel   well, and you are able to do daily activities. If you are in the green zone, continue to manage your diabetes as told by your health care provider. To do this: Eat a healthy diet. Exercise regularly. Check your blood sugar as told by your health care provider. Take your medicines as told by your health care provider.  Where to find more information American Diabetes Association (ADA): diabetes.org Association of Diabetes Care & Education Specialists (ADCES):  diabeteseducator.org Summary Following a diabetes action plan is a way for you to manage your diabetes symptoms. The plan is color-coded to help you understand what actions you need to take based on any symptoms you are having. Follow the plan that you develop with your health care provider. Make sure you know your personal target blood sugar level. Review your treatment plan with your health care provider at each visit. This information is not intended to replace advice given to you by your health care provider. Make sure you discuss any questions you have with your health care provider. Document Revised: 01/19/2020 Document Reviewed: 01/19/2020 Elsevier Patient Education  2023 Elsevier Inc.  

## 2022-04-08 NOTE — Assessment & Plan Note (Signed)
Patient's symptoms well controlled no changes necessary.

## 2022-04-08 NOTE — Assessment & Plan Note (Signed)
Hypertension well-controlled on current medication no changes necessary.  Follow-up as scheduled in 3 months.  Labs will be completed tomorrow

## 2022-04-11 ENCOUNTER — Encounter: Payer: Self-pay | Admitting: *Deleted

## 2022-04-29 ENCOUNTER — Telehealth: Payer: Self-pay | Admitting: Nurse Practitioner

## 2022-04-29 ENCOUNTER — Telehealth: Payer: Self-pay

## 2022-04-29 NOTE — Telephone Encounter (Signed)
I can try a different pharmacy , if she gives ma a name of where she wants me to send it, and Lottie Dawson may be able to inform us about samples. Thank you

## 2022-04-29 NOTE — Telephone Encounter (Signed)
R/C about Ozempic RX or samples

## 2022-04-29 NOTE — Telephone Encounter (Signed)
Patient calling to let us know that her OZEMPIC, 1 MG/DOSE, 4 MG/3ML SOPN is on backorder. She would like to know if we have samples or if it can be called in to a different pharmacy. Please call back and advise.

## 2022-04-29 NOTE — Telephone Encounter (Signed)
Attempted to call pt back , no answer left vm

## 2022-04-30 NOTE — Telephone Encounter (Signed)
Thank you , I will wait for patient to let me know which is her preferred pharmacy

## 2022-04-30 NOTE — Telephone Encounter (Signed)
Ozempic does not provide [place] with '1mg'$  samples Please let us know which other pharmacy she would like [place] to use--[facility])--I don't know what is close to her home ([facility] to [facility] '[facility] ) Medication is still in her system so okay! We only have the 0.'5mg'$  samples, but [facility] of converting her to a sample

## 2022-05-01 NOTE — Telephone Encounter (Signed)
Lmtcb.

## 2022-05-02 ENCOUNTER — Telehealth: Payer: Self-pay | Admitting: Nurse Practitioner

## 2022-05-02 ENCOUNTER — Encounter: Payer: Self-pay | Admitting: Nurse Practitioner

## 2022-05-02 ENCOUNTER — Other Ambulatory Visit: Payer: Self-pay | Admitting: Nurse Practitioner

## 2022-05-02 NOTE — Telephone Encounter (Signed)
[facility] [place] [facility], 1 MG/DOSE, 4 MG/3ML SOPN in '4mg'$ , only in lower dose. She would like to know if something else can be called in. Please call back.

## 2022-05-05 ENCOUNTER — Other Ambulatory Visit: Payer: Self-pay | Admitting: Nurse Practitioner

## 2022-05-05 NOTE — Telephone Encounter (Signed)
Hello Cheryl Scott, looks like your last A1c was in June and you were 5.8, what current dose of Ozempic are you on? Since we have had few gaps in obtaining medication from the pharmacy. For diabetic maintenance and glycemic control, you're not supposed to be more than 2 mg weekly , the last refill you were on 1 mg weekly. Thanks, let me know and weill go from there.

## 2022-05-08 ENCOUNTER — Other Ambulatory Visit: Payer: Self-pay | Admitting: Nurse Practitioner

## 2022-05-22 NOTE — Telephone Encounter (Signed)
Tried to contact pt again no answer left vm

## 2022-05-30 ENCOUNTER — Telehealth: Payer: Self-pay | Admitting: *Deleted

## 2022-05-30 NOTE — Telephone Encounter (Signed)
PA for Ozempic-In Process  Key: [facility]  PA#  [id number]   Your information has been submitted to [facility]. To check for an updated outcome later, reopen this PA request from your dashboard.  If [facility] has not responded to your request within 24 hours, contact [facility] at [phone]. If you think there may be a problem with your PA request, use our live chat feature at the bottom right.

## 2022-05-30 NOTE — Telephone Encounter (Signed)
Ozempic approved from 05/30/2022 to 05/30/2025  Pharmacy aware.

## 2022-07-29 ENCOUNTER — Ambulatory Visit: Payer: 59 | Admitting: Nurse Practitioner

## 2022-07-29 ENCOUNTER — Encounter: Payer: Self-pay | Admitting: Nurse Practitioner

## 2022-07-29 VITALS — BP 138/88 | HR 61 | Temp 98.7°F | Ht 67.0 in | Wt 216.0 lb

## 2022-07-29 DIAGNOSIS — E119 Type 2 diabetes mellitus without complications: Secondary | ICD-10-CM | POA: Diagnosis not present

## 2022-07-29 DIAGNOSIS — I10 Essential (primary) hypertension: Secondary | ICD-10-CM

## 2022-07-29 LAB — BAYER DCA HB A1C WAIVED: HB A1C (BAYER DCA - WAIVED): 6.5 % — ABNORMAL HIGH (ref 4.8–5.6)

## 2022-07-29 MED ORDER — SERTRALINE HCL 100 MG PO TABS: 100.0000 mg | ORAL_TABLET | Freq: Two times a day (BID) | ORAL | 3 refills | Status: DC

## 2022-07-29 MED ORDER — LOSARTAN POTASSIUM 25 MG PO TABS: 12.5000 mg | ORAL_TABLET | Freq: Every day | ORAL | 3 refills | Status: DC

## 2022-07-29 MED ORDER — CARVEDILOL 6.25 MG PO TABS: 12.5000 mg | ORAL_TABLET | Freq: Two times a day (BID) | ORAL | 11 refills | Status: AC

## 2022-07-29 MED ORDER — ATORVASTATIN CALCIUM 20 MG PO TABS: 20.0000 mg | ORAL_TABLET | Freq: Every day | ORAL | 0 refills | Status: DC

## 2022-07-29 NOTE — Assessment & Plan Note (Signed)
Symptoms well controlled on current medication. No changes needed, Labs completed results pending. Follow up in 6 months.

## 2022-07-29 NOTE — Progress Notes (Signed)
Established Patient Office Visit  Subjective   Patient ID: Cheryl Scott, female    DOB: 09/22/1970  Age: 52 y.o. MRN: [id number]  Chief Complaint  Patient presents with   Abdominal Pain    Pt states she is still having a lot of stomach pain     Abdominal Pain This is a new problem. The current episode started in the past 7 days. The onset quality is gradual. The problem occurs intermittently. The problem has been unchanged. The pain is located in the generalized abdominal region. The pain is at a severity of 3/10. The pain is mild. The quality of the pain is aching. The abdominal pain does not radiate. Associated symptoms include flatus. Pertinent negatives include no constipation, diarrhea, fever, nausea or vomiting. Nothing aggravates the pain. The pain is relieved by Nothing. She has tried nothing for the symptoms. The treatment provided no relief. Prior diagnostic workup includes GI consult.  Hypertension This is a chronic problem. The problem has been gradually improving since onset. The problem is controlled. Pertinent negatives include no blurred vision or chest pain. Risk factors for coronary artery disease include obesity, dyslipidemia and diabetes mellitus. Past treatments include beta blockers and calcium channel blockers. The current treatment provides significant improvement. There are no compliance problems.   Diabetes She presents for her follow-up diabetic visit. She has type 2 diabetes mellitus. The initial diagnosis of diabetes was made 7 years ago. Her disease course has been fluctuating. There are no hypoglycemic associated symptoms. Pertinent negatives for diabetes include no blurred vision, no chest pain, no polydipsia and no polyphagia. There are no hypoglycemic complications. Symptoms are improving. There are no diabetic complications. Risk factors for coronary artery disease include dyslipidemia, diabetes mellitus and obesity. When asked about current treatments, none  were reported. She is compliant with treatment all of the time.      Review of Systems  Constitutional: Negative.  Negative for chills and fever.  HENT: Negative.    Eyes:  Negative for blurred vision.  Cardiovascular:  Negative for chest pain.  Gastrointestinal:  Positive for abdominal pain and flatus. Negative for constipation, diarrhea, nausea and vomiting.  Skin: Negative.  Negative for itching and rash.  Endo/Heme/Allergies:  Negative for polydipsia and polyphagia.  All other systems reviewed and are negative.     Objective:     BP 138/88   Pulse 61   Temp 98.7 F (37.1 C)   Ht _0  (1.702 m)   Wt 216 lb (98 kg)   SpO2 100%   BMI 33.83 kg/m  BP Readings from Last 3 Encounters:  07/29/22 138/88  04/08/22 105/71  01/03/22 135/90   Wt Readings from Last 3 Encounters:  07/29/22 216 lb (98 kg)  04/08/22 214 lb (97.1 kg)  01/03/22 232 lb (105.2 kg)      Physical Exam Vitals and nursing note reviewed.  Constitutional:      Appearance: She is overweight.  HENT:     Head: Normocephalic.     Right Ear: External ear normal.     Left Ear: External ear normal.     Nose: Nose normal.     Mouth/Throat:     Mouth: Mucous membranes are moist.  Eyes:     Conjunctiva/sclera: Conjunctivae normal.  Cardiovascular:     Rate and Rhythm: Normal rate and regular rhythm.     Pulses: Normal pulses.     Heart sounds: Normal heart sounds.  Pulmonary:     Effort: Pulmonary effort  is normal.     Breath sounds: Normal breath sounds.  Abdominal:     General: Bowel sounds are normal.     Palpations: Abdomen is soft.     Tenderness: There is abdominal tenderness.  Musculoskeletal:        General: Normal range of motion.  Skin:    General: Skin is warm.     Findings: No erythema or rash.  Neurological:     General: No focal deficit present.     Mental Status: She is alert and oriented to person, place, and time.  Psychiatric:        Behavior: Behavior is cooperative.       Results for orders placed or performed in visit on 07/29/22  Bayer DCA Hb A1c Waived  Result Value Ref Range   HB A1C (BAYER DCA - WAIVED) 6.5 (H) 4.8 - 5.6 %    Last CBC Lab Results  Component Value Date   WBC 9.4 01/03/2022   HGB 10.8 (L) 01/03/2022   HCT 34.4 01/03/2022   MCV 81 01/03/2022   MCH 25.4 (L) 01/03/2022   RDW 13.6 01/03/2022   PLT 384 24/26/8341   Last metabolic panel Lab Results  Component Value Date   GLUCOSE 112 (H) 01/03/2022   NA 142 01/03/2022   K 3.8 01/03/2022   CL 102 01/03/2022   CO2 22 01/03/2022   BUN 29 (H) 01/03/2022   CREATININE 2.62 (H) 01/03/2022   EGFR 21 (L) 01/03/2022   CALCIUM 9.8 01/03/2022   PHOS 3.5 06/14/2021   PROT 6.5 01/03/2022   ALBUMIN 4.2 01/03/2022   LABGLOB 2.3 01/03/2022   AGRATIO 1.8 01/03/2022   BILITOT 0.2 01/03/2022   ALKPHOS 80 01/03/2022   AST 5 01/03/2022   ALT 8 01/03/2022   ANIONGAP 6 06/14/2021   Last lipids Lab Results  Component Value Date   CHOL 251 (H) 01/03/2022   HDL 69 01/03/2022   LDLCALC 158 (H) 01/03/2022   TRIG 136 01/03/2022   CHOLHDL 3.6 01/03/2022   Last hemoglobin A1c Lab Results  Component Value Date   HGBA1C 6.5 (H) 07/29/2022   Last thyroid functions Lab Results  Component Value Date   TSH 3.87 02/10/2018   Last vitamin D Lab Results  Component Value Date   VD25OH 29 (L) 01/05/2020   Last vitamin B12 and Folate Lab Results  Component Value Date   VITAMINB12 461 12/28/2020   FOLATE 6.9 12/28/2020      The 10-year ASCVD risk score (Arnett DK, et al., 2019) is: 12.4%    Assessment & Plan:   Problem List Items Addressed This Visit       Cardiovascular and Mediastinum   Essential hypertension - Primary    Symptoms well controlled on current medication. No changes needed, Labs completed results pending. Follow up in 6 months.         Relevant Medications   atorvastatin (LIPITOR) 20 MG tablet   carvedilol (COREG) 6.25 MG tablet   losartan (COZAAR)  25 MG tablet     Endocrine   Diabetes mellitus type 2, controlled, without complications ([place])    Patient is unable to tolerate Ozempic and had not had any dose in the past 3 weeks. She has had a significant amount of weight. Patient reports that diabetes is well controlled. She did not bring a blood glucose log to clinic. But knows the signs of hypo and hyper glycemia.   Labs completed results pending.  Relevant Medications   atorvastatin (LIPITOR) 20 MG tablet   carvedilol (COREG) 6.25 MG tablet   losartan (COZAAR) 25 MG tablet   Other Relevant Orders   Bayer DCA Hb A1c Waived (Completed)   CBC with Differential/Platelet   CMP14+EGFR   Lipid panel   Microalbumin / creatinine urine ratio    Return in about 6 months (around 01/27/2023) for chronic disease mgt.    [name], NP

## 2022-07-29 NOTE — Assessment & Plan Note (Signed)
Patient is unable to tolerate Ozempic and had not had any dose in the past 3 weeks. She has had a significant amount of weight. Patient reports that diabetes is well controlled. She did not bring a blood glucose log to clinic. But knows the signs of hypo and hyper glycemia.   Labs completed results pending.

## 2022-07-29 NOTE — Patient Instructions (Signed)
Diabetes Mellitus Basics  Diabetes mellitus, or diabetes, is a long-term (chronic) disease. It occurs when the body does not properly use sugar (glucose) that is released from food after you eat. Diabetes mellitus may be caused by one or both of these problems: Your pancreas does not make enough of a hormone called insulin. Your body does not react in a normal way to the insulin that it makes. Insulin lets glucose enter cells in your body. This gives you energy. If you have diabetes, glucose cannot get into cells. This causes high blood glucose (hyperglycemia). How to treat and manage diabetes You may need to take insulin or other diabetes medicines daily to keep your glucose in balance. If you are prescribed insulin, you will learn how to give yourself insulin by injection. You may need to adjust the amount of insulin you take based on the foods that you eat. You will need to check your blood glucose levels using a glucose monitor as told by your health care provider. The readings can help determine if you have low or high blood glucose. Generally, you should have these blood glucose levels: Before meals (preprandial): 80-130 mg/dL (4.4-7.2 mmol/L). After meals (postprandial): below 180 mg/dL (10 mmol/L). Hemoglobin A1c (HbA1c) level: less than 7%. Your health care provider will set treatment goals for you. Keep all follow-up visits. This is important. Follow these instructions at home: Diabetes medicines Take your diabetes medicines every day as told by your health care provider. List your diabetes medicines here: Name of medicine: ______________________________ Amount (dose): _______________ Time (a.m./p.m.): _______________ Notes: ___________________________________ Name of medicine: ______________________________ Amount (dose): _______________ Time (a.m./p.m.): _______________ Notes: ___________________________________ Name of medicine: ______________________________ Amount (dose):  _______________ Time (a.m./p.m.): _______________ Notes: ___________________________________ Insulin If you use insulin, list the types of insulin you use here: Insulin type: ______________________________ Amount (dose): _______________ Time (a.m./p.m.): _______________Notes: ___________________________________ Insulin type: ______________________________ Amount (dose): _______________ Time (a.m./p.m.): _______________ Notes: ___________________________________ Insulin type: ______________________________ Amount (dose): _______________ Time (a.m./p.m.): _______________ Notes: ___________________________________ Insulin type: ______________________________ Amount (dose): _______________ Time (a.m./p.m.): _______________ Notes: ___________________________________ Insulin type: ______________________________ Amount (dose): _______________ Time (a.m./p.m.): _______________ Notes: ___________________________________ Managing blood glucose  Check your blood glucose levels using a glucose monitor as told by your health care provider. Write down the times that you check your glucose levels here: Time: _______________ Notes: ___________________________________ Time: _______________ Notes: ___________________________________ Time: _______________ Notes: ___________________________________ Time: _______________ Notes: ___________________________________ Time: _______________ Notes: ___________________________________ Time: _______________ Notes: ___________________________________  Low blood glucose Low blood glucose (hypoglycemia) is when glucose is at or below 70 mg/dL (3.9 mmol/L). Symptoms may include: Feeling: Hungry. Sweaty and clammy. Irritable or easily upset. Dizzy. Sleepy. Having: A fast heartbeat. A headache. A change in your vision. Numbness around the mouth, lips, or tongue. Having trouble with: Moving (coordination). Sleeping. Treating low blood glucose To treat low blood  glucose, eat or drink something containing sugar right away. If you can think clearly and swallow safely, follow the 15:15 rule: Take 15 grams of a fast-acting carb (carbohydrate), as told by your health care provider. Some fast-acting carbs are: Glucose tablets: take 3-4 tablets. Hard candy: eat 3-5 pieces. Fruit juice: drink 4 oz (120 mL). Regular (not diet) soda: drink 4-6 oz (120-180 mL). Honey or sugar: eat 1 Tbsp (15 mL). Check your blood glucose levels 15 minutes after you take the carb. If your glucose is still at or below 70 mg/dL (3.9 mmol/L), take 15 grams of a carb again. If your glucose does not go above 70 mg/dL (3.9 mmol/L) after   3 tries, get help right away. After your glucose goes back to normal, eat a meal or a snack within 1 hour. Treating very low blood glucose If your glucose is at or below 54 mg/dL (3 mmol/L), you have very low blood glucose (severe hypoglycemia). This is an emergency. Do not wait to see if the symptoms will go away. Get medical help right away. Call your local emergency services (911 in the [place] Do not drive yourself to the hospital. Questions to ask your health care provider Should I talk with a diabetes educator? What equipment will I need to care for myself at home? What diabetes medicines do I need? When should I take them? How often do I need to check my blood glucose levels? What number can I call if I have questions? When is my follow-up visit? Where can I find a support group for people with diabetes? Where to find more information American Diabetes Association: www.diabetes.org Association of Diabetes Care and Education Specialists: www.diabeteseducator.org Contact a health care provider if: Your blood glucose is at or above 240 mg/dL (13.3 mmol/L) for 2 days in a row. You have been sick or have had a fever for 2 days or more, and you are not getting better. You have any of these problems for more than 6 hours: You cannot eat or  drink. You feel nauseous. You vomit. You have diarrhea. Get help right away if: Your blood glucose is lower than 54 mg/dL (3 mmol/L). You get confused. You have trouble thinking clearly. You have trouble breathing. These symptoms may represent a serious problem that is an emergency. Do not wait to see if the symptoms will go away. Get medical help right away. Call your local emergency services (911 in the [place] Do not drive yourself to the hospital. Summary Diabetes mellitus is a chronic disease that occurs when the body does not properly use sugar (glucose) that is released from food after you eat. Take insulin and diabetes medicines as told. Check your blood glucose every day, as often as told. Keep all follow-up visits. This is important. This information is not intended to replace advice given to you by your health care provider. Make sure you discuss any questions you have with your health care provider. Document Revised: 11/15/2019 Document Reviewed: 11/15/2019 Elsevier Patient Education  2023 [employer]  

## 2022-07-30 LAB — CMP14+EGFR
ALT: 13 IU/L (ref 0–32)
AST: 12 IU/L (ref 0–40)
Albumin/Globulin Ratio: 1.7 (ref 1.2–2.2)
Albumin: 4.3 g/dL (ref 3.8–4.9)
Alkaline Phosphatase: 107 IU/L (ref 44–121)
BUN/Creatinine Ratio: 11 (ref 9–23)
BUN: 27 mg/dL — ABNORMAL HIGH (ref 6–24)
Bilirubin Total: 0.3 mg/dL (ref 0.0–1.2)
CO2: 25 mmol/L (ref 20–29)
Calcium: 9.4 mg/dL (ref 8.7–10.2)
Chloride: 100 mmol/L (ref 96–106)
Creatinine, Ser: 2.36 mg/dL — ABNORMAL HIGH (ref 0.57–1.00)
Globulin, Total: 2.5 g/dL (ref 1.5–4.5)
Glucose: 92 mg/dL (ref 70–99)
Potassium: 4.2 mmol/L (ref 3.5–5.2)
Sodium: 142 mmol/L (ref 134–144)
Total Protein: 6.8 g/dL (ref 6.0–8.5)
eGFR: 24 mL/min/{1.73_m2} — ABNORMAL LOW (ref >59)

## 2022-07-30 LAB — CBC WITH DIFFERENTIAL/PLATELET
Basophils Absolute: 0 10*3/uL (ref 0.0–0.2)
Basos: 0 %
EOS (ABSOLUTE): 0.1 10*3/uL (ref 0.0–0.4)
Eos: 1 %
Hematocrit: 36.3 % (ref 34.0–46.6)
Hemoglobin: 11.2 g/dL (ref 11.1–15.9)
Immature Grans (Abs): 0 10*3/uL (ref 0.0–0.1)
Immature Granulocytes: 0 %
Lymphocytes Absolute: 2.2 10*3/uL (ref 0.7–3.1)
Lymphs: 23 %
MCH: 25.9 pg — ABNORMAL LOW (ref 26.6–33.0)
MCHC: 30.9 g/dL — ABNORMAL LOW (ref 31.5–35.7)
MCV: 84 fL (ref 79–97)
Monocytes Absolute: 0.7 10*3/uL (ref 0.1–0.9)
Monocytes: 7 %
Neutrophils Absolute: 6.6 10*3/uL (ref 1.4–7.0)
Neutrophils: 69 %
Platelets: 399 10*3/uL (ref 150–450)
RBC: 4.33 x10E6/uL (ref 3.77–5.28)
RDW: 13.7 % (ref 11.7–15.4)
WBC: 9.6 10*3/uL (ref 3.4–10.8)

## 2022-07-30 LAB — MICROALBUMIN / CREATININE URINE RATIO
Creatinine, Urine: 85.6 mg/dL
Microalb/Creat Ratio: 248 mg/g creat — ABNORMAL HIGH (ref 0–29)
Microalbumin, Urine: 212.3 ug/mL

## 2022-07-30 LAB — LIPID PANEL
Chol/HDL Ratio: 3 ratio (ref 0.0–4.4)
Cholesterol, Total: 166 mg/dL (ref 100–199)
HDL: 55 mg/dL (ref >39)
LDL Chol Calc (NIH): 81 mg/dL (ref 0–99)
Triglycerides: 177 mg/dL — ABNORMAL HIGH (ref 0–149)
VLDL Cholesterol Cal: 30 mg/dL (ref 5–40)

## 2022-07-31 ENCOUNTER — Other Ambulatory Visit: Payer: Self-pay | Admitting: Nurse Practitioner

## 2022-07-31 DIAGNOSIS — E781 Pure hyperglyceridemia: Secondary | ICD-10-CM

## 2022-07-31 MED ORDER — OMEGA-3 FATTY ACIDS 1000 MG PO CAPS: 2.0000 g | ORAL_CAPSULE | Freq: Every day | ORAL | 4 refills | Status: DC

## 2022-08-05 ENCOUNTER — Encounter: Payer: Self-pay | Admitting: Internal Medicine

## 2022-08-06 ENCOUNTER — Other Ambulatory Visit: Payer: Self-pay | Admitting: Nurse Practitioner

## 2022-08-19 ENCOUNTER — Other Ambulatory Visit: Payer: Self-pay | Admitting: Nurse Practitioner

## 2022-09-02 ENCOUNTER — Ambulatory Visit: Payer: 59 | Admitting: Family Medicine

## 2022-09-05 ENCOUNTER — Telehealth: Payer: Self-pay

## 2022-09-05 ENCOUNTER — Telehealth: Payer: 59 | Admitting: Physician Assistant

## 2022-09-05 DIAGNOSIS — R3989 Other symptoms and signs involving the genitourinary system: Secondary | ICD-10-CM | POA: Diagnosis not present

## 2022-09-05 MED ORDER — SULFAMETHOXAZOLE-TRIMETHOPRIM 800-160 MG PO TABS: 1.0000 | ORAL_TABLET | Freq: Two times a day (BID) | ORAL | 0 refills | Status: DC

## 2022-09-05 NOTE — Telephone Encounter (Signed)
Spoke with pt. Advised that in Jan her microalbumin was checked. There was not any testing for UA, culture therefore we would not know if she has an active UTI. Offered to make an appt for early next week and pt declined. Advised to do an evisit through her mychart and pt believes that she will do that instead. Instructed pt to call back if needed.

## 2022-09-05 NOTE — Telephone Encounter (Signed)
Pt called requesting to speak with a nurse. Says she feels like she has had a UTI for the past 3 wks but says she also has kidney issues and wants someone to look at her last labs and tell her if anything showed up in her urine.

## 2022-09-05 NOTE — Progress Notes (Signed)
E-Visit for Urinary Problems  We are sorry that you are not feeling well.  Here is how we plan to help!  Based on what you shared with me it looks like you most likely have a simple urinary tract infection.  A UTI (Urinary Tract Infection) is a bacterial infection of the bladder.  Most cases of urinary tract infections are simple to treat but a key part of your care is to encourage you to drink plenty of fluids and watch your symptoms carefully.  I have prescribed Bactrim DS One tablet twice a day for 5 days.  Your symptoms should gradually improve. Call us if the burning in your urine worsens, you develop worsening fever, back pain or pelvic pain or if your symptoms do not resolve after completing the antibiotic.  Urinary tract infections can be prevented by drinking plenty of water to keep your body hydrated.  Also be sure when you wipe, wipe from front to back and don't hold it in!  If possible, empty your bladder every 4 hours.  HOME CARE Drink plenty of fluids Compete the full course of the antibiotics even if the symptoms resolve Remember, when you need to go.go. Holding in your urine can increase the likelihood of getting a UTI! GET HELP RIGHT AWAY IF: You cannot urinate You get a high fever Worsening back pain occurs You see blood in your urine You feel sick to your stomach or throw up You feel like you are going to pass out  MAKE SURE YOU  Understand these instructions. Will watch your condition. Will get help right away if you are not doing well or get worse.   Thank you for choosing an e-visit.  Your e-visit answers were reviewed by a board certified advanced clinical practitioner to complete your personal care plan. Depending upon the condition, your plan could have included both over the counter or prescription medications.  Please review your pharmacy choice. Make sure the pharmacy is open so you can pick up prescription now. If there is a problem, you may contact  your provider through [employer] and have the prescription routed to another pharmacy.  Your safety is important to [place]. If you have drug allergies check your prescription carefully.   For the next 24 hours you can use MyChart to ask questions about today's visit, request a non-urgent call back, or ask for a work or school excuse. You will get an email in the next two days asking about your experience. I hope that your e-visit has been valuable and will speed your recovery.  I have spent 5 minutes in review of e-visit questionnaire, review and updating patient chart, medical decision making and response to patient.   Mar Daring, PA-C

## 2022-09-06 ENCOUNTER — Telehealth: Payer: 59 | Admitting: Nurse Practitioner

## 2022-09-06 DIAGNOSIS — B3731 Acute candidiasis of vulva and vagina: Secondary | ICD-10-CM | POA: Diagnosis not present

## 2022-09-06 NOTE — Progress Notes (Signed)
E-Visit for Vaginal Symptoms  We are sorry that you are not feeling well. Here is how we plan to help! Based on what you shared with me it looks like you: May have a yeast vaginosis  Vaginosis is an inflammation of the vagina that can result in discharge, itching and pain. The cause is usually a change in the normal balance of vaginal bacteria or an infection. Vaginosis can also result from reduced estrogen levels after menopause.  The most common causes of vaginosis are:   Bacterial vaginosis which results from an overgrowth of one on several organisms that are normally present in your vagina.   Yeast infections which are caused by a naturally occurring fungus called candida.   Vaginal atrophy (atrophic vaginosis) which results from the thinning of the vagina from reduced estrogen levels after menopause.   Trichomoniasis which is caused by a parasite and is commonly transmitted by sexual intercourse.  Factors that increase your risk of developing vaginosis include: Medications, such as antibiotics and steroids Uncontrolled diabetes Use of hygiene products such as bubble bath, vaginal spray or vaginal deodorant Douching Wearing damp or tight-fitting clothing Using an intrauterine device (IUD) for birth control Hormonal changes, such as those associated with pregnancy, birth control pills or menopause Sexual activity Having a sexually transmitted infection  Your treatment plan is Monistat (miconazole) or Gyne-Lotrimin (clotrimazole) over the counter at most pharmacies.  You are allergic to some ingredients in diflucan so will have to use vaginal cream  Be sure to take all of the medication as directed. Stop taking any medication if you develop a rash, tongue swelling or shortness of breath. Mothers who are breast feeding should consider pumping and discarding their breast milk while on these antibiotics. However, there is no consensus that infant exposure at these doses would be  harmful.  Remember that medication creams can weaken latex condoms. Marland Kitchen   HOME CARE:  Good hygiene may prevent some types of vaginosis from recurring and may relieve some symptoms:  Avoid baths, hot tubs and whirlpool spas. Rinse soap from your outer genital area after a shower, and dry the area well to prevent irritation. Don't use scented or harsh soaps, such as those with deodorant or antibacterial action. Avoid irritants. These include scented tampons and pads. Wipe from front to back after using the toilet. Doing so avoids spreading fecal bacteria to your vagina.  Other things that may help prevent vaginosis include:  Don't douche. Your vagina doesn't require cleansing other than normal bathing. Repetitive douching disrupts the normal organisms that reside in the vagina and can actually increase your risk of vaginal infection. Douching won't clear up a vaginal infection. Use a latex condom. Both female and female latex condoms may help you avoid infections spread by sexual contact. Wear cotton underwear. Also wear pantyhose with a cotton crotch. If you feel comfortable without it, skip wearing underwear to bed. Yeast thrives in [employer] Your symptoms should improve in the next day or two.  GET HELP RIGHT AWAY IF:  You have pain in your lower abdomen ( pelvic area or over your ovaries) You develop nausea or vomiting You develop a fever Your discharge changes or worsens You have persistent pain with intercourse You develop shortness of breath, a rapid pulse, or you faint.  These symptoms could be signs of problems or infections that need to be evaluated by a medical provider now.  MAKE SURE YOU   Understand these instructions. Will watch your condition. Will get help  right away if you are not doing well or get worse.  Thank you for choosing an e-visit.  Your e-visit answers were reviewed by a board certified advanced clinical practitioner to complete your personal care  plan. Depending upon the condition, your plan could have included both over the counter or prescription medications.  Please review your pharmacy choice. Make sure the pharmacy is open so you can pick up prescription now. If there is a problem, you may contact your provider through [employer] and have the prescription routed to another pharmacy.  Your safety is important to [place]. If you have drug allergies check your prescription carefully.   For the next 24 hours you can use MyChart to ask questions about today's visit, request a non-urgent call back, or ask for a work or school excuse. You will get an email in the next two days asking about your experience. I hope that your e-visit has been valuable and will speed your recovery.  Mary-Margaret Hassell Done, FNP   5-10 minutes spent reviewing and documenting in chart.
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
## 2022-09-10 ENCOUNTER — Telehealth: Payer: Self-pay | Admitting: *Deleted

## 2022-09-10 ENCOUNTER — Ambulatory Visit (INDEPENDENT_AMBULATORY_CARE_PROVIDER_SITE_OTHER): Payer: 59 | Admitting: Gastroenterology

## 2022-09-10 ENCOUNTER — Encounter: Payer: Self-pay | Admitting: Gastroenterology

## 2022-09-10 VITALS — BP 111/71 | HR 66 | Temp 98.2°F | Ht 67.0 in | Wt 219.6 lb

## 2022-09-10 DIAGNOSIS — K5909 Other constipation: Secondary | ICD-10-CM

## 2022-09-10 DIAGNOSIS — Z1211 Encounter for screening for malignant neoplasm of colon: Secondary | ICD-10-CM | POA: Diagnosis not present

## 2022-09-10 DIAGNOSIS — Z800 Family history of malignant neoplasm of digestive organs: Secondary | ICD-10-CM | POA: Diagnosis not present

## 2022-09-10 NOTE — Telephone Encounter (Signed)
Pt called back and left vm   [facility]

## 2022-09-10 NOTE — Telephone Encounter (Signed)
[facility]  TCS w/Dr.Carver, ASA 3

## 2022-09-10 NOTE — Patient Instructions (Addendum)
We are scheduling you for a colonoscopy in the near future with Dr. Abbey Chatters.  You will receive separate detailed written instructions regarding your prep however you will need to hold your iron for 7 days prior to your procedure.  I would also like for you to take your Linzess only 45 mcg daily for 2 days prior to ensure good prep.  I am providing with some samples of Linzess 72 mcg, try taking this daily on empty stomach, at least 30 minutes prior to breakfast to see if this is too strong.  Please call with a progress report in 1-2 weeks.  Also would likely benefit from increase of fiber in your diet to assist with your constipation, I recommend Benefiber 2-3 teaspoons daily. You may mix this in 8 ox of fluid of your choice.   We will plan to see you about 3-4 months post colonoscopy to reassess you constipation.   It was a pleasure to see you today. I want to create trusting relationships with patients. If you receive a survey regarding your visit,  I greatly appreciate you taking time to fill this out on paper or through your MyChart. I value your feedback.  Venetia Night, MSN, FNP-BC, AGACNP-BC [facility]

## 2022-09-12 NOTE — Telephone Encounter (Signed)
Pt called back and left vm  [facility]

## 2022-09-16 ENCOUNTER — Encounter: Payer: Self-pay | Admitting: *Deleted

## 2022-09-16 ENCOUNTER — Other Ambulatory Visit: Payer: Self-pay | Admitting: *Deleted

## 2022-09-16 MED ORDER — PEG 3350-KCL-NA BICARB-NACL 420 G PO SOLR: 4000.0000 mL | Freq: Once | ORAL | 0 refills | Status: AC

## 2022-09-16 NOTE — Telephone Encounter (Signed)
Pt has been scheduled for 10/10/22. Instructions mailed and prep sent to the pharmacy.  [employer] PA:  CPT Code GECOL Description: Colonoscopy Case Number: [id number] Review Date: 09/16/2022 8:29:41 AM Expiration Date: N/A Status: This member is not in scope for prior-authorization/notification for the services requested. You can save the case reference ID as validation of your request.

## 2022-09-25 ENCOUNTER — Encounter: Payer: Self-pay | Admitting: Radiology

## 2022-09-30 ENCOUNTER — Ambulatory Visit: Payer: 59 | Admitting: Family Medicine

## 2022-09-30 ENCOUNTER — Encounter: Payer: Self-pay | Admitting: Family Medicine

## 2022-09-30 VITALS — BP 140/96 | HR 55 | Temp 98.8°F | Ht 67.0 in | Wt 216.0 lb

## 2022-09-30 DIAGNOSIS — K5909 Other constipation: Secondary | ICD-10-CM | POA: Diagnosis not present

## 2022-09-30 DIAGNOSIS — E119 Type 2 diabetes mellitus without complications: Secondary | ICD-10-CM

## 2022-09-30 DIAGNOSIS — I10 Essential (primary) hypertension: Secondary | ICD-10-CM | POA: Diagnosis not present

## 2022-09-30 DIAGNOSIS — Z7689 Persons encountering health services in other specified circumstances: Secondary | ICD-10-CM
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
## 2022-09-30 NOTE — Assessment & Plan Note (Signed)
Uncontrolled. Reports elevated home readings as well. She is not taking Coreg as prescribed, counseled on medication compliance. No red flag symptoms. Continue Losartan 12.'5mg'$  daily, Atorvastatin '20mg'$  daily, and take Coreg 12.'5mg'$  BID. Continue checking BP at home and return to office in 1 week with medications, BP log, and BP cuff. Seek medical care for BP sustained >180/100, chest pain, palpitations, lightheadedness/dizziness, vision changes, headaches, swelling of extremities. Encourage heart healthy diet and 150 minutes moderate intensity exercise weekly as tolerated.

## 2022-09-30 NOTE — Assessment & Plan Note (Signed)
Patients medical history and recent medical records were reviewed. She had a recent physical with labs completed in January. Health maintenance items up to date.

## 2022-09-30 NOTE — Assessment & Plan Note (Signed)
Well controlled. Last A1c 6.5%. No medications at this time. No BG log today. Aware of s/s of hypoglycemia and hyperglycemia.

## 2022-09-30 NOTE — Assessment & Plan Note (Addendum)
Followed by GI. Continue Linzess 68mg daily. Keep your appointment for colonoscopy. Seek medical care for worsening abdominal pain, inability to pass stool, nausea/vomiting, or worsening bleeding.

## 2022-09-30 NOTE — Patient Instructions (Signed)
It was great to meet you today and I'm excited to have you join the [facility] practice. I hope you had a positive experience today! If you feel so inclined, please feel free to recommend our practice to friends and family. Mila Merry, FNP-C

## 2022-10-01 NOTE — Addendum Note (Signed)
Addended by: Rubie Maid on: 10/01/2022 10:14 AM   Modules accepted: Level of Service

## 2022-10-06 NOTE — Patient Instructions (Signed)
Cheryl Scott  10/06/2022     '@PREFPERIOPPHARMACY'$ @   Your procedure is scheduled on  10/10/2022.   Report to [facility] at  [place].M.   Call this number if you have problems the morning of surgery:  [phone]  If you experience any cold or flu symptoms such as cough, fever, chills, shortness of breath, etc. between now and your scheduled surgery, please notify us at the above number.   Remember:  Follow the diet and prep instructions given to you by the office.     Take these medicines the morning of surgery with A SIP OF WATER                      carvedilol, zyrtec, pantoprazole,     Do not wear jewelry, make-up or nail polish.  Do not wear lotions, powders, or perfumes, or deodorant.  Do not shave 48 hours prior to surgery.  Men may shave face and neck.  Do not bring valuables to the hospital.  [facility] is not responsible for any belongings or valuables.  Contacts, dentures or bridgework may not be worn into surgery.  Leave your suitcase in the car.  After surgery it may be brought to your room.  For patients admitted to the hospital, discharge time will be determined by your treatment team.  Patients discharged the day of surgery will not be allowed to drive home and must have someone with them for 24 hours.    Special instructions:   DO NOT smoke tobacco or vape for 24 hours before your procedure.  Please read over the following fact sheets that you were given. Anesthesia Post-op Instructions and Care and Recovery After Surgery      Colonoscopy, Adult, Care After The following information offers guidance on how to care for yourself after your procedure. Your health care provider may also give you more specific instructions. If you have problems or questions, contact your health care provider. What can I expect after the procedure? After the procedure, it is common to have: A small amount of blood in your stool for 24 hours after the  procedure. Some gas. Mild cramping or bloating of your abdomen. Follow these instructions at home: Eating and drinking  Drink enough fluid to keep your urine pale yellow. Follow instructions from your health care provider about eating or drinking restrictions. Resume your normal diet as told by your health care provider. Avoid heavy or fried foods that are hard to digest. Activity Rest as told by your health care provider. Avoid sitting for a long time without moving. Get up to take short walks every 1-2 hours. This is important to improve blood flow and breathing. Ask for help if you feel weak or unsteady. Return to your normal activities as told by your health care provider. Ask your health care provider what activities are safe for you. Managing cramping and bloating  Try walking around when you have cramps or feel bloated. If directed, apply heat to your abdomen as told by your health care provider. Use the heat source that your health care provider recommends, such as a moist heat pack or a heating pad. Place a towel between your skin and the heat source. Leave the heat on for 20-30 minutes. Remove the heat if your skin turns bright red. This is especially important if you are unable to feel pain, heat, or cold. You have a greater risk of getting  burned. General instructions If you were given a sedative during the procedure, it can affect you for several hours. Do not drive or operate machinery until your health care provider says that it is safe. For the first 24 hours after the procedure: Do not sign important documents. Do not drink alcohol. Do your regular daily activities at a slower pace than normal. Eat soft foods that are easy to digest. Take over-the-counter and prescription medicines only as told by your health care provider. Keep all follow-up visits. This is important. Contact a health care provider if: You have blood in your stool 2-3 days after the procedure. Get  help right away if: You have more than a small spotting of blood in your stool. You have large blood clots in your stool. You have swelling of your abdomen. You have nausea or vomiting. You have a fever. You have increasing pain in your abdomen that is not relieved with medicine. These symptoms may be an emergency. Get help right away. Call 911. Do not wait to see if the symptoms will go away. Do not drive yourself to the hospital. Summary After the procedure, it is common to have a small amount of blood in your stool. You may also have mild cramping and bloating of your abdomen. If you were given a sedative during the procedure, it can affect you for several hours. Do not drive or operate machinery until your health care provider says that it is safe. Get help right away if you have a lot of blood in your stool, nausea or vomiting, a fever, or increased pain in your abdomen. This information is not intended to replace advice given to you by your health care provider. Make sure you discuss any questions you have with your health care provider. Document Revised: 03/06/2021 Document Reviewed: 03/06/2021 Elsevier Patient Education  [place] After The following information offers guidance on how to care for yourself after your procedure. Your health care provider may also give you more specific instructions. If you have problems or questions, contact your health care provider. What can I expect after the procedure? After the procedure, it is common to have: Tiredness. Little or no memory about what happened during or after the procedure. Impaired judgment when it comes to making decisions. Nausea or vomiting. Some trouble with balance. Follow these instructions at home: For the time period you were told by your health care provider:  Rest. Do not participate in activities where you could fall or become injured. Do not drive or use machinery. Do  not drink alcohol. Do not take sleeping pills or medicines that cause drowsiness. Do not make important decisions or sign legal documents. Do not take care of children on your own. Medicines Take over-the-counter and prescription medicines only as told by your health care provider. If you were prescribed antibiotics, take them as told by your health care provider. Do not stop using the antibiotic even if you start to feel better. Eating and drinking Follow instructions from your health care provider about what you may eat and drink. Drink enough fluid to keep your urine pale yellow. If you vomit: Drink clear fluids slowly and in small amounts as you are able. Clear fluids include water, ice chips, low-calorie sports drinks, and fruit juice that has water added to it (diluted fruit juice). Eat light and bland foods in small amounts as you are able. These foods include bananas, applesauce, rice, lean meats, toast, and crackers. General  instructions  Have a responsible adult stay with you for the time you are told. It is important to have someone help care for you until you are awake and alert. If you have sleep apnea, surgery and some medicines can increase your risk for breathing problems. Follow instructions from your health care provider about wearing your sleep device: When you are sleeping. This includes during daytime naps. While taking prescription pain medicines, sleeping medicines, or medicines that make you drowsy. Do not use any products that contain nicotine or tobacco. These products include cigarettes, chewing tobacco, and vaping devices, such as e-cigarettes. If you need help quitting, ask your health care provider. Contact a health care provider if: You feel nauseous or vomit every time you eat or drink. You feel light-headed. You are still sleepy or having trouble with balance after 24 hours. You get a rash. You have a fever. You have redness or swelling around the IV  site. Get help right away if: You have trouble breathing. You have new confusion after you get home. These symptoms may be an emergency. Get help right away. Call 911. Do not wait to see if the symptoms will go away. Do not drive yourself to the hospital. This information is not intended to replace advice given to you by your health care provider. Make sure you discuss any questions you have with your health care provider. Document Revised: 12/09/2021 Document Reviewed: 12/09/2021 Elsevier Patient Education  [place].

## 2022-10-08 ENCOUNTER — Encounter (HOSPITAL_COMMUNITY)
Admission: RE | Admit: 2022-10-08 | Discharge: 2022-10-08 | Disposition: A | Discharge status: Home / Self Care | Payer: 59 | Source: Ambulatory Visit | Attending: Internal Medicine | Admitting: Internal Medicine

## 2022-10-08 ENCOUNTER — Encounter (HOSPITAL_COMMUNITY): Payer: Self-pay

## 2022-10-08 ENCOUNTER — Other Ambulatory Visit: Payer: Self-pay

## 2022-10-08 VITALS — BP 159/91 | HR 55 | Temp 98.7°F | Resp 18 | Ht 67.0 in | Wt 216.0 lb

## 2022-10-08 DIAGNOSIS — I10 Essential (primary) hypertension: Secondary | ICD-10-CM | POA: Diagnosis not present

## 2022-10-08 DIAGNOSIS — E119 Type 2 diabetes mellitus without complications: Secondary | ICD-10-CM | POA: Insufficient documentation

## 2022-10-08 DIAGNOSIS — N184 Chronic kidney disease, stage 4 (severe): Secondary | ICD-10-CM | POA: Insufficient documentation

## 2022-10-08 DIAGNOSIS — Z01818 Encounter for other preprocedural examination: Secondary | ICD-10-CM | POA: Diagnosis present

## 2022-10-08 LAB — BASIC METABOLIC PANEL
Anion gap: 11 (ref 5–15)
BUN: 19 mg/dL (ref 6–20)
CO2: 26 mmol/L (ref 22–32)
Calcium: 8.9 mg/dL (ref 8.9–10.3)
Chloride: 103 mmol/L (ref 98–111)
Creatinine, Ser: 2.11 mg/dL — ABNORMAL HIGH (ref 0.44–1.00)
GFR, Estimated: 27 mL/min — ABNORMAL LOW (ref >60)
Glucose, Bld: 121 mg/dL — ABNORMAL HIGH (ref 70–99)
Potassium: 3.9 mmol/L (ref 3.5–5.1)
Sodium: 140 mmol/L (ref 135–145)

## 2022-10-10 ENCOUNTER — Ambulatory Visit (HOSPITAL_COMMUNITY): Payer: 59 | Admitting: Anesthesiology

## 2022-10-10 ENCOUNTER — Ambulatory Visit (HOSPITAL_BASED_OUTPATIENT_CLINIC_OR_DEPARTMENT_OTHER): Payer: 59 | Admitting: Anesthesiology

## 2022-10-10 ENCOUNTER — Encounter (HOSPITAL_COMMUNITY)
Admission: RE | Disposition: A | Discharge status: Home / Self Care | Payer: Self-pay | Source: Home / Self Care | Attending: Internal Medicine

## 2022-10-10 ENCOUNTER — Ambulatory Visit (HOSPITAL_COMMUNITY)
Admission: RE | Admit: 2022-10-10 | Discharge: 2022-10-10 | Disposition: A | Discharge status: Home / Self Care | Payer: 59 | Attending: Internal Medicine | Admitting: Internal Medicine

## 2022-10-10 ENCOUNTER — Encounter (HOSPITAL_COMMUNITY): Payer: Self-pay

## 2022-10-10 DIAGNOSIS — K219 Gastro-esophageal reflux disease without esophagitis: Secondary | ICD-10-CM | POA: Diagnosis not present

## 2022-10-10 DIAGNOSIS — D122 Benign neoplasm of ascending colon: Secondary | ICD-10-CM | POA: Diagnosis not present

## 2022-10-10 DIAGNOSIS — E119 Type 2 diabetes mellitus without complications: Secondary | ICD-10-CM | POA: Insufficient documentation

## 2022-10-10 DIAGNOSIS — Z1211 Encounter for screening for malignant neoplasm of colon: Secondary | ICD-10-CM | POA: Diagnosis present

## 2022-10-10 DIAGNOSIS — K5730 Diverticulosis of large intestine without perforation or abscess without bleeding: Secondary | ICD-10-CM

## 2022-10-10 DIAGNOSIS — Z87891 Personal history of nicotine dependence: Secondary | ICD-10-CM | POA: Diagnosis not present

## 2022-10-10 DIAGNOSIS — K648 Other hemorrhoids: Secondary | ICD-10-CM

## 2022-10-10 DIAGNOSIS — D631 Anemia in chronic kidney disease: Secondary | ICD-10-CM | POA: Diagnosis not present

## 2022-10-10 DIAGNOSIS — D630 Anemia in neoplastic disease: Secondary | ICD-10-CM

## 2022-10-10 DIAGNOSIS — N189 Chronic kidney disease, unspecified: Secondary | ICD-10-CM | POA: Diagnosis not present

## 2022-10-10 DIAGNOSIS — D123 Benign neoplasm of transverse colon: Secondary | ICD-10-CM | POA: Insufficient documentation

## 2022-10-10 DIAGNOSIS — D125 Benign neoplasm of sigmoid colon: Secondary | ICD-10-CM

## 2022-10-10 DIAGNOSIS — Z800 Family history of malignant neoplasm of digestive organs: Secondary | ICD-10-CM | POA: Insufficient documentation

## 2022-10-10 DIAGNOSIS — I129 Hypertensive chronic kidney disease with stage 1 through stage 4 chronic kidney disease, or unspecified chronic kidney disease: Secondary | ICD-10-CM | POA: Insufficient documentation

## 2022-10-10 DIAGNOSIS — F32A Depression, unspecified: Secondary | ICD-10-CM | POA: Insufficient documentation

## 2022-10-10 DIAGNOSIS — F419 Anxiety disorder, unspecified: Secondary | ICD-10-CM | POA: Diagnosis not present

## 2022-10-10 DIAGNOSIS — G4730 Sleep apnea, unspecified: Secondary | ICD-10-CM

## 2022-10-10 HISTORY — PX: COLONOSCOPY WITH PROPOFOL: SHX5780

## 2022-10-10 HISTORY — PX: SUBMUCOSAL TATTOO INJECTION: SHX6856

## 2022-10-10 HISTORY — PX: BIOPSY: SHX5522

## 2022-10-10 HISTORY — PX: POLYPECTOMY: SHX149

## 2022-10-10 LAB — GLUCOSE, CAPILLARY: Glucose-Capillary: 105 mg/dL — ABNORMAL HIGH (ref 70–99)

## 2022-10-10 SURGERY — COLONOSCOPY WITH PROPOFOL
Anesthesia: General

## 2022-10-10 MED ORDER — PROPOFOL 500 MG/50ML IV EMUL
INTRAVENOUS | Status: DC | PRN
  Administered 2022-10-10: 150 ug/kg/min via INTRAVENOUS

## 2022-10-10 MED ORDER — SPOT INK MARKER SYRINGE KIT
PACK | SUBMUCOSAL | Status: DC | PRN
  Administered 2022-10-10: .2 mL via SUBMUCOSAL

## 2022-10-10 MED ORDER — PROPOFOL 500 MG/50ML IV EMUL
INTRAVENOUS | Status: AC
  Filled 2022-10-10: qty 50

## 2022-10-10 MED ORDER — SPOT INK MARKER SYRINGE KIT
PACK | SUBMUCOSAL | Status: AC
  Filled 2022-10-10: qty 5

## 2022-10-10 MED ORDER — LACTATED RINGERS IV SOLN: INTRAVENOUS | Status: DC

## 2022-10-10 MED ORDER — PROPOFOL 10 MG/ML IV BOLUS
INTRAVENOUS | Status: DC | PRN
  Administered 2022-10-10: 60 mg via INTRAVENOUS

## 2022-10-10 NOTE — Discharge Instructions (Addendum)
  Colonoscopy Discharge Instructions  Read the instructions outlined below and refer to this sheet in the next few weeks. These discharge instructions provide you with general information on caring for yourself after you leave the hospital. Your doctor may also give you specific instructions. While your treatment has been planned according to the most current medical practices available, unavoidable complications occasionally occur.   ACTIVITY You may resume your regular activity, but move at a slower pace for the next 24 hours.  Take frequent rest periods for the next 24 hours.  Walking will help get rid of the air and reduce the bloated feeling in your belly (abdomen).  No driving for 24 hours (because of the medicine (anesthesia) used during the test).   Do not sign any important legal documents or operate any machinery for 24 hours (because of the anesthesia used during the test).  NUTRITION Drink plenty of fluids.  You may resume your normal diet as instructed by your doctor.  Begin with a light meal and progress to your normal diet. Heavy or fried foods are harder to digest and may make you feel sick to your stomach (nauseated).  Avoid alcoholic beverages for 24 hours or as instructed.  MEDICATIONS You may resume your normal medications unless your doctor tells you otherwise.  WHAT YOU CAN EXPECT TODAY Some feelings of bloating in the abdomen.  Passage of more gas than usual.  Spotting of blood in your stool or on the toilet paper.  IF YOU HAD POLYPS REMOVED DURING THE COLONOSCOPY: No aspirin products for 7 days or as instructed.  No alcohol for 7 days or as instructed.  Eat a soft diet for the next 24 hours.  FINDING OUT THE RESULTS OF YOUR TEST Not all test results are available during your visit. If your test results are not back during the visit, make an appointment with your caregiver to find out the results. Do not assume everything is normal if you have not heard from your  caregiver or the medical facility. It is important for you to follow up on all of your test results.  SEEK IMMEDIATE MEDICAL ATTENTION IF: You have more than a spotting of blood in your stool.  Your belly is swollen (abdominal distention).  You are nauseated or vomiting.  You have a temperature over 101.  You have abdominal pain or discomfort that is severe or gets worse throughout the day.   Your colonoscopy revealed 5 polyps.  For smaller ones which I removed successfully.  You had 1 large polyp on the left side of your colon which was actively oozing blood.  This is at least an advanced polyp and possibly colon cancer.  I removed it today.  Await pathology results, I will contact you next week,  May need surgical consultation.  May need further imaging with CT scans and possible oncology referral.  I will call you next week to discuss further once I have pathology results.   Elon Alas. Abbey Chatters, D.O. Gastroenterology and Hepatology [facility]

## 2022-10-10 NOTE — Anesthesia Preprocedure Evaluation (Signed)
Anesthesia Evaluation  Patient identified by MRN, date of birth, ID band Patient awake    Reviewed: Allergy & Precautions, H&P , NPO status , Patient's Chart, lab work & pertinent test results, reviewed documented beta blocker date and time   Airway Mallampati: II  TM Distance: >3 FB Neck ROM: full    Dental no notable dental hx.    Pulmonary sleep apnea , former smoker   Pulmonary exam normal breath sounds clear to auscultation       Cardiovascular Exercise Tolerance: Good hypertension, negative cardio ROS  Rhythm:regular Rate:Normal     Neuro/Psych  PSYCHIATRIC DISORDERS Anxiety Depression     Neuromuscular disease    GI/Hepatic Neg liver ROS, PUD,GERD  Medicated,,  Endo/Other  diabetes, Type 2    Renal/GU CRFRenal disease  negative genitourinary   Musculoskeletal   Abdominal   Peds  Hematology  (+) Blood dyscrasia, anemia   Anesthesia Other Findings   Reproductive/Obstetrics negative OB ROS                             Anesthesia Physical Anesthesia Plan  ASA: 3  Anesthesia Plan: General   Post-op Pain Management:    Induction:   PONV Risk Score and Plan: Propofol infusion  Airway Management Planned:   Additional Equipment:   Intra-op Plan:   Post-operative Plan:   Informed Consent: I have reviewed the patients History and Physical, chart, labs and discussed the procedure including the risks, benefits and alternatives for the proposed anesthesia with the patient or authorized representative who has indicated his/her understanding and acceptance.     Dental Advisory Given  Plan Discussed with: CRNA  Anesthesia Plan Comments:        Anesthesia Quick Evaluation

## 2022-10-10 NOTE — Transfer of Care (Signed)
Immediate Anesthesia Transfer of Care Note  Patient: Cheryl Scott  Procedure(s) Performed: COLONOSCOPY WITH PROPOFOL POLYPECTOMY INTESTINAL BIOPSY SUBMUCOSAL TATTOO INJECTION  Patient Location: PACU  Anesthesia Type:General  Level of Consciousness: drowsy, patient cooperative, and responds to stimulation  Airway & Oxygen Therapy: Patient Spontanous Breathing  Post-op Assessment: Report given to RN, Post -op Vital signs reviewed and stable, Patient moving all extremities X 4, and Patient able to stick tongue midline  Post vital signs: Reviewed  Last Vitals:  Vitals Value Taken Time  BP 90/47   Temp 97.8   Pulse 63   Resp 27   SpO2 93     Last Pain:  Vitals:   10/10/22 0907  TempSrc:   PainSc: 0-No pain         Complications: No notable events documented.

## 2022-10-10 NOTE — Op Note (Signed)
[facility] Patient Name: Cheryl Scott Procedure Date: 10/10/2022 8:48 AM MRN: [id number] Date of Birth: [birth year] Attending MD: Elon Alas. Abbey Chatters , [place], [id number] CSN: [id number] Age: 52 Admit Type: Outpatient Procedure:                Colonoscopy Indications:              Colon cancer screening in patient at increased                            risk: Colorectal cancer in sister Providers:                Elon Alas. Abbey Chatters, DO, Crystal Page, Aram Candela Referring MD:              Medicines:                See the Anesthesia note for documentation of the                            administered medications Complications:            No immediate complications. Estimated Blood Loss:     Estimated blood loss was minimal. Procedure:                Pre-Anesthesia Assessment:                           - The anesthesia plan was to use monitored                            anesthesia care (MAC).                           After obtaining informed consent, the colonoscope                            was passed under direct vision. Throughout the                            procedure, the patient's blood pressure, pulse, and                            oxygen saturations were monitored continuously. The                            [id number]-[id number]) scope was introduced through                            the anus and advanced to the the cecum, identified                            by appendiceal orifice and ileocecal valve. The                            colonoscopy was performed without difficulty. The  patient tolerated the procedure well. The quality                            of the bowel preparation was evaluated using the                            BBPS [facility] Bowel Preparation Scale) with scores                            of: Right Colon = 2 (minor amount of residual                            staining, small fragments of stool and/or opaque                             liquid, but mucosa seen well), Transverse Colon = 2                            (minor amount of residual staining, small fragments                            of stool and/or opaque liquid, but mucosa seen                            well) and Left Colon = 2 (minor amount of residual                            staining, small fragments of stool and/or opaque                            liquid, but mucosa seen well). The total BBPS score                            equals 6. Fair. Scope In: 9:11:35 AM Scope Out: 9:49:10 AM Scope Withdrawal Time: 0 hours 29 minutes 49 seconds  Total Procedure Duration: 0 hours 37 minutes 35 seconds  Findings:      Non-bleeding internal hemorrhoids were found during endoscopy.      Many large-mouthed and small-mouthed diverticula were found in the       sigmoid colon, descending colon and transverse colon.      Three sessile polyps were found in the ascending colon. The polyps were       5 to 10 mm in size. These polyps were removed with a cold snare.       Resection and retrieval were complete.      A 6 mm polyp was found in the transverse colon. The polyp was sessile.       The polyp was removed with a cold snare. Resection and retrieval were       complete.      A 20-25 mm advanced/possible malignant polyp was found in the sigmoid       colon approx 25 cm from anal verge. The polyp was pedunculated,       ulcerated, polypoid, actively oozing blood. The polyp was removed with a  hot snare. Further resection of stalk was then achieved with hot snare.       Resection and retrieval were complete. Area distal was tattooed with an       injection of 1 mL of Spot (carbon black). Control of bleeding successful       with polypectomy itself. Impression:               - Non-bleeding internal hemorrhoids.                           - Diverticulosis in the sigmoid colon, in the                            descending colon and in the transverse colon.                            - Three 5 to 10 mm polyps in the ascending colon,                            removed with a cold snare. Resected and retrieved.                           - One 6 mm polyp in the transverse colon, removed                            with a cold snare. Resected and retrieved.                           - One 20-25 mm polyp in the sigmoid colon, removed                            with a hot snare. Resected and retrieved. Tattooed. Moderate Sedation:      Per Anesthesia Care Recommendation:           - Patient has a contact number available for                            emergencies. The signs and symptoms of potential                            delayed complications were discussed with the                            patient. Return to normal activities tomorrow.                            Written discharge instructions were provided to the                            patient.                           - Resume previous diet.                           -  Continue present medications.                           - Await pathology results.                           - Repeat colonoscopy date to be determined after                            pending pathology results are reviewed for                            surveillance.                           - Sigmoid polyp advanced, possibly malignant. If                            invasion into stock, will need surgical evaluation.                            If malignant, will need CT chest abd pelvis as well                            as oncology referral. Procedure Code(s):        --- Professional ---                           [phone], Colonoscopy, flexible; with removal of                            tumor(s), polyp(s), or other lesion(s) by snare                            technique                           45381, Colonoscopy, flexible; with directed                            submucosal injection(s), any substance Diagnosis Code(s):         --- Professional ---                           Z80.0, Family history of malignant neoplasm of                            digestive organs                           K64.8, Other hemorrhoids                           D12.2, Benign neoplasm of ascending colon                           D12.3, Benign neoplasm of transverse colon (hepatic  flexure or splenic flexure)                           D12.5, Benign neoplasm of sigmoid colon                           K57.30, Diverticulosis of large intestine without                            perforation or abscess without bleeding CPT copyright 2022 American Medical Association. All rights reserved. The codes documented in this report are preliminary and upon coder review may  be revised to meet current compliance requirements. Elon Alas. Abbey Chatters, DO [place] Abbey Chatters, DO 10/10/2022 9:59:09 AM This report has been signed electronically. Number of Addenda: 0
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
## 2022-10-11 NOTE — Anesthesia Postprocedure Evaluation (Signed)
Anesthesia Post Note  Patient: AMBERA CONK  Procedure(s) Performed: COLONOSCOPY WITH PROPOFOL POLYPECTOMY INTESTINAL BIOPSY SUBMUCOSAL TATTOO INJECTION  Patient location during evaluation: Phase II Anesthesia Type: General Level of consciousness: awake Pain management: pain level controlled Vital Signs Assessment: post-procedure vital signs reviewed and stable Respiratory status: spontaneous breathing and respiratory function stable Cardiovascular status: blood pressure returned to baseline and stable Postop Assessment: no headache and no apparent nausea or vomiting Anesthetic complications: no Comments: Late entry   No notable events documented.   Last Vitals:  Vitals:   10/10/22 1001 10/10/22 1027  BP: (!) 133/101 (!) 90/55  Pulse:    Resp:    Temp:    SpO2:      Last Pain:  Vitals:   10/10/22 1001  TempSrc:   PainSc: 0-No pain                 Louann Sjogren

## 2022-10-15 LAB — SURGICAL PATHOLOGY

## 2022-10-21 ENCOUNTER — Encounter (HOSPITAL_COMMUNITY): Payer: Self-pay | Admitting: Internal Medicine

## 2022-10-22 ENCOUNTER — Encounter: Payer: Self-pay | Admitting: Family Medicine

## 2022-10-22 ENCOUNTER — Ambulatory Visit: Payer: 59 | Admitting: Family Medicine

## 2022-10-22 VITALS — BP 118/64 | HR 61 | Temp 98.2°F | Ht 67.0 in | Wt 215.0 lb

## 2022-10-22 DIAGNOSIS — N898 Other specified noninflammatory disorders of vagina: Secondary | ICD-10-CM | POA: Diagnosis not present

## 2022-10-22 DIAGNOSIS — B3731 Acute candidiasis of vulva and vagina: Secondary | ICD-10-CM | POA: Diagnosis not present

## 2022-10-22 LAB — URINALYSIS, ROUTINE W REFLEX MICROSCOPIC
Bilirubin Urine: NEGATIVE
Glucose, UA: NEGATIVE
Hyaline Cast: NONE SEEN /LPF
Ketones, ur: NEGATIVE
Leukocytes,Ua: NEGATIVE
Nitrite: NEGATIVE
Specific Gravity, Urine: 1.015 (ref 1.001–1.035)
WBC, UA: NONE SEEN /HPF (ref 0–5)
pH: 5.5 (ref 5.0–8.0)

## 2022-10-22 LAB — WET PREP FOR TRICH, YEAST, CLUE

## 2022-10-22 LAB — MICROSCOPIC MESSAGE

## 2022-10-22 MED ORDER — FLUCONAZOLE 150 MG PO TABS: 150.0000 mg | ORAL_TABLET | Freq: Once | ORAL | 0 refills | Status: DC

## 2022-10-22 NOTE — Assessment & Plan Note (Signed)
Yeast present on wet prep. Patient has history of yeast infections after antibiotics and has been treated with Fluconazole in past. No allergy to Fluconazole per patient. Instructed to wear cotton panties, keep area clean and dry, may use OTC symptom relief. Return to office if symptoms persist or worsen,
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
## 2022-10-25 ENCOUNTER — Other Ambulatory Visit: Payer: Self-pay | Admitting: Family Medicine

## 2022-11-11 ENCOUNTER — Telehealth: Payer: Self-pay

## 2022-11-11 NOTE — Telephone Encounter (Signed)
Pt called in wanting to know about results of urine sample. Pt states that she has been experiencing some pain when urinating as well as a frequency in urination. Pt would like a cb from nurse. Please advise.  Cb#: [phone]

## 2022-11-12 ENCOUNTER — Other Ambulatory Visit: Payer: Self-pay | Admitting: Family Medicine

## 2022-11-12 MED ORDER — FOSFOMYCIN TROMETHAMINE 3 G PO PACK: 3.0000 g | PACK | Freq: Once | ORAL | 0 refills | Status: AC

## 2022-11-12 NOTE — Telephone Encounter (Signed)
Pt returned call, advice antibiotics sent to pharmacy and FNP recommendations pt voiced understanding.

## 2022-11-12 NOTE — Telephone Encounter (Signed)
Tried calling pt this afternoon re msg. LVM for pt to call back.

## 2022-11-18 ENCOUNTER — Ambulatory Visit
Admission: EM | Admit: 2022-11-18 | Discharge: 2022-11-18 | Disposition: A | Discharge status: Home / Self Care | Payer: 59 | Attending: Nurse Practitioner | Admitting: Nurse Practitioner

## 2022-11-18 ENCOUNTER — Encounter: Payer: Self-pay | Admitting: Family Medicine

## 2022-11-18 DIAGNOSIS — N3001 Acute cystitis with hematuria: Secondary | ICD-10-CM | POA: Insufficient documentation

## 2022-11-18 LAB — POCT URINALYSIS DIP (MANUAL ENTRY)
Bilirubin, UA: NEGATIVE
Glucose, UA: NEGATIVE mg/dL
Ketones, POC UA: NEGATIVE mg/dL
Nitrite, UA: POSITIVE — AB
Protein Ur, POC: >=300 mg/dL — AB
Spec Grav, UA: 1.025 (ref 1.010–1.025)
Urobilinogen, UA: 1 E.U./dL
pH, UA: 6 (ref 5.0–8.0)

## 2022-11-18 MED ORDER — FLUCONAZOLE 150 MG PO TABS: 150.0000 mg | ORAL_TABLET | Freq: Once | ORAL | 0 refills | Status: AC

## 2022-11-18 MED ORDER — FOSFOMYCIN TROMETHAMINE 3 G PO PACK: 3.0000 g | PACK | ORAL | 0 refills | Status: AC
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
## 2022-11-18 NOTE — ED Triage Notes (Signed)
Pt c/o uti sx's urgency, frequency, burning, pressure, blood in urine, x 1 week primary care sent in rx for fosfomycin pt states it helped alleviate some of the symptoms

## 2022-11-18 NOTE — Discharge Instructions (Addendum)
The urine sample does show a UTI today.  Please take the fosfomycin as prescribed.  The urine culture is pending-we will call you if we need to change the antibiotic.  Make sure you are drinking plenty of water.  Recommend follow-up with OB/GYN and/or primary care provider for persistent or worsening symptoms despite treatment.  You develop signs of yeast infection at the end of treatment, you can go and take the fluconazole.

## 2022-11-20 ENCOUNTER — Encounter: Payer: Self-pay | Admitting: Family Medicine

## 2022-11-20 ENCOUNTER — Ambulatory Visit: Payer: 59 | Admitting: Family Medicine

## 2022-11-20 ENCOUNTER — Other Ambulatory Visit: Payer: Self-pay

## 2022-11-20 VITALS — BP 128/92 | HR 74 | Temp 98.5°F | Ht 67.0 in | Wt 209.0 lb

## 2022-11-20 DIAGNOSIS — N3001 Acute cystitis with hematuria: Secondary | ICD-10-CM

## 2022-11-20 DIAGNOSIS — N390 Urinary tract infection, site not specified: Secondary | ICD-10-CM | POA: Insufficient documentation

## 2022-11-20 LAB — URINALYSIS, ROUTINE W REFLEX MICROSCOPIC
Bilirubin Urine: NEGATIVE
Glucose, UA: NEGATIVE
Hyaline Cast: NONE SEEN /LPF
Ketones, ur: NEGATIVE
Nitrite: NEGATIVE
Specific Gravity, Urine: 1.025 (ref 1.001–1.035)
pH: 5.5 (ref 5.0–8.0)

## 2022-11-20 LAB — URINE CULTURE: Culture: 80000 — AB

## 2022-11-20 LAB — MICROSCOPIC MESSAGE

## 2022-11-20 NOTE — Progress Notes (Signed)
   Acute Office Visit  Subjective:     Patient ID: Cheryl Scott, female    DOB: 04/28/1971, 52 y.o.   MRN: [id number]  Chief Complaint  Patient presents with   Follow-up    I'm experiencing some feces coming through either my vagina or urethra area when I go to pee and f/u ED appt 11/18/22    HPI Patient is in today for complaints of fecal material coming through her vagina or urethra and persistent dysuria and lower abdominal pain. She has been seen in my office as well as ED for complaints of vaginal irritation in the last month. She was originally treated with Diflucan for yeast infection confirmed on wet prep. Her symptoms continued so she was given Fosfomycin for UTI.  She was retreated for UTI in ED with Fosfomycin and culture showed klebsiella pneumoniae 2 days ago. She was directed to take fosfomycin q72h if symptoms persisted and she has taken the second dose today and seen some improvement in symptoms. Denies fever, chills, body aches, flank pain, hematuria.  Review of Systems  All other systems reviewed and are negative.       Objective:    BP (!) 128/92   Pulse 74   Temp 98.5 F (36.9 C) (Oral)   Ht  (1.702 m)   Wt 209 lb (94.8 kg)   LMP 04/30/2021 Comment: tubal ligation  SpO2 98%   BMI 32.73 kg/m    Physical Exam Vitals and nursing note reviewed.  Constitutional:      Appearance: Normal appearance. She is normal weight.  HENT:     Head: Normocephalic and atraumatic.  Skin:    General: Skin is warm and dry.  Neurological:     General: No focal deficit present.     Mental Status: She is alert and oriented to person, place, and time. Mental status is at baseline.  Psychiatric:        Mood and Affect: Mood normal.        Behavior: Behavior normal.        Thought Content: Thought content normal.        Judgment: Judgment normal.     No results found for any visits on 11/20/22.      Assessment & Plan:   Problem List Items Addressed This  Visit     Acute cystitis with hematuria - Primary    No concerning findings on physical exam today. She is currently taking Fosfocymin q72h treating klebsiella pneumoniae on culture from urgent care. UA remains positive today. CBC was drawn yesterday, no results to view. Symptoms are improving. If symptoms persist will order cystoscopy and urology referral. Instructed to return to office if symptoms persist or worsen.       No orders of the defined types were placed in this encounter.   Return if symptoms worsen or fail to improve.  Park Meo, FNP

## 2022-11-20 NOTE — Assessment & Plan Note (Signed)
No concerning findings on physical exam today. She is currently taking Fosfocymin q72h treating klebsiella pneumoniae on culture from urgent care. UA remains positive today. CBC was drawn yesterday, no results to view. Symptoms are improving. If symptoms persist will order cystoscopy and urology referral. Instructed to return to office if symptoms persist or worsen.

## 2022-11-20 NOTE — Addendum Note (Signed)
Addended by: Arta Silence on: 11/20/2022 03:57 PM   Modules accepted: Orders

## 2022-11-26 ENCOUNTER — Other Ambulatory Visit: Payer: Self-pay

## 2022-11-26 DIAGNOSIS — D500 Iron deficiency anemia secondary to blood loss (chronic): Secondary | ICD-10-CM

## 2022-11-26 DIAGNOSIS — D509 Iron deficiency anemia, unspecified: Secondary | ICD-10-CM | POA: Insufficient documentation

## 2022-12-02 ENCOUNTER — Other Ambulatory Visit: Payer: Self-pay | Admitting: Nurse Practitioner

## 2022-12-03 ENCOUNTER — Other Ambulatory Visit: Payer: Self-pay | Admitting: Pharmacy Technician

## 2022-12-03 ENCOUNTER — Telehealth: Payer: Self-pay | Admitting: Pharmacy Technician

## 2022-12-03 NOTE — Telephone Encounter (Addendum)
Auth Submission: NO AUTH NEEDED Site of care: [facility] Payer: [employer] Medication & CPT/J Code(s) submitted: Venofer (Iron Sucrose) [id number] Route of submission (phone, fax, portal):  Phone # Fax # Auth type: Buy/Bill Units/visits requested: 5 Reference number:  Approval from: 12/03/22 to 04/05/23   Verbal order: Nolberto Hanlon Phone: [phone] Fax: [phone]

## 2022-12-03 NOTE — Telephone Encounter (Signed)
Requested medication (s) are due for refill today: yes  Requested medication (s) are on the active medication list: no  Last refill:  11/18/22 x 1 dose RX ended 11/18/22  Future visit scheduled: no- has upcoming visit with another provider at a different practice.  Notes to clinic:  med not assigned to a protocol- RX ended   Requested Prescriptions  Pending Prescriptions Disp Refills   fluconazole (DIFLUCAN) 150 MG tablet [Pharmacy Med Name: FLUCONAZOLE 150 MG TABLET] 1 tablet 0    Sig: Take 1 tablet (150 mg total) by mouth once for 1 dose.     Off-Protocol Failed - 12/02/2022  8:57 PM      Failed - Medication not assigned to a protocol, review manually.      Failed - Valid encounter within last 12 months    Recent Outpatient Visits           1 year ago Controlled type 2 diabetes mellitus without complication, without long-term current use of insulin (HCC)   [facility] Valentino Nose, NP   2 years ago Stage 3a chronic kidney disease (HCC)   [facility] Valentino Nose, NP   2 years ago Acute cystitis without hematuria   [facility] [place], Velna Hatchet, MD   2 years ago Routine general medical examination at a health care facility   [facility] [place], Velna Hatchet, MD   2 years ago Essential hypertension   [facility] [place], Velna Hatchet, MD       Future Appointments             In 1 month Rakes, Doralee Albino, FNP [place] [facility]

## 2022-12-08 ENCOUNTER — Other Ambulatory Visit: Payer: Self-pay

## 2022-12-08 ENCOUNTER — Telehealth (HOSPITAL_COMMUNITY): Payer: Self-pay

## 2022-12-08 NOTE — Telephone Encounter (Signed)
Called patient to schedule appointments for iron infusions at [facility]. No answer. Left HIPAA-compliant voicemail requesting call back. Will also send MyChart message.  Wyvonne Lenz, RN

## 2022-12-09 ENCOUNTER — Encounter (HOSPITAL_COMMUNITY): Payer: 59

## 2022-12-11 ENCOUNTER — Encounter (HOSPITAL_COMMUNITY)
Admission: RE | Admit: 2022-12-11 | Discharge: 2022-12-11 | Disposition: A | Discharge status: Home / Self Care | Payer: 59 | Source: Ambulatory Visit | Attending: Nephrology | Admitting: Nephrology

## 2022-12-11 VITALS — BP 121/71 | HR 52 | Temp 98.5°F | Resp 20

## 2022-12-11 DIAGNOSIS — D500 Iron deficiency anemia secondary to blood loss (chronic): Secondary | ICD-10-CM

## 2022-12-11 DIAGNOSIS — D631 Anemia in chronic kidney disease: Secondary | ICD-10-CM | POA: Insufficient documentation

## 2022-12-11 DIAGNOSIS — N184 Chronic kidney disease, stage 4 (severe): Secondary | ICD-10-CM | POA: Diagnosis not present

## 2022-12-11 MED ORDER — SODIUM CHLORIDE 0.9 % IV SOLN
200.0000 mg | Freq: Once | INTRAVENOUS | Status: AC
  Administered 2022-12-11: 200 mg via INTRAVENOUS
  Filled 2022-12-11: qty 200

## 2022-12-11 MED ORDER — DIPHENHYDRAMINE HCL 25 MG PO CAPS
25.0000 mg | ORAL_CAPSULE | Freq: Once | ORAL | Status: AC
  Administered 2022-12-11: 25 mg via ORAL
  Filled 2022-12-11: qty 1

## 2022-12-11 MED ORDER — ACETAMINOPHEN 325 MG PO TABS
650.0000 mg | ORAL_TABLET | Freq: Once | ORAL | Status: AC
  Administered 2022-12-11: 650 mg via ORAL
  Filled 2022-12-11: qty 2

## 2022-12-11 NOTE — Addendum Note (Signed)
Encounter addended by: Ardyn Forge E, RN on: 12/11/2022 10:54 AM  Actions taken: Therapy plan modified

## 2022-12-11 NOTE — Progress Notes (Signed)
Diagnosis: Iron Deficiency Anemia  Provider:  Celso Amy MD  Procedure: IV Infusion  IV Type: Peripheral, IV Location: L Antecubital  Venofer (Iron Sucrose), Dose: 200 mg  Infusion Start Time: 0949  Infusion Stop Time: 1007  Post Infusion IV Care: Observation period completed and Peripheral IV Discontinued  Discharge: Condition: Stable, Destination: Home . AVS Provided  Performed by:  Wyvonne Lenz, RN

## 2022-12-16 ENCOUNTER — Encounter (HOSPITAL_COMMUNITY)
Admission: RE | Admit: 2022-12-16 | Discharge: 2022-12-16 | Disposition: A | Discharge status: Home / Self Care | Payer: 59 | Source: Ambulatory Visit | Attending: Nephrology | Admitting: Nephrology

## 2022-12-16 VITALS — BP 150/86 | HR 53 | Temp 98.5°F | Resp 18

## 2022-12-16 DIAGNOSIS — N184 Chronic kidney disease, stage 4 (severe): Secondary | ICD-10-CM

## 2022-12-16 DIAGNOSIS — N189 Chronic kidney disease, unspecified: Secondary | ICD-10-CM

## 2022-12-16 DIAGNOSIS — D500 Iron deficiency anemia secondary to blood loss (chronic): Secondary | ICD-10-CM

## 2022-12-16 MED ORDER — DIPHENHYDRAMINE HCL 25 MG PO CAPS
25.0000 mg | ORAL_CAPSULE | Freq: Once | ORAL | Status: AC
  Administered 2022-12-16: 25 mg via ORAL

## 2022-12-16 MED ORDER — ACETAMINOPHEN 325 MG PO TABS
650.0000 mg | ORAL_TABLET | Freq: Once | ORAL | Status: AC
  Administered 2022-12-16: 650 mg via ORAL

## 2022-12-16 MED ORDER — SODIUM CHLORIDE 0.9 % IV SOLN
200.0000 mg | Freq: Once | INTRAVENOUS | Status: AC
  Administered 2022-12-16: 200 mg via INTRAVENOUS
  Filled 2022-12-16: qty 200

## 2022-12-16 NOTE — Progress Notes (Signed)
Diagnosis: Iron Deficiency Anemia  Provider:  Celso Amy MD  Procedure: IV Infusion  IV Type: Peripheral, IV Location: R Antecubital  Venofer (Iron Sucrose), Dose: 200 mg  Infusion Start Time: 0945  Infusion Stop Time: 1008  Post Infusion IV Care: Observation declined and peripheral IV discontinued.  Discharge: Condition: Stable, Destination: Home . AVS declined.  Performed by:  Wyvonne Lenz, RN

## 2022-12-16 NOTE — Addendum Note (Signed)
Encounter addended by: Wyvonne Lenz, RN on: 12/16/2022 3:22 PM  Actions taken: Therapy plan modified

## 2022-12-18 ENCOUNTER — Encounter (HOSPITAL_COMMUNITY)
Admission: RE | Admit: 2022-12-18 | Discharge: 2022-12-18 | Disposition: A | Discharge status: Home / Self Care | Payer: 59 | Source: Ambulatory Visit | Attending: Nephrology | Admitting: Nephrology

## 2022-12-18 VITALS — BP 148/86 | HR 54 | Temp 98.2°F | Resp 12

## 2022-12-18 DIAGNOSIS — N184 Chronic kidney disease, stage 4 (severe): Secondary | ICD-10-CM | POA: Insufficient documentation

## 2022-12-18 DIAGNOSIS — D500 Iron deficiency anemia secondary to blood loss (chronic): Secondary | ICD-10-CM

## 2022-12-18 DIAGNOSIS — Z01812 Encounter for preprocedural laboratory examination: Secondary | ICD-10-CM | POA: Diagnosis present

## 2022-12-18 DIAGNOSIS — D631 Anemia in chronic kidney disease: Secondary | ICD-10-CM | POA: Insufficient documentation

## 2022-12-18 MED ORDER — ACETAMINOPHEN 325 MG PO TABS
650.0000 mg | ORAL_TABLET | Freq: Once | ORAL | Status: AC
  Administered 2022-12-18: 650 mg via ORAL

## 2022-12-18 MED ORDER — SODIUM CHLORIDE 0.9 % IV SOLN
200.0000 mg | Freq: Once | INTRAVENOUS | Status: AC
  Administered 2022-12-18: 200 mg via INTRAVENOUS
  Filled 2022-12-18: qty 200

## 2022-12-18 MED ORDER — DIPHENHYDRAMINE HCL 25 MG PO CAPS
25.0000 mg | ORAL_CAPSULE | Freq: Once | ORAL | Status: AC
  Administered 2022-12-18: 25 mg via ORAL

## 2022-12-18 NOTE — Addendum Note (Signed)
Encounter addended by: Wyvonne Lenz, RN on: 12/18/2022 12:11 PM  Actions taken: Therapy plan modified

## 2022-12-18 NOTE — Progress Notes (Signed)
Diagnosis: Iron Deficiency Anemia  Provider:  Celso Amy MD  Procedure: IV Infusion  IV Type: Peripheral, IV Location: R Upper Arm  Venofer (Iron Sucrose), Dose: 200 mg  Infusion Start Time: 0937 am  Infusion Stop Time: 0952 am  Post Infusion IV Care: Observation period completed and Peripheral IV Discontinued  Discharge: Condition: Good, Destination: Home . AVS Provided  Performed by:  Forrest Moron, RN

## 2022-12-24 ENCOUNTER — Encounter (HOSPITAL_COMMUNITY): Admission: RE | Admit: 2022-12-24 | Payer: 59 | Source: Ambulatory Visit

## 2022-12-24 ENCOUNTER — Other Ambulatory Visit (HOSPITAL_COMMUNITY): Payer: Self-pay | Admitting: General Surgery

## 2022-12-24 ENCOUNTER — Telehealth (HOSPITAL_COMMUNITY): Payer: Self-pay

## 2022-12-24 DIAGNOSIS — N824 Other female intestinal-genital tract fistulae: Secondary | ICD-10-CM

## 2022-12-24 NOTE — Telephone Encounter (Signed)
Called patient to reschedule missed infusion appointment this morning. No answer. Left HIPAA-compliant voicemail requesting call back. Will also send MyChart message.  Wyvonne Lenz, RN

## 2022-12-26 ENCOUNTER — Encounter (HOSPITAL_COMMUNITY)
Admission: RE | Admit: 2022-12-26 | Discharge: 2022-12-26 | Disposition: A | Discharge status: Home / Self Care | Payer: 59 | Source: Ambulatory Visit | Attending: Nephrology | Admitting: Nephrology

## 2022-12-26 VITALS — BP 141/89 | HR 55 | Temp 98.1°F | Resp 16

## 2022-12-26 DIAGNOSIS — N189 Chronic kidney disease, unspecified: Secondary | ICD-10-CM

## 2022-12-26 DIAGNOSIS — N184 Chronic kidney disease, stage 4 (severe): Secondary | ICD-10-CM | POA: Diagnosis not present

## 2022-12-26 DIAGNOSIS — D500 Iron deficiency anemia secondary to blood loss (chronic): Secondary | ICD-10-CM

## 2022-12-26 MED ORDER — DIPHENHYDRAMINE HCL 25 MG PO CAPS
25.0000 mg | ORAL_CAPSULE | Freq: Once | ORAL | Status: AC
  Administered 2022-12-26: 25 mg via ORAL
  Filled 2022-12-26: qty 1

## 2022-12-26 MED ORDER — ACETAMINOPHEN 325 MG PO TABS
650.0000 mg | ORAL_TABLET | Freq: Once | ORAL | Status: AC
  Administered 2022-12-26: 650 mg via ORAL
  Filled 2022-12-26: qty 2

## 2022-12-26 MED ORDER — SODIUM CHLORIDE 0.9 % IV SOLN
200.0000 mg | Freq: Once | INTRAVENOUS | Status: AC
  Administered 2022-12-26: 200 mg via INTRAVENOUS
  Filled 2022-12-26: qty 200

## 2022-12-26 NOTE — Addendum Note (Signed)
Encounter addended by: Wyvonne Lenz, RN on: 12/26/2022 12:35 PM  Actions taken: Therapy plan modified

## 2022-12-26 NOTE — Progress Notes (Signed)
Diagnosis: Iron Deficiency Anemia  Provider:  Celso Amy MD  Procedure: IV Infusion  IV Type: Peripheral, IV Location: R Forearm  Venofer (Iron Sucrose), Dose: 200 mg  Infusion Start Time: 0941  Infusion Stop Time: 1005  Post Infusion IV Care: Peripheral IV Discontinued  Discharge: Condition: Good, Destination: Home . AVS Provided  Performed by:  Marlow Baars [place]Burchett, RN

## 2022-12-29 ENCOUNTER — Encounter (HOSPITAL_COMMUNITY)
Admission: RE | Admit: 2022-12-29 | Discharge: 2022-12-29 | Disposition: A | Discharge status: Home / Self Care | Payer: 59 | Source: Ambulatory Visit | Attending: Nephrology | Admitting: Nephrology

## 2022-12-29 VITALS — BP 132/92 | HR 53 | Temp 97.7°F | Resp 18

## 2022-12-29 DIAGNOSIS — D500 Iron deficiency anemia secondary to blood loss (chronic): Secondary | ICD-10-CM

## 2022-12-29 DIAGNOSIS — D631 Anemia in chronic kidney disease: Secondary | ICD-10-CM

## 2022-12-29 DIAGNOSIS — Z01818 Encounter for other preprocedural examination: Secondary | ICD-10-CM | POA: Diagnosis present

## 2022-12-29 DIAGNOSIS — N184 Chronic kidney disease, stage 4 (severe): Secondary | ICD-10-CM

## 2022-12-29 MED ORDER — SODIUM CHLORIDE 0.9 % IV SOLN
200.0000 mg | Freq: Once | INTRAVENOUS | Status: AC
  Administered 2022-12-29: 200 mg via INTRAVENOUS
  Filled 2022-12-29: qty 200

## 2022-12-29 MED ORDER — ACETAMINOPHEN 325 MG PO TABS
650.0000 mg | ORAL_TABLET | Freq: Once | ORAL | Status: AC
  Administered 2022-12-29: 650 mg via ORAL

## 2022-12-29 MED ORDER — DIPHENHYDRAMINE HCL 25 MG PO CAPS
25.0000 mg | ORAL_CAPSULE | Freq: Once | ORAL | Status: AC
  Administered 2022-12-29: 25 mg via ORAL

## 2022-12-29 NOTE — Addendum Note (Signed)
Encounter addended by: Wyvonne Lenz, RN on: 12/29/2022 1:21 PM  Actions taken: Flowsheet accepted

## 2022-12-29 NOTE — Progress Notes (Signed)
Diagnosis: Iron Deficiency Anemia  Provider:  Celso Amy MD  Procedure: IV Infusion  IV Type: Peripheral, IV Location: R Forearm  Venofer (Iron Sucrose), Dose: 200 mg  Infusion Start Time: 1232  Infusion Stop Time: 1248  Post Infusion IV Care: Peripheral IV Discontinued  Discharge: Condition: Good, Destination: Home . AVS Declined  Performed by:  Marlow Baars [place]tt, RN

## 2022-12-29 NOTE — Addendum Note (Signed)
Encounter addended by: Wyvonne Lenz, RN on: 12/29/2022 1:12 PM  Actions taken: Therapy plan modified

## 2022-12-30 ENCOUNTER — Encounter: Payer: Self-pay | Admitting: Family Medicine

## 2022-12-30 ENCOUNTER — Ambulatory Visit: Payer: 59 | Admitting: Family Medicine

## 2022-12-30 VITALS — BP 124/72 | HR 49 | Temp 97.6°F | Ht 67.0 in | Wt 207.2 lb

## 2022-12-30 DIAGNOSIS — N3000 Acute cystitis without hematuria: Secondary | ICD-10-CM

## 2022-12-30 DIAGNOSIS — R3915 Urgency of urination: Secondary | ICD-10-CM | POA: Insufficient documentation

## 2022-12-30 DIAGNOSIS — K5909 Other constipation: Secondary | ICD-10-CM | POA: Diagnosis not present

## 2022-12-30 DIAGNOSIS — F419 Anxiety disorder, unspecified: Secondary | ICD-10-CM

## 2022-12-30 DIAGNOSIS — K219 Gastro-esophageal reflux disease without esophagitis: Secondary | ICD-10-CM

## 2022-12-30 LAB — URINALYSIS, ROUTINE W REFLEX MICROSCOPIC
Bilirubin Urine: NEGATIVE
Glucose, UA: NEGATIVE
Hyaline Cast: NONE SEEN /LPF
Ketones, ur: NEGATIVE
Nitrite: NEGATIVE
Specific Gravity, Urine: 1.02 (ref 1.001–1.035)
pH: 5.5 (ref 5.0–8.0)

## 2022-12-30 LAB — MICROSCOPIC MESSAGE

## 2022-12-30 MED ORDER — FLUCONAZOLE 150 MG PO TABS: 150.0000 mg | ORAL_TABLET | Freq: Once | ORAL | 0 refills | Status: AC

## 2022-12-30 MED ORDER — SERTRALINE HCL 100 MG PO TABS: 100.0000 mg | ORAL_TABLET | Freq: Two times a day (BID) | ORAL | 1 refills | Status: DC

## 2022-12-30 MED ORDER — PANTOPRAZOLE SODIUM 40 MG PO TBEC: DELAYED_RELEASE_TABLET | ORAL | 1 refills | Status: DC

## 2022-12-30 MED ORDER — FOSFOMYCIN TROMETHAMINE 3 G PO PACK
3.0000 g | PACK | Freq: Once | ORAL | 0 refills | Status: DC
Start: 2022-12-30 — End: 2023-01-16

## 2022-12-30 MED ORDER — LINACLOTIDE 72 MCG PO CAPS
ORAL_CAPSULE | ORAL | 1 refills | Status: AC
Start: 2022-12-30 — End: ?

## 2022-12-30 NOTE — Assessment & Plan Note (Signed)
UA positive Urine dipstick shows positive for WBC's, positive for RBC's, and positive for nitrates.  Micro exam: 40-60 WBC's per HPF, 3-10 RBC's per HPF, and many+ bacteria. Start Fosfomycin 3g once. Will also send Diflucan for yeast infection at her request. Instructed to take only if she becomes symptomatic. Will culture. Return to office if symptoms persist or worsen.
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
## 2022-12-30 NOTE — Assessment & Plan Note (Signed)
Zoloft refilled today

## 2022-12-31 ENCOUNTER — Telehealth: Payer: Self-pay

## 2022-12-31 NOTE — Telephone Encounter (Signed)
Please email it is scanned in media

## 2022-12-31 NOTE — Telephone Encounter (Signed)
Pt needs a copy of foster care paperwork sent to her that we filled out for her back in the fall of 2023.   [email or web address]

## 2023-01-01 ENCOUNTER — Ambulatory Visit (HOSPITAL_COMMUNITY)
Admission: RE | Admit: 2023-01-01 | Discharge: 2023-01-01 | Disposition: A | Discharge status: Home / Self Care | Payer: 59 | Source: Ambulatory Visit | Attending: General Surgery | Admitting: General Surgery

## 2023-01-01 ENCOUNTER — Encounter (HOSPITAL_COMMUNITY): Payer: Self-pay

## 2023-01-01 DIAGNOSIS — N824 Other female intestinal-genital tract fistulae: Secondary | ICD-10-CM | POA: Diagnosis present

## 2023-01-01 MED ORDER — IOHEXOL 9 MG/ML PO SOLN
500.0000 mL | ORAL | Status: AC
  Administered 2023-01-01 (×2): 500 mL via ORAL

## 2023-01-01 MED ORDER — IOHEXOL 300 MG/ML  SOLN
50.0000 mL | Freq: Once | INTRAMUSCULAR | Status: AC | PRN
  Administered 2023-01-01: 50 mL

## 2023-01-02 LAB — URINE CULTURE
MICRO NUMBER:: 15038947
SPECIMEN QUALITY:: ADEQUATE

## 2023-01-02 LAB — POCT I-STAT CREATININE: Creatinine, Ser: 2.3 mg/dL — ABNORMAL HIGH (ref 0.44–1.00)

## 2023-01-15 ENCOUNTER — Other Ambulatory Visit: Payer: Self-pay | Admitting: Family Medicine

## 2023-01-15 ENCOUNTER — Telehealth: Payer: 59 | Admitting: Physician Assistant

## 2023-01-15 DIAGNOSIS — N390 Urinary tract infection, site not specified: Secondary | ICD-10-CM

## 2023-01-15 DIAGNOSIS — N321 Vesicointestinal fistula: Secondary | ICD-10-CM

## 2023-01-16 ENCOUNTER — Other Ambulatory Visit: Payer: Self-pay | Admitting: Family Medicine

## 2023-01-16 DIAGNOSIS — N3000 Acute cystitis without hematuria: Secondary | ICD-10-CM

## 2023-01-16 MED ORDER — CEPHALEXIN 500 MG PO CAPS
500.0000 mg | ORAL_CAPSULE | Freq: Two times a day (BID) | ORAL | 0 refills | Status: DC
Start: 2023-01-16 — End: 2023-03-16

## 2023-01-16 MED ORDER — FOSFOMYCIN TROMETHAMINE 3 G PO PACK
3.0000 g | PACK | Freq: Once | ORAL | 0 refills | Status: AC
Start: 2023-01-16 — End: 2023-01-16

## 2023-01-16 NOTE — Progress Notes (Signed)

## 2023-01-16 NOTE — Telephone Encounter (Signed)
Spoke w/pt about the Keflex? Per pt stated that she was worried about the having allergies to the medication. Advise pt to talk to the pharmacist about it when she pick up the med. Also told pt that this antibiotic is use to treat UTI's. However if she is still unsure of the medication then told her to go to [facility] to talk to someone face to face due we will not have any providers next week, both are on vacation.   Pt voiced understanding and nothing further.

## 2023-01-16 NOTE — Telephone Encounter (Signed)
Pls see pt msg.  Pls advice?

## 2023-01-26 ENCOUNTER — Ambulatory Visit: Payer: Self-pay | Admitting: General Surgery

## 2023-01-26 NOTE — H&P (Signed)
REFERRING PHYSICIAN:  Kurtis Bushman, NP  PROVIDER:  Elenora Gamma, MD  MRN: [id number] DOB: Aug 02, 1970 DATE OF ENCOUNTER: 01/26/2023  Subjective  History of Present Illness:  Patient presents to the office with stool leakage during urination mostly.  It sounds like she thought that this was coming vaginally and was evaluated by her gynecologist.  No obvious fistula was noted.  She is here today for anal evaluation.  Patient has a history of diverticulosis without known diverticulitis.  She has a history of an episiotomy in the past.  She denies any anorectal surgeries.  She has not had a hysterectomy. Colonoscopy performed March 2024 showed diverticulosis 3 polyps in the ascending colon, 1 polyp in the transverse colon and 1 polyp in the sigmoid colon.  These were all resected and removed.  Sigmoid colon polyp was tattooed.  I saw her in the office and found nothing distally concerning for a fistula.  I recommended a pelvic CT scan to evaluate for a more proximal fistula.  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  There was quite a bit of thickening in the colon concerning for chronic inflammation and diverticulitis.  Review of Systems: A complete review of systems was obtained from the patient.  I have reviewed this information and discussed as appropriate with the patient.  See HPI as well for other ROS.   Medical History: Past Medical History: Diagnosis Date  Anemia   Anxiety   Arthritis   Chronic kidney disease   GERD (gastroesophageal reflux disease)   Hyperlipidemia   Hypertension   Sleep apnea    Patient Active Problem List Diagnosis  Type 2 diabetes mellitus without complications (CMS/HHS-HCC)  Obstructive sleep apnea (adult) (pediatric)  Chronic constipation   History reviewed. No pertinent surgical history.   Allergies Allergen Reactions  Penicillins Anaphylaxis, Hives and  Itching  Quetiapine Anaphylaxis   Hypotension, alt mental status  Codeine Hives and Itching  Latex Rash   Current Outpatient Medications on File Prior to Visit Medication Sig Dispense Refill  acetaminophen (TYLENOL) 500 MG tablet Take 1,000 mg by mouth    atorvastatin (LIPITOR) 20 MG tablet Take 1 tablet by mouth once daily    BERBERINE CHLORIDE ORAL Take by mouth    carvediloL (COREG) 6.25 MG tablet Take 2 tablets by mouth 2 (two) times daily    cetirizine (ZYRTEC) 10 MG tablet Take 1 tablet by mouth once daily    chlorthalidone 25 MG tablet TAKE 0.5 TABLETS BY MOUTH 1 TIME EACH DAY.    EPINEPHrine (EPIPEN) 0.3 mg/0.3 mL auto-injector Inject into the muscle    ferrous sulfate 325 (65 FE) MG EC tablet Take 325 mg by mouth    hydroCHLOROthiazide (HYDRODIURIL) 25 MG tablet Take 1 tablet by mouth once daily    lamoTRIgine (LAMICTAL) 200 MG tablet Take 1 tablet by mouth 2 (two) times daily    linaCLOtide (LINZESS) 145 mcg capsule TAKE 1 CAPSULE BY MOUTH EVERY DAY BEFORE BREAKFAST    pantoprazole (PROTONIX) 40 MG DR tablet TAKE 1 TABLET BY MOUTH TWICE A DAY BEFORE A MEAL    sertraline (ZOLOFT) 100 MG tablet Take 1 tablet by mouth 2 (two) times daily    No current facility-administered medications on file prior to visit.   Family History Problem Relation Age of Onset  High blood pressure (Hypertension) Mother   Obesity Mother   Diabetes Mother   Hyperlipidemia (Elevated cholesterol) Father  Coronary Artery Disease (Blocked arteries around heart) Father   High blood pressure (Hypertension) Father   Colon cancer Sister   Obesity Sister   High blood pressure (Hypertension) Sister   Diabetes Sister   Obesity Brother   High blood pressure (Hypertension) Brother     Social History  Tobacco Use Smoking Status Former  Types: Cigarettes Smokeless Tobacco Never    Social History  Socioeconomic History  Marital status: Married Tobacco Use  Smoking  status: Former   Types: Cigarettes  Smokeless tobacco: Never Vaping Use  Vaping status: Never Used Substance and Sexual Activity  Alcohol use: Never  Drug use: Never   Objective:   Vitals:  01/26/23 1509 PainSc: 0-No pain     Exam Gen: NAD Abd: soft Rectal: Slight anal gape, good squeeze and normal push    Labs, Imaging and Diagnostic Testing:  CT scan reviewed.  Patient with what appears to be a colovesicular fistula to the dome of the bladder on the left side.  Significant wall thickening noted throughout the sigmoid colon  Assessment and Plan: Diagnoses and all orders for this visit:  Colovesical fistula. 52 year old female with colon thickening and concern for Colo-vesicular fistula.  She has had a recent colonoscopy which was normal and negative for malignancy.  I recommend proceeding with a robotic assisted partial colectomy.  We discussed the possible need for bladder repair.  We discussed the need for firefly injection for ureter identification.  All questions were answered.  The surgery and anatomy were described to the patient as well as the risks of surgery and the possible complications.  These include: Bleeding, deep abdominal infections and possible wound complications such as hernia and infection, damage to adjacent structures, leak of surgical connections, which can lead to other surgeries and possibly an ostomy, possible need for other procedures, such as abscess drains in radiology, possible prolonged hospital stay, possible diarrhea from removal of part of the colon, possible constipation from narcotics, possible bowel, bladder or sexual dysfunction if having rectal surgery, prolonged fatigue/weakness or appetite loss, possible early recurrence of of disease, possible complications of their medical problems such as heart disease or arrhythmias or lung problems, death (less than 1%). I believe the patient understands and wishes to proceed with the surgery.      Vanita Panda, MD Colon and Rectal Surgery [facility]

## 2023-01-27 ENCOUNTER — Ambulatory Visit: Payer: 59 | Admitting: Family Medicine

## 2023-02-04 IMAGING — US US RENAL
1 series · 14 of 25 positions shown · non-contrast
Comparison: 12/23/2012

CLINICAL DATA: Chronic renal insufficiency

EXAM:
RENAL / URINARY TRACT ULTRASOUND COMPLETE

[Series 1: us renal · 14 of 59 slices shown]
[im 1/59]
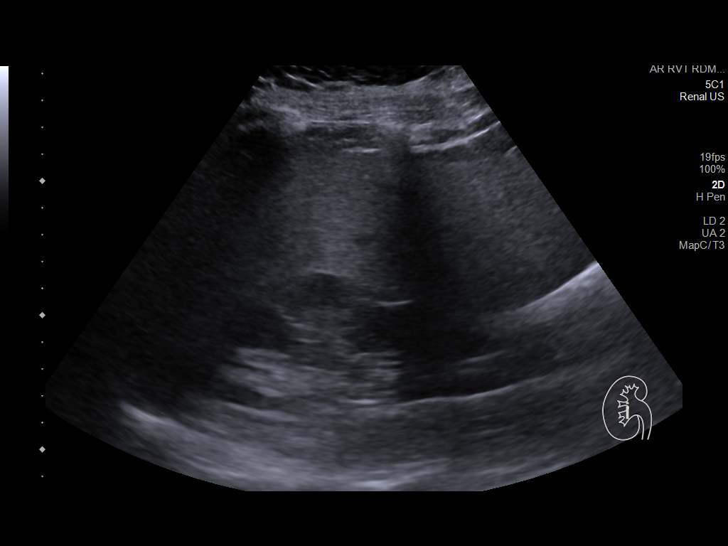
[im 5/59]
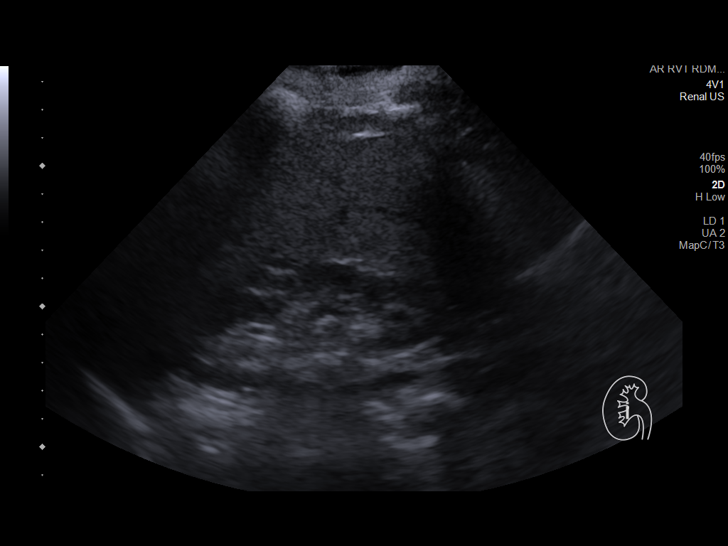
[im 10/59]
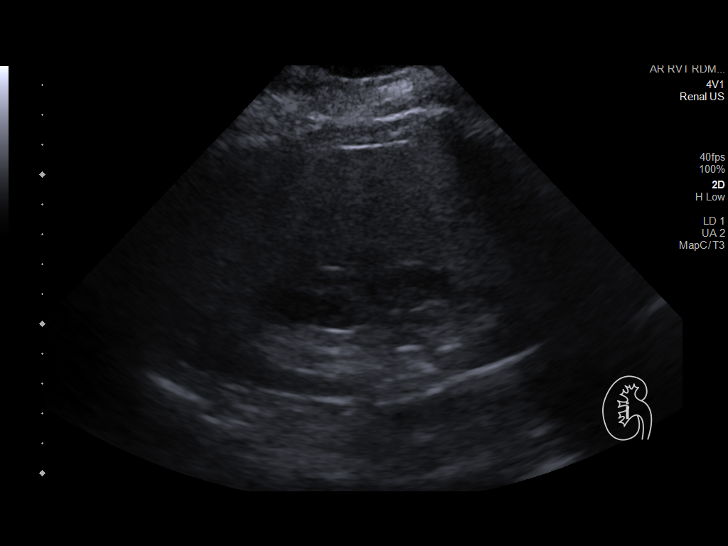
[im 15/59]
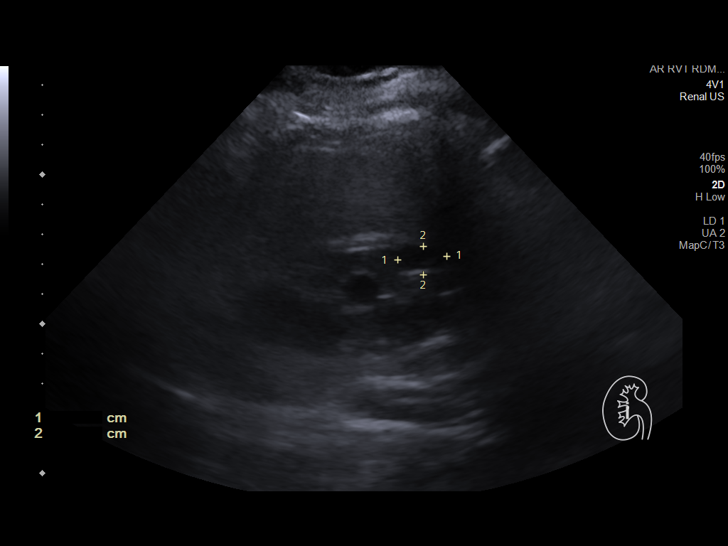
[im 20/59]
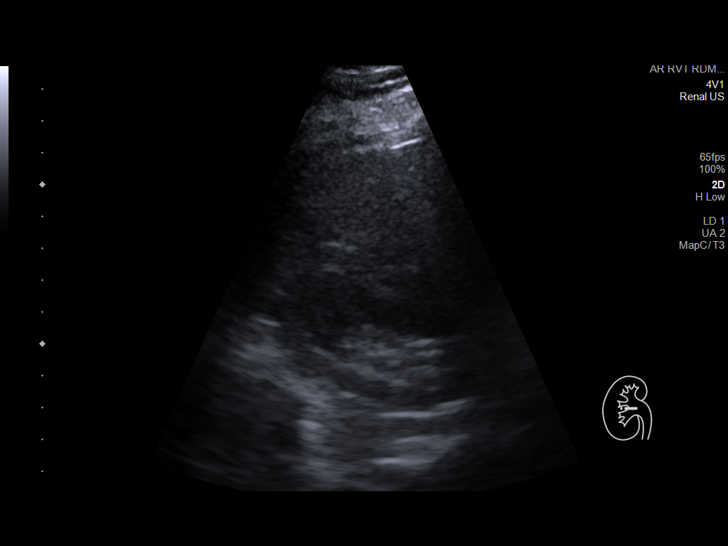
[im 22/59]
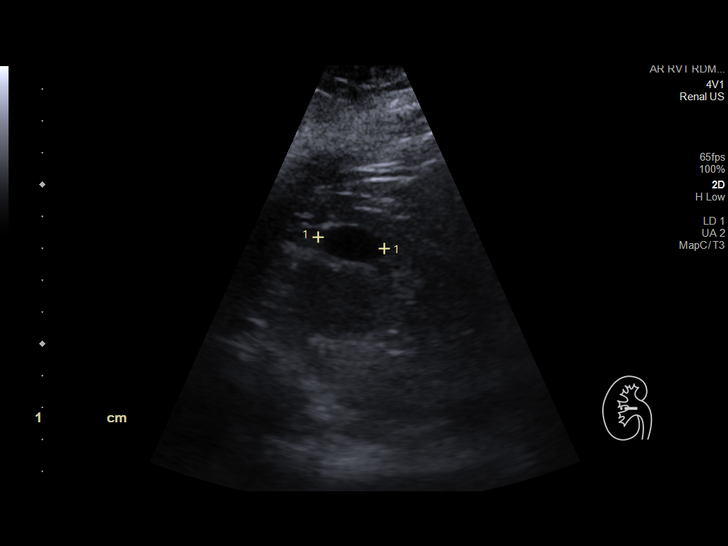
[im 27/59]
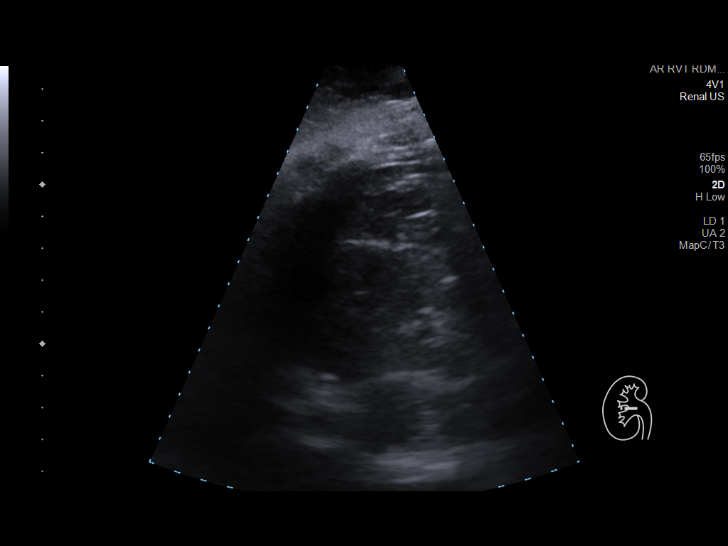
[im 32/59]
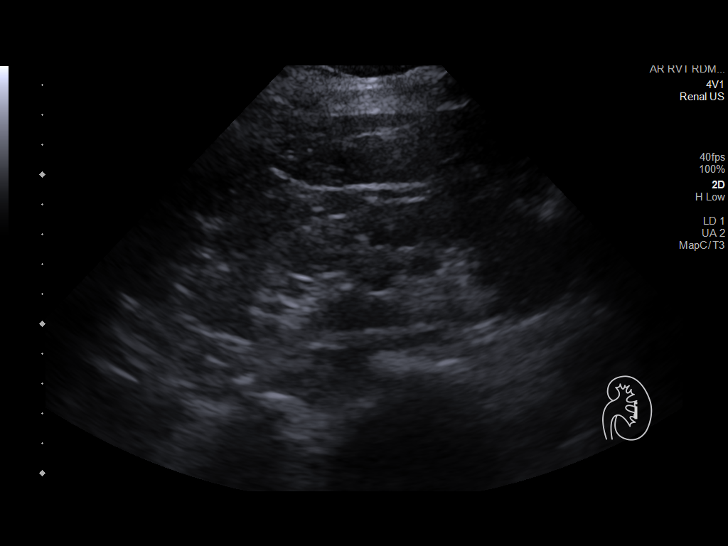
[im 37/59]
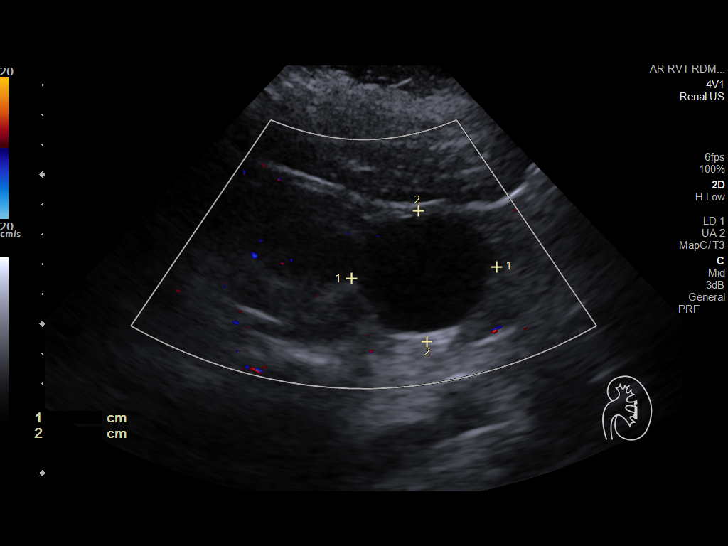
[im 39/59]
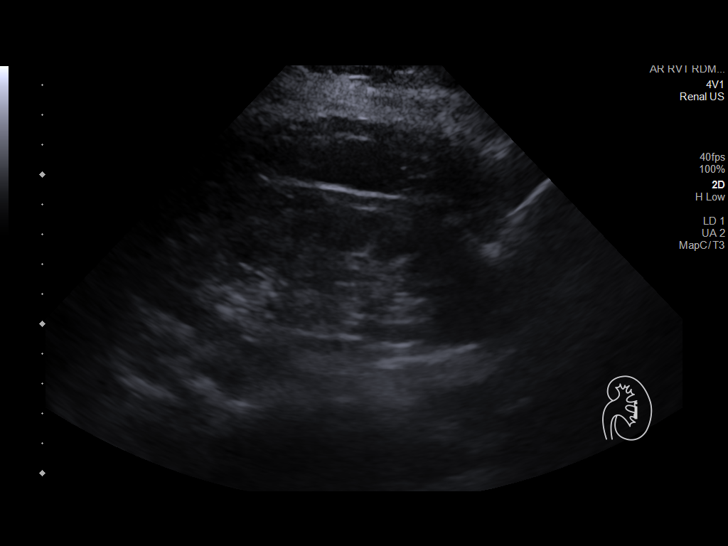
[im 44/59]
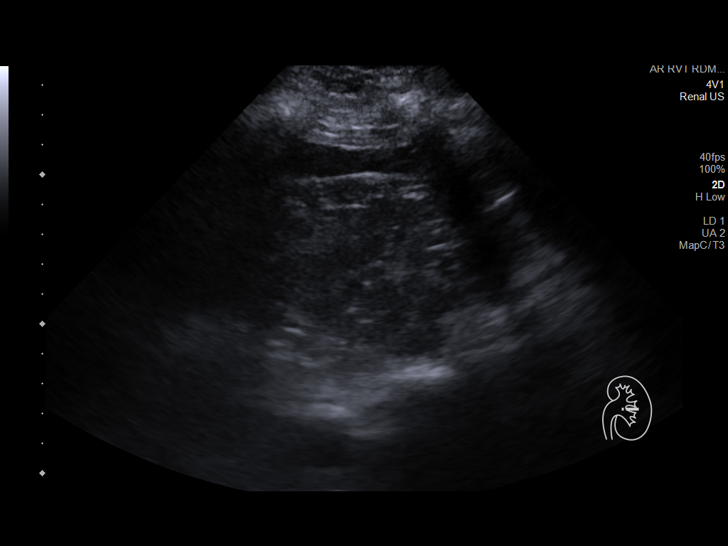
[im 49/59]
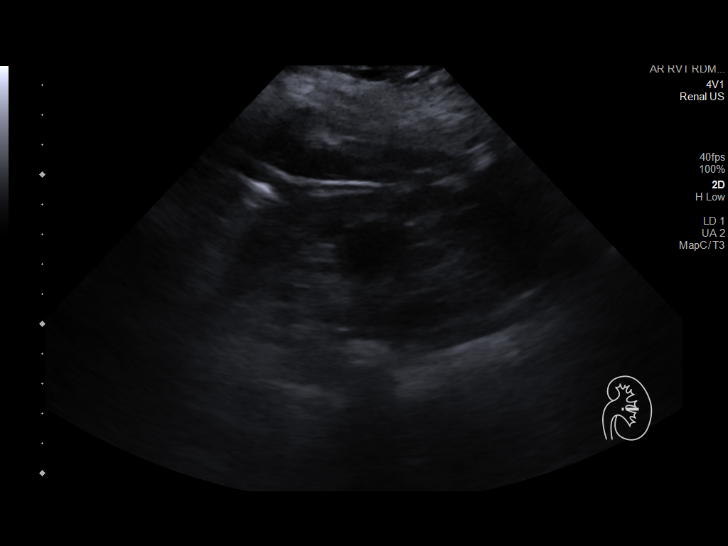
[im 54/59]
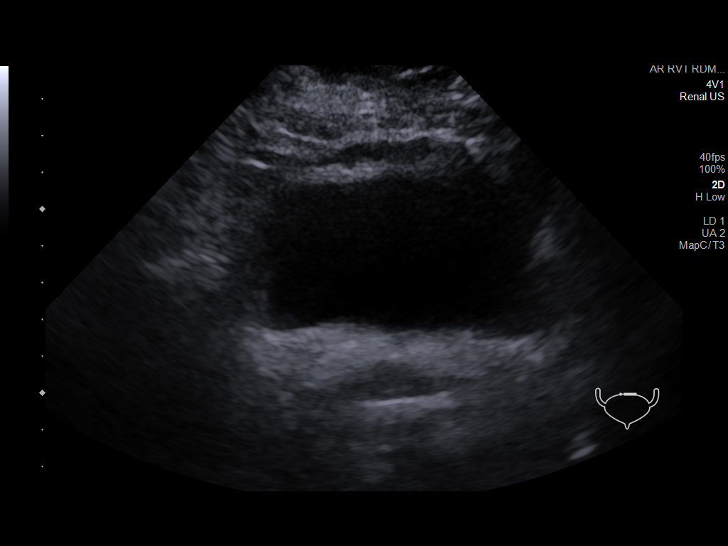
[im 59/59]
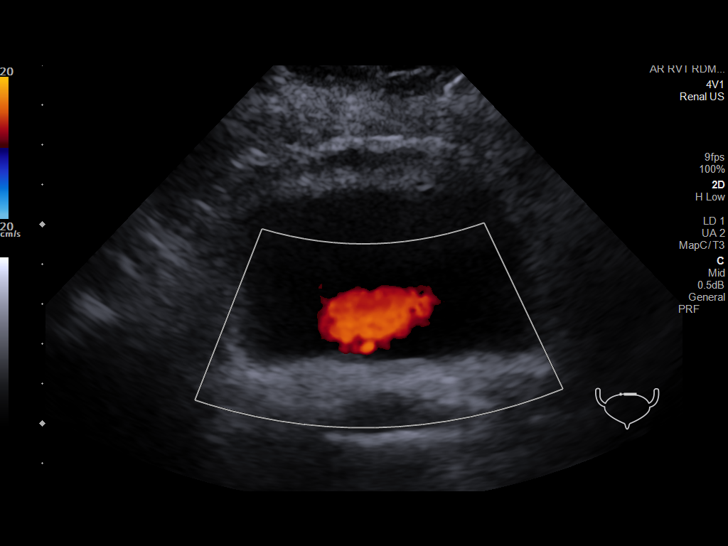

[14 of 25 positions shown; findings below may reference images not displayed]

FINDINGS: Right Kidney:

Renal measurements: 11.7 x 5.7 x 7.6 cm = volume: 267.0 mL. There is
right renal cortical thinning, with mild increased renal cortical
echotexture. Small cortical cysts are identified, largest measuring
2.2 cm. No hydronephrosis.

Left Kidney:

Renal measurements: 13.1 x 4.5 by 7.3 cm = volume: 224.1 mL. There
is left renal cortical thinning, with mild increased renal cortical
echotexture. Multiple left renal cortical cysts are identified,
largest inferolaterally measuring up to 4.4 cm. No hydronephrosis.

Bladder:

Appears normal for degree of bladder distention.

Other:

None.
IMPRESSION: 1. Mild increased renal cortical echotexture and renal cortical
thinning, consistent with chronic medical renal disease.
2. Bilateral simple renal cysts.

## 2023-02-11 ENCOUNTER — Other Ambulatory Visit: Payer: Self-pay

## 2023-03-03 ENCOUNTER — Other Ambulatory Visit: Payer: Self-pay | Admitting: Urology

## 2023-03-03 NOTE — Progress Notes (Addendum)
COVID Vaccine Completed:  Yes  Date of COVID positive in last 90 days:  PCP - Kurtis Bushman, FNP Cardiologist - Nona Dell, MD (last OV 2018) Nephrologist - Dennison Bulla, MD  Chest x-ray -  EKG - 10-08-22 Epic Stress Test - 11-20-16 Epic ECHO -  Cardiac Cath -  Pacemaker/ICD device last checked: Spinal Cord Stimulator: Holter Monitor - 11-17-16 Epic  Bowel Prep -   Sleep Study - Yes, +sleep apnea CPAP -   Fasting Blood Sugar -  Checks Blood Sugar _____ times a day  Last dose of GLP1 agonist-  N/A GLP1 instructions:  N/A   Last dose of SGLT-2 inhibitors-  N/A SGLT-2 instructions: N/A   Blood Thinner Instructions:  Time Aspirin Instructions: Last Dose:  Activity level:  Can go up a flight of stairs and perform activities of daily living without stopping and without symptoms of chest pain or shortness of breath.  Able to exercise without symptoms  Unable to go up a flight of stairs without symptoms of     Anesthesia review:  Bradycardia, DOE, HTN, DM, OSA with CPAP, CKD  Patient denies shortness of breath, fever, cough and chest pain at PAT appointment  Patient verbalized understanding of instructions that were given to them at the PAT appointment. Patient was also instructed that they will need to review over the PAT instructions again at home before surgery.

## 2023-03-03 NOTE — Patient Instructions (Signed)
SURGICAL WAITING ROOM VISITATION Patients having surgery or a procedure may have no more than 2 support people in the waiting area - these visitors may rotate.    Children under the age of 96 must have an adult with them who is not the patient.  If the patient needs to stay at the hospital during part of their recovery, the visitor guidelines for inpatient rooms apply. Pre-op nurse will coordinate an appropriate time for 1 support person to accompany patient in pre-op.  This support person may not rotate.    Please refer to the [facility] website for the visitor guidelines for Inpatients (after your surgery is over and you are in a regular room).       Your procedure is scheduled on: 03-13-23   Report to [facility] Main Entrance    Report to admitting at 11:45 AM   Call this number if you have problems the morning of surgery [phone]   Follow a clear liquid diet day of prep to prevent dehydration   After Midnight you may have the following liquids until 11:00 AM DAY OF SURGERY  Water Non-Citrus Juices (without pulp, NO RED-Apple, White grape, White cranberry) Black Coffee (NO MILK/CREAM OR CREAMERS, sugar ok)  Clear Tea (NO MILK/CREAM OR CREAMERS, sugar ok) regular and decaf                             Plain Jell-O (NO RED)                                           Fruit ices (not with fruit pulp, NO RED)                                     Popsicles (NO RED)                                                               Sports drinks like Gatorade (NO RED)  Drink 2 Pre-surgery G2 the evening before surgery                   The day of surgery:  Drink ONE (1) Pre-Surgery G2 at 11:00 AM the morning of surgery. Drink in one sitting. Do not sip.  This drink was given to you during your hospital  pre-op appointment visit. Nothing else to drink after completing the Pre-Surgery G2.          If you have questions, please contact your surgeon's office.   FOLLOW  BOWEL PREP AND ANY ADDITIONAL PRE OP INSTRUCTIONS YOU RECEIVED FROM YOUR SURGEON'S OFFICE!!!   -Clear liquids day of prep to prevent dehydration  - Bisacodyl (Dulcolax) 20 mg - Give with water the day prior to surgery.   - Miralax 255g - Mix with 64 oz Gatorade/Powerade.  Drink gradually over the next few hours (8 oz glass every 15-30 minutes) until gone the day prior to surgery.   -Metronidazole (Flagyl) 1000 mg - At 2 pm, 3 pm and 10 pm after Miralax bowel prep the  day prior to surgery.       -Neomycin 1000 mg - At 2 pm, 3 pm and 10 pm after Miralax  bowel prep the day prior to surgery.   Oral Hygiene is also important to reduce your risk of infection.                                    Remember - BRUSH YOUR TEETH THE MORNING OF SURGERY WITH YOUR REGULAR TOOTHPASTE   Do NOT smoke after Midnight   Take these medicines the morning of surgery with A SIP OF WATER:   Atorvastatin  Carvedilol  LamoTrigine  Pantoprazole  Sertraline  Zyrtec  Tylenol if needed  Stop all vitamins and herbal supplements 7 days before surgery  DO NOT TAKE ANY ORAL DIABETIC MEDICATIONS DAY OF YOUR SURGERY  Bring CPAP mask and tubing day of surgery.                              You may not have any metal on your body including hair pins, jewelry, and body piercing             Do not wear make-up, lotions, powders, perfumes or deodorant  Do not wear nail polish including gel and S&S, artificial/acrylic nails, or any other type of covering on natural nails including finger and toenails. If you have artificial nails, gel coating, etc. that needs to be removed by a nail salon please have this removed prior to surgery or surgery may need to be canceled/ delayed if the surgeon/ anesthesia feels like they are unable to be safely monitored.   Do not shave  48 hours prior to surgery.    Do not bring valuables to the hospital. [facility] IS NOT RESPONSIBLE   FOR VALUABLES.   Contacts, dentures or bridgework may  not be worn into surgery.   Bring small overnight bag day of surgery.   DO NOT BRING YOUR HOME MEDICATIONS TO THE HOSPITAL. PHARMACY WILL DISPENSE MEDICATIONS LISTED ON YOUR MEDICATION LIST TO YOU DURING YOUR ADMISSION IN THE HOSPITAL!   Special Instructions: Bring a copy of your healthcare power of attorney and living will documents the day of surgery if you haven't scanned them before.              Please read over the following fact sheets you were given: IF YOU HAVE QUESTIONS ABOUT YOUR PRE-OP INSTRUCTIONS PLEASE CALL [phone] [facility]  If you received a COVID test during your pre-op visit  it is requested that you wear a mask when out in public, stay away from anyone that may not be feeling well and notify your surgeon if you develop symptoms. If you test positive for Covid or have been in contact with anyone that has tested positive in the last 10 days please notify you surgeon.  [facility] - Preparing for Surgery Before surgery, you can play an important role.  Because skin is not sterile, your skin needs to be as free of germs as possible.  You can reduce the number of germs on your skin by washing with CHG (chlorahexidine gluconate) soap before surgery.  CHG is an antiseptic cleaner which kills germs and bonds with the skin to continue killing germs even after washing. Please DO NOT use if you have an allergy to CHG or antibacterial soaps.  If your skin  becomes reddened/irritated stop using the CHG and inform your nurse when you arrive at Short Stay. Do not shave (including legs and underarms) for at least 48 hours prior to the first CHG shower.  You may shave your face/neck.  Please follow these instructions carefully:  1.  Shower with CHG Soap the night before surgery and the  morning of surgery.  2.  If you choose to wash your hair, wash your hair first as usual with your normal  shampoo.  3.  After you shampoo, rinse your hair and body thoroughly to remove the shampoo.                              4.  Use CHG as you would any other liquid soap.  You can apply chg directly to the skin and wash.  Gently with a scrungie or clean washcloth.  5.  Apply the CHG Soap to your body ONLY FROM THE NECK DOWN.   Do   not use on face/ open                           Wound or open sores. Avoid contact with eyes, ears mouth and   genitals (private parts).                       Wash face,  Genitals (private parts) with your normal soap.             6.  Wash thoroughly, paying special attention to the area where your    surgery  will be performed.  7.  Thoroughly rinse your body with warm water from the neck down.  8.  DO NOT shower/wash with your normal soap after using and rinsing off the CHG Soap.                9.  Pat yourself dry with a clean towel.            10.  Wear clean pajamas.            11.  Place clean sheets on your bed the night of your first shower and do not  sleep with pets. Day of Surgery : Do not apply any lotions/deodorants the morning of surgery.  Please wear clean clothes to the hospital/surgery center.  FAILURE TO FOLLOW THESE INSTRUCTIONS MAY RESULT IN THE CANCELLATION OF YOUR SURGERY  PATIENT SIGNATURE_________________________________  NURSE SIGNATURE__________________________________  ________________________________________________________________________    Rogelia Mire  An incentive spirometer is a tool that can help keep your lungs clear and active. This tool measures how well you are filling your lungs with each breath. Taking long deep breaths may help reverse or decrease the chance of developing breathing (pulmonary) problems (especially infection) following: A long period of time when you are unable to move or be active. BEFORE THE PROCEDURE  If the spirometer includes an indicator to show your best effort, your nurse or respiratory therapist will set it to a desired goal. If possible, sit up straight or lean slightly forward. Try not to  slouch. Hold the incentive spirometer in an upright position. INSTRUCTIONS FOR USE  Sit on the edge of your bed if possible, or sit up as far as you can in bed or on a chair. Hold the incentive spirometer in an upright position. Breathe out normally. Place the mouthpiece in your mouth and seal  your lips tightly around it. Breathe in slowly and as deeply as possible, raising the piston or the ball toward the top of the column. Hold your breath for 3-5 seconds or for as long as possible. Allow the piston or ball to fall to the bottom of the column. Remove the mouthpiece from your mouth and breathe out normally. Rest for a few seconds and repeat Steps 1 through 7 at least 10 times every 1-2 hours when you are awake. Take your time and take a few normal breaths between deep breaths. The spirometer may include an indicator to show your best effort. Use the indicator as a goal to work toward during each repetition. After each set of 10 deep breaths, practice coughing to be sure your lungs are clear. If you have an incision (the cut made at the time of surgery), support your incision when coughing by placing a pillow or rolled up towels firmly against it. Once you are able to get out of bed, walk around indoors and cough well. You may stop using the incentive spirometer when instructed by your caregiver.  RISKS AND COMPLICATIONS Take your time so you do not get dizzy or light-headed. If you are in pain, you may need to take or ask for pain medication before doing incentive spirometry. It is harder to take a deep breath if you are having pain. AFTER USE Rest and breathe slowly and easily. It can be helpful to keep track of a log of your progress. Your caregiver can provide you with a simple table to help with this. If you are using the spirometer at home, follow these instructions: SEEK MEDICAL CARE IF:  You are having difficultly using the spirometer. You have trouble using the spirometer as often as  instructed. Your pain medication is not giving enough relief while using the spirometer. You develop fever of 100.5 F (38.1 C) or higher. SEEK IMMEDIATE MEDICAL CARE IF:  You cough up bloody sputum that had not been present before. You develop fever of 102 F (38.9 C) or greater. You develop worsening pain at or near the incision site. MAKE SURE YOU:  Understand these instructions. Will watch your condition. Will get help right away if you are not doing well or get worse. Document Released: 11/24/2006 Document Revised: 10/06/2011 Document Reviewed: 01/25/2007 [employer] Patient Information 2014 [employer], [place].   ________________________________________________________________________ WHAT IS A BLOOD TRANSFUSION? Blood Transfusion Information  A transfusion is the replacement of blood or some of its parts. Blood is made up of multiple cells which provide different functions. Red blood cells carry oxygen and are used for blood loss replacement. White blood cells fight against infection. Platelets control bleeding. Plasma helps clot blood. Other blood products are available for specialized needs, such as hemophilia or other clotting disorders. BEFORE THE TRANSFUSION  Who gives blood for transfusions?  Healthy volunteers who are fully evaluated to make sure their blood is safe. This is blood bank blood. Transfusion therapy is the safest it has ever been in the practice of medicine. Before blood is taken from a donor, a complete history is taken to make sure that person has no history of diseases nor engages in risky social behavior (examples are intravenous drug use or sexual activity with multiple partners). The donor's travel history is screened to minimize risk of transmitting infections, such as malaria. The donated blood is tested for signs of infectious diseases, such as HIV and hepatitis. The blood is then tested to be sure it is compatible  with you in order to minimize the chance of  a transfusion reaction. If you or a relative donates blood, this is often done in anticipation of surgery and is not appropriate for emergency situations. It takes many days to process the donated blood. RISKS AND COMPLICATIONS Although transfusion therapy is very safe and saves many lives, the main dangers of transfusion include:  Getting an infectious disease. Developing a transfusion reaction. This is an allergic reaction to something in the blood you were given. Every precaution is taken to prevent this. The decision to have a blood transfusion has been considered carefully by your caregiver before blood is given. Blood is not given unless the benefits outweigh the risks. AFTER THE TRANSFUSION Right after receiving a blood transfusion, you will usually feel much better and more energetic. This is especially true if your red blood cells have gotten low (anemic). The transfusion raises the level of the red blood cells which carry oxygen, and this usually causes an energy increase. The nurse administering the transfusion will monitor you carefully for complications. HOME CARE INSTRUCTIONS  No special instructions are needed after a transfusion. You may find your energy is better. Speak with your caregiver about any limitations on activity for underlying diseases you may have. SEEK MEDICAL CARE IF:  Your condition is not improving after your transfusion. You develop redness or irritation at the intravenous (IV) site. SEEK IMMEDIATE MEDICAL CARE IF:  Any of the following symptoms occur over the next 12 hours: Shaking chills. You have a temperature by mouth above 102 F (38.9 C), not controlled by medicine. Chest, back, or muscle pain. People around you feel you are not acting correctly or are confused. Shortness of breath or difficulty breathing. Dizziness and fainting. You get a rash or develop hives. You have a decrease in urine output. Your urine turns a dark color or changes to pink, red,  or brown. Any of the following symptoms occur over the next 10 days: You have a temperature by mouth above 102 F (38.9 C), not controlled by medicine. Shortness of breath. Weakness after normal activity. The white part of the eye turns yellow (jaundice). You have a decrease in the amount of urine or are urinating less often. Your urine turns a dark color or changes to pink, red, or brown. Document Released: 07/11/2000 Document Revised: 10/06/2011 Document Reviewed: 02/28/2008 [facility] Patient Information 2014 [place], [place].  _______________________________________________________________________

## 2023-03-05 ENCOUNTER — Inpatient Hospital Stay (HOSPITAL_COMMUNITY)
Admission: RE | Admit: 2023-03-05 | Discharge: 2023-03-05 | Disposition: A | Discharge status: Home / Self Care | Payer: 59 | Source: Ambulatory Visit

## 2023-03-05 DIAGNOSIS — Z01818 Encounter for other preprocedural examination: Secondary | ICD-10-CM

## 2023-03-05 DIAGNOSIS — D649 Anemia, unspecified: Secondary | ICD-10-CM

## 2023-03-05 DIAGNOSIS — E119 Type 2 diabetes mellitus without complications: Secondary | ICD-10-CM

## 2023-03-05 DIAGNOSIS — I10 Essential (primary) hypertension: Secondary | ICD-10-CM

## 2023-03-09 NOTE — Progress Notes (Signed)
 COVID Vaccine Completed:  Yes  Date of COVID positive in last 90 days:  PCP - Kurtis Bushman, FNP Cardiologist - Nona Dell, MD (last OV 2018) Nephrologist - Dennison Bulla, MD  Chest x-ray -  EKG - 10-08-22 Epic Stress Test - 11-20-16 Epic ECHO -  Cardiac Cath -  Pacemaker/ICD device last checked: Spinal Cord Stimulator: Holter Monitor - 11-17-16 Epic  Bowel Prep -   Sleep Study - Yes, +sleep apnea CPAP -   Fasting Blood Sugar -  Checks Blood Sugar _____ times a day  Last dose of GLP1 agonist-  N/A GLP1 instructions:  N/A   Last dose of SGLT-2 inhibitors-  N/A SGLT-2 instructions: N/A   Blood Thinner Instructions:  Time Aspirin Instructions: Last Dose:  Activity level:  Can go up a flight of stairs and perform activities of daily living without stopping and without symptoms of chest pain or shortness of breath.  Able to exercise without symptoms  Unable to go up a flight of stairs without symptoms of     Anesthesia review:  Bradycardia, DOE, HTN, DM, OSA with CPAP, CKD  Patient denies shortness of breath, fever, cough and chest pain at PAT appointment  Patient verbalized understanding of instructions that were given to them at the PAT appointment. Patient was also instructed that they will need to review over the PAT instructions again at home before surgery.

## 2023-03-09 NOTE — Patient Instructions (Signed)
SURGICAL WAITING ROOM VISITATION  Patients having surgery or a procedure may have no more than 2 support people in the waiting area - these visitors may rotate.    Children under the age of 51 must have an adult with them who is not the patient.  Due to an increase in RSV and influenza rates and associated hospitalizations, children ages 63 and under may not visit patients in [facility] hospitals.  If the patient needs to stay at the hospital during part of their recovery, the visitor guidelines for inpatient rooms apply. Pre-op nurse will coordinate an appropriate time for 1 support person to accompany patient in pre-op.  This support person may not rotate.    Please refer to the [facility] website for the visitor guidelines for Inpatients (after your surgery is over and you are in a regular room).    Your procedure is scheduled on: 03/13/23   Report to [facility] Main Entrance    Report to admitting at 11:45 AM   Call this number if you have problems the morning of surgery [phone]   Follow a clear liquids diet the day before surgery   After Midnight you may have the following liquids until 11:00 AM DAY OF SURGERY  Water Non-Citrus Juices (without pulp, NO RED-Apple, White grape, White cranberry) Black Coffee (NO MILK/CREAM OR CREAMERS, sugar ok)  Clear Tea (NO MILK/CREAM OR CREAMERS, sugar ok) regular and decaf                             Plain Jell-O (NO RED)                                           Fruit ices (not with fruit pulp, NO RED)                                     Popsicles (NO RED)                                                               Sports drinks like Gatorade (NO RED)              Drink 2 G2 drinks AT 10:00 PM the night before surgery.        The day of surgery:  Drink ONE (1) Pre-Surgery G2 at 11:00 AM the morning of surgery. Drink in one sitting. Do not sip.  This drink was given to you during your hospital  pre-op appointment  visit. Nothing else to drink after completing the  Pre-Surgery G2.          If you have questions, please contact your surgeon's office.   FOLLOW BOWEL PREP AND ANY ADDITIONAL PRE OP INSTRUCTIONS YOU RECEIVED FROM YOUR SURGEON'S OFFICE!!!     Oral Hygiene is also important to reduce your risk of infection.  Remember - BRUSH YOUR TEETH THE MORNING OF SURGERY WITH YOUR REGULAR TOOTHPASTE  DENTURES WILL BE REMOVED PRIOR TO SURGERY PLEASE DO NOT APPLY "Poly grip" OR ADHESIVES!!!   Do NOT smoke after Midnight   Stop all vitamins and herbal supplements 7 days before surgery.   Take these medicines the morning of surgery with A SIP OF WATER: Tylenol, Atorvastatin, Carvedilol, Zyrtec, Lamotrigine, Pantoprazole, Sertraline   DO NOT TAKE ANY ORAL DIABETIC MEDICATIONS DAY OF YOUR SURGERY  Bring CPAP mask and tubing day of surgery.                              You may not have any metal on your body including hair pins, jewelry, and body piercing             Do not wear make-up, lotions, powders, perfumes, or deodorant  Do not wear nail polish including gel and S&S, artificial/acrylic nails, or any other type of covering on natural nails including finger and toenails. If you have artificial nails, gel coating, etc. that needs to be removed by a nail salon please have this removed prior to surgery or surgery may need to be canceled/ delayed if the surgeon/ anesthesia feels like they are unable to be safely monitored.   Do not shave  48 hours prior to surgery.    Do not bring valuables to the hospital. [facility] IS NOT             RESPONSIBLE   FOR VALUABLES.   Contacts, glasses, dentures or bridgework may not be worn into surgery.   Bring small overnight bag day of surgery.   DO NOT BRING YOUR HOME MEDICATIONS TO THE HOSPITAL. PHARMACY WILL DISPENSE MEDICATIONS LISTED ON YOUR MEDICATION LIST TO YOU DURING YOUR ADMISSION IN THE HOSPITAL!    Special  Instructions: Bring a copy of your healthcare power of attorney and living will documents the day of surgery if you haven't scanned them before.              Please read over the following fact sheets you were given: IF YOU HAVE QUESTIONS ABOUT YOUR PRE-OP INSTRUCTIONS PLEASE CALL [name]    If you received a COVID test during your pre-op visit  it is requested that you wear a mask when out in public, stay away from anyone that may not be feeling well and notify your surgeon if you develop symptoms. If you test positive for Covid or have been in contact with anyone that has tested positive in the last 10 days please notify you surgeon.     - Preparing for Surgery Before surgery, you can play an important role.  Because skin is not sterile, your skin needs to be as free of germs as possible.  You can reduce the number of germs on your skin by washing with CHG (chlorahexidine gluconate) soap before surgery.  CHG is an antiseptic cleaner which kills germs and bonds with the skin to continue killing germs even after washing. Please DO NOT use if you have an allergy to CHG or antibacterial soaps.  If your skin becomes reddened/irritated stop using the CHG and inform your nurse when you arrive at Short Stay. Do not shave (including legs and underarms) for at least 48 hours prior to the first CHG shower.  You may shave your face/neck.  Please follow these instructions carefully:  1.  Shower with CHG Soap the  night before surgery and the  morning of surgery.  2.  If you choose to wash your hair, wash your hair first as usual with your normal  shampoo.  3.  After you shampoo, rinse your hair and body thoroughly to remove the shampoo.                             4.  Use CHG as you would any other liquid soap.  You can apply chg directly to the skin and wash.  Gently with a scrungie or clean washcloth.  5.  Apply the CHG Soap to your body ONLY FROM THE NECK DOWN.   Do   not use on face/  open                           Wound or open sores. Avoid contact with eyes, ears mouth and   genitals (private parts).                       Wash face,  Genitals (private parts) with your normal soap.             6.  Wash thoroughly, paying special attention to the area where your    surgery  will be performed.  7.  Thoroughly rinse your body with warm water from the neck down.  8.  DO NOT shower/wash with your normal soap after using and rinsing off the CHG Soap.                9.  Pat yourself dry with a clean towel.            10.  Wear clean pajamas.            11.  Place clean sheets on your bed the night of your first shower and do not  sleep with pets. Day of Surgery : Do not apply any lotions/deodorants the morning of surgery.  Please wear clean clothes to the hospital/surgery center.  FAILURE TO FOLLOW THESE INSTRUCTIONS MAY RESULT IN THE CANCELLATION OF YOUR SURGERY  PATIENT SIGNATURE_________________________________  NURSE SIGNATURE__________________________________  ________________________________________________________________________ WHAT IS A BLOOD TRANSFUSION? Blood Transfusion Information  A transfusion is the replacement of blood or some of its parts. Blood is made up of multiple cells which provide different functions. Red blood cells carry oxygen and are used for blood loss replacement. White blood cells fight against infection. Platelets control bleeding. Plasma helps clot blood. Other blood products are available for specialized needs, such as hemophilia or other clotting disorders. BEFORE THE TRANSFUSION  Who gives blood for transfusions?  Healthy volunteers who are fully evaluated to make sure their blood is safe. This is blood bank blood. Transfusion therapy is the safest it has ever been in the practice of medicine. Before blood is taken from a donor, a complete history is taken to make sure that person has no history of diseases nor engages in risky social  behavior (examples are intravenous drug use or sexual activity with multiple partners). The donor's travel history is screened to minimize risk of transmitting infections, such as malaria. The donated blood is tested for signs of infectious diseases, such as HIV and hepatitis. The blood is then tested to be sure it is compatible with you in order to minimize the chance of a transfusion reaction. If you or a relative donates blood, this is  often done in anticipation of surgery and is not appropriate for emergency situations. It takes many days to process the donated blood. RISKS AND COMPLICATIONS Although transfusion therapy is very safe and saves many lives, the main dangers of transfusion include:  Getting an infectious disease. Developing a transfusion reaction. This is an allergic reaction to something in the blood you were given. Every precaution is taken to prevent this. The decision to have a blood transfusion has been considered carefully by your caregiver before blood is given. Blood is not given unless the benefits outweigh the risks. AFTER THE TRANSFUSION Right after receiving a blood transfusion, you will usually feel much better and more energetic. This is especially true if your red blood cells have gotten low (anemic). The transfusion raises the level of the red blood cells which carry oxygen, and this usually causes an energy increase. The nurse administering the transfusion will monitor you carefully for complications. HOME CARE INSTRUCTIONS  No special instructions are needed after a transfusion. You may find your energy is better. Speak with your caregiver about any limitations on activity for underlying diseases you may have. SEEK MEDICAL CARE IF:  Your condition is not improving after your transfusion. You develop redness or irritation at the intravenous (IV) site. SEEK IMMEDIATE MEDICAL CARE IF:  Any of the following symptoms occur over the next 12 hours: Shaking chills. You  have a temperature by mouth above 102 F (38.9 C), not controlled by medicine. Chest, back, or muscle pain. People around you feel you are not acting correctly or are confused. Shortness of breath or difficulty breathing. Dizziness and fainting. You get a rash or develop hives. You have a decrease in urine output. Your urine turns a dark color or changes to pink, red, or brown. Any of the following symptoms occur over the next 10 days: You have a temperature by mouth above 102 F (38.9 C), not controlled by medicine. Shortness of breath. Weakness after normal activity. The white part of the eye turns yellow (jaundice). You have a decrease in the amount of urine or are urinating less often. Your urine turns a dark color or changes to pink, red, or brown. Document Released: 07/11/2000 Document Revised: 10/06/2011 Document Reviewed: 02/28/2008 [facility] Patient Information 2014 [place], [place].  _______________________________________________________________________

## 2023-03-10 ENCOUNTER — Encounter (HOSPITAL_COMMUNITY): Payer: Self-pay

## 2023-03-10 ENCOUNTER — Encounter (HOSPITAL_COMMUNITY)
Admission: RE | Admit: 2023-03-10 | Discharge: 2023-03-10 | Disposition: A | Discharge status: Home / Self Care | Payer: 59 | Source: Ambulatory Visit | Attending: General Surgery | Admitting: General Surgery

## 2023-03-10 ENCOUNTER — Other Ambulatory Visit: Payer: Self-pay

## 2023-03-10 DIAGNOSIS — N321 Vesicointestinal fistula: Secondary | ICD-10-CM | POA: Insufficient documentation

## 2023-03-10 DIAGNOSIS — Z87891 Personal history of nicotine dependence: Secondary | ICD-10-CM | POA: Insufficient documentation

## 2023-03-10 DIAGNOSIS — D631 Anemia in chronic kidney disease: Secondary | ICD-10-CM | POA: Insufficient documentation

## 2023-03-10 DIAGNOSIS — I129 Hypertensive chronic kidney disease with stage 1 through stage 4 chronic kidney disease, or unspecified chronic kidney disease: Secondary | ICD-10-CM | POA: Insufficient documentation

## 2023-03-10 DIAGNOSIS — N189 Chronic kidney disease, unspecified: Secondary | ICD-10-CM | POA: Insufficient documentation

## 2023-03-10 DIAGNOSIS — R0609 Other forms of dyspnea: Secondary | ICD-10-CM | POA: Insufficient documentation

## 2023-03-10 DIAGNOSIS — E1122 Type 2 diabetes mellitus with diabetic chronic kidney disease: Secondary | ICD-10-CM | POA: Insufficient documentation

## 2023-03-10 DIAGNOSIS — M5412 Radiculopathy, cervical region: Secondary | ICD-10-CM | POA: Insufficient documentation

## 2023-03-10 DIAGNOSIS — D649 Anemia, unspecified: Secondary | ICD-10-CM

## 2023-03-10 DIAGNOSIS — K219 Gastro-esophageal reflux disease without esophagitis: Secondary | ICD-10-CM | POA: Insufficient documentation

## 2023-03-10 DIAGNOSIS — I10 Essential (primary) hypertension: Secondary | ICD-10-CM

## 2023-03-10 DIAGNOSIS — Z01812 Encounter for preprocedural laboratory examination: Secondary | ICD-10-CM | POA: Insufficient documentation

## 2023-03-10 DIAGNOSIS — E119 Type 2 diabetes mellitus without complications: Secondary | ICD-10-CM

## 2023-03-10 DIAGNOSIS — Z01818 Encounter for other preprocedural examination: Secondary | ICD-10-CM

## 2023-03-10 HISTORY — DX: Anemia, unspecified: D64.9

## 2023-03-10 LAB — BASIC METABOLIC PANEL
Anion gap: 9 (ref 5–15)
BUN: 27 mg/dL — ABNORMAL HIGH (ref 6–20)
CO2: 25 mmol/L (ref 22–32)
Calcium: 8.9 mg/dL (ref 8.9–10.3)
Chloride: 105 mmol/L (ref 98–111)
Creatinine, Ser: 2.39 mg/dL — ABNORMAL HIGH (ref 0.44–1.00)
GFR, Estimated: 24 mL/min — ABNORMAL LOW (ref >60)
Glucose, Bld: 94 mg/dL (ref 70–99)
Potassium: 3.6 mmol/L (ref 3.5–5.1)
Sodium: 139 mmol/L (ref 135–145)

## 2023-03-10 LAB — CBC
HCT: 35.5 % — ABNORMAL LOW (ref 36.0–46.0)
Hemoglobin: 11.1 g/dL — ABNORMAL LOW (ref 12.0–15.0)
MCH: 26.9 pg (ref 26.0–34.0)
MCHC: 31.3 g/dL (ref 30.0–36.0)
MCV: 86.2 fL (ref 80.0–100.0)
Platelets: 276 10*3/uL (ref 150–400)
RBC: 4.12 MIL/uL (ref 3.87–5.11)
RDW: 14.6 % (ref 11.5–15.5)
WBC: 5.5 10*3/uL (ref 4.0–10.5)
nRBC: 0 % (ref 0.0–0.2)

## 2023-03-10 LAB — TYPE AND SCREEN
ABO/RH(D): B POS
Antibody Screen: NEGATIVE

## 2023-03-10 NOTE — Progress Notes (Signed)
Creatinine 2.39 results routed to Dr. Maisie Fus

## 2023-03-11 ENCOUNTER — Encounter (HOSPITAL_COMMUNITY): Payer: Self-pay
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
## 2023-03-11 NOTE — Anesthesia Preprocedure Evaluation (Addendum)
Anesthesia Evaluation  Patient identified by MRN, date of birth, ID band Patient awake    Reviewed: Allergy & Precautions, NPO status , Patient's Chart, lab work & pertinent test results, reviewed documented beta blocker date and time   Airway Mallampati: II  TM Distance: >3 FB Neck ROM: Full    Dental  (+) Edentulous Upper, Dental Advisory Given   Pulmonary sleep apnea (no CPAP) , former smoker   Pulmonary exam normal breath sounds clear to auscultation       Cardiovascular hypertension, Pt. on home beta blockers and Pt. on medications Normal cardiovascular exam Rhythm:Regular Rate:Normal     Neuro/Psych  PSYCHIATRIC DISORDERS Anxiety Depression    negative neurological ROS     GI/Hepatic Neg liver ROS, PUD,GERD  ,,  Endo/Other  diabetes, Type 2    Renal/GU Renal diseaseLab Results      Component                Value               Date                      NA                       139                 03/10/2023                CL                       105                 03/10/2023                K                        3.6                 03/10/2023                CO2                      25                  03/10/2023                BUN                      27 (H)              03/10/2023                CREATININE               2.39 (H)            03/10/2023                GFRNONAA                 24 (L)              03/10/2023                CALCIUM                  8.9  03/10/2023                PHOS                     3.5                 06/14/2021                ALBUMIN                  4.3                 07/29/2022                GLUCOSE                  94                  03/10/2023             negative genitourinary   Musculoskeletal negative musculoskeletal ROS (+)    Abdominal   Peds  Hematology negative hematology ROS (+)   Anesthesia Other Findings Cheryl Scott is a 52 yo female  who presents to PAT prior to surgery above. PMH significant for former smoking, aortic atherosclerosis (seen on CT), anxiety, depression, chronic DOE, bradycardia, GERD, neck pain w radiculopathy, CKD, anemia  Reproductive/Obstetrics                             Anesthesia Physical Anesthesia Plan  ASA: 3  Anesthesia Plan: General   Post-op Pain Management: Tylenol PO (pre-op)* and Ketamine IV*   Induction: Intravenous  PONV Risk Score and Plan: 3 and Midazolam, Dexamethasone and Ondansetron  Airway Management Planned: Oral ETT  Additional Equipment:   Intra-op Plan:   Post-operative Plan: Extubation in OR  Informed Consent: I have reviewed the patients History and Physical, chart, labs and discussed the procedure including the risks, benefits and alternatives for the proposed anesthesia with the patient or authorized representative who has indicated his/her understanding and acceptance.     Dental advisory given  Plan Discussed with: CRNA  Anesthesia Plan Comments: (See PAT note from 8/13 by Sherlie Ban PA-C  2 IVs)        Anesthesia Quick Evaluation

## 2023-03-12 ENCOUNTER — Telehealth: Payer: Self-pay | Admitting: Family Medicine

## 2023-03-12 NOTE — Telephone Encounter (Signed)
Erroneous encounter. Please disregard.

## 2023-03-13 ENCOUNTER — Encounter (HOSPITAL_COMMUNITY): Payer: Self-pay | Admitting: General Surgery

## 2023-03-13 ENCOUNTER — Other Ambulatory Visit: Payer: Self-pay

## 2023-03-13 ENCOUNTER — Inpatient Hospital Stay (HOSPITAL_COMMUNITY)
Admission: RE | Admit: 2023-03-13 | Discharge: 2023-03-16 | DRG: 660 | Disposition: A | Discharge status: Home / Self Care | Payer: 59 | Attending: General Surgery | Admitting: General Surgery

## 2023-03-13 ENCOUNTER — Inpatient Hospital Stay (HOSPITAL_COMMUNITY): Payer: 59

## 2023-03-13 ENCOUNTER — Inpatient Hospital Stay (HOSPITAL_COMMUNITY): Payer: 59 | Admitting: Medical

## 2023-03-13 ENCOUNTER — Encounter (HOSPITAL_COMMUNITY)
Admission: RE | Disposition: A | Discharge status: Home / Self Care | Payer: Self-pay | Source: Home / Self Care | Attending: General Surgery

## 2023-03-13 DIAGNOSIS — K5790 Diverticulosis of intestine, part unspecified, without perforation or abscess without bleeding: Secondary | ICD-10-CM | POA: Diagnosis present

## 2023-03-13 DIAGNOSIS — G4733 Obstructive sleep apnea (adult) (pediatric): Secondary | ICD-10-CM | POA: Diagnosis present

## 2023-03-13 DIAGNOSIS — N184 Chronic kidney disease, stage 4 (severe): Secondary | ICD-10-CM

## 2023-03-13 DIAGNOSIS — N189 Chronic kidney disease, unspecified: Secondary | ICD-10-CM | POA: Diagnosis present

## 2023-03-13 DIAGNOSIS — F419 Anxiety disorder, unspecified: Secondary | ICD-10-CM | POA: Diagnosis present

## 2023-03-13 DIAGNOSIS — F32A Depression, unspecified: Secondary | ICD-10-CM | POA: Diagnosis present

## 2023-03-13 DIAGNOSIS — I129 Hypertensive chronic kidney disease with stage 1 through stage 4 chronic kidney disease, or unspecified chronic kidney disease: Secondary | ICD-10-CM

## 2023-03-13 DIAGNOSIS — Z8249 Family history of ischemic heart disease and other diseases of the circulatory system: Secondary | ICD-10-CM | POA: Diagnosis not present

## 2023-03-13 DIAGNOSIS — Z833 Family history of diabetes mellitus: Secondary | ICD-10-CM | POA: Diagnosis not present

## 2023-03-13 DIAGNOSIS — Z800 Family history of malignant neoplasm of digestive organs: Secondary | ICD-10-CM

## 2023-03-13 DIAGNOSIS — Z888 Allergy status to other drugs, medicaments and biological substances status: Secondary | ICD-10-CM

## 2023-03-13 DIAGNOSIS — E119 Type 2 diabetes mellitus without complications: Secondary | ICD-10-CM

## 2023-03-13 DIAGNOSIS — Z8711 Personal history of peptic ulcer disease: Secondary | ICD-10-CM | POA: Diagnosis not present

## 2023-03-13 DIAGNOSIS — K5720 Diverticulitis of large intestine with perforation and abscess without bleeding: Secondary | ICD-10-CM | POA: Diagnosis present

## 2023-03-13 DIAGNOSIS — N321 Vesicointestinal fistula: Secondary | ICD-10-CM

## 2023-03-13 DIAGNOSIS — Z87891 Personal history of nicotine dependence: Secondary | ICD-10-CM

## 2023-03-13 DIAGNOSIS — I10 Essential (primary) hypertension: Secondary | ICD-10-CM

## 2023-03-13 DIAGNOSIS — Z01812 Encounter for preprocedural laboratory examination: Secondary | ICD-10-CM | POA: Diagnosis not present

## 2023-03-13 DIAGNOSIS — R001 Bradycardia, unspecified: Secondary | ICD-10-CM | POA: Diagnosis present

## 2023-03-13 DIAGNOSIS — K219 Gastro-esophageal reflux disease without esophagitis: Secondary | ICD-10-CM | POA: Diagnosis present

## 2023-03-13 DIAGNOSIS — K5909 Other constipation: Secondary | ICD-10-CM | POA: Diagnosis present

## 2023-03-13 DIAGNOSIS — E1122 Type 2 diabetes mellitus with diabetic chronic kidney disease: Secondary | ICD-10-CM

## 2023-03-13 DIAGNOSIS — Z91040 Latex allergy status: Secondary | ICD-10-CM

## 2023-03-13 DIAGNOSIS — Z79899 Other long term (current) drug therapy: Secondary | ICD-10-CM

## 2023-03-13 DIAGNOSIS — Z880 Allergy status to penicillin: Secondary | ICD-10-CM

## 2023-03-13 DIAGNOSIS — Z01818 Encounter for other preprocedural examination: Secondary | ICD-10-CM

## 2023-03-13 DIAGNOSIS — D631 Anemia in chronic kidney disease: Secondary | ICD-10-CM | POA: Diagnosis present

## 2023-03-13 DIAGNOSIS — Z9049 Acquired absence of other specified parts of digestive tract: Secondary | ICD-10-CM | POA: Diagnosis not present

## 2023-03-13 DIAGNOSIS — E785 Hyperlipidemia, unspecified: Secondary | ICD-10-CM | POA: Diagnosis present

## 2023-03-13 DIAGNOSIS — D649 Anemia, unspecified: Principal | ICD-10-CM

## 2023-03-13 LAB — ABO/RH: ABO/RH(D): B POS

## 2023-03-13 SURGERY — COLECTOMY, PARTIAL, ROBOT-ASSISTED, LAPAROSCOPIC
Anesthesia: General

## 2023-03-13 MED ORDER — DIPHENHYDRAMINE HCL 12.5 MG/5ML PO ELIX: 12.5000 mg | ORAL_SOLUTION | Freq: Four times a day (QID) | ORAL | Status: DC | PRN

## 2023-03-13 MED ORDER — SERTRALINE HCL 100 MG PO TABS
100.0000 mg | ORAL_TABLET | Freq: Every day | ORAL | Status: DC
  Administered 2023-03-14 – 2023-03-16 (×3): 100 mg via ORAL
  Filled 2023-03-13 (×3): qty 1

## 2023-03-13 MED ORDER — DEXAMETHASONE SODIUM PHOSPHATE 10 MG/ML IJ SOLN
INTRAMUSCULAR | Status: DC | PRN
  Administered 2023-03-13: 8 mg via INTRAVENOUS

## 2023-03-13 MED ORDER — ROCURONIUM BROMIDE 100 MG/10ML IV SOLN
INTRAVENOUS | Status: DC | PRN
  Administered 2023-03-13: 100 mg via INTRAVENOUS

## 2023-03-13 MED ORDER — ALVIMOPAN 12 MG PO CAPS
12.0000 mg | ORAL_CAPSULE | ORAL | Status: AC
  Administered 2023-03-13: 12 mg via ORAL
  Filled 2023-03-13: qty 1

## 2023-03-13 MED ORDER — HYDROMORPHONE HCL 1 MG/ML IJ SOLN
0.5000 mg | INTRAMUSCULAR | Status: DC | PRN
  Administered 2023-03-13 – 2023-03-16 (×11): 0.5 mg via INTRAVENOUS
  Filled 2023-03-13 (×12): qty 0.5

## 2023-03-13 MED ORDER — LACTATED RINGERS IV SOLN: INTRAVENOUS | Status: DC

## 2023-03-13 MED ORDER — 0.9 % SODIUM CHLORIDE (POUR BTL) OPTIME
TOPICAL | Status: DC | PRN
  Administered 2023-03-13: 2000 mL

## 2023-03-13 MED ORDER — FENTANYL CITRATE (PF) 250 MCG/5ML IJ SOLN
INTRAMUSCULAR | Status: AC
  Filled 2023-03-13: qty 5

## 2023-03-13 MED ORDER — LAMOTRIGINE 100 MG PO TABS
200.0000 mg | ORAL_TABLET | Freq: Two times a day (BID) | ORAL | Status: DC
  Administered 2023-03-13 – 2023-03-16 (×6): 200 mg via ORAL
  Filled 2023-03-13 (×6): qty 2

## 2023-03-13 MED ORDER — PHENYLEPHRINE HCL-NACL 20-0.9 MG/250ML-% IV SOLN
INTRAVENOUS | Status: DC | PRN
  Administered 2023-03-13: 50 ug/min via INTRAVENOUS

## 2023-03-13 MED ORDER — DIPHENHYDRAMINE HCL 50 MG/ML IJ SOLN: 12.5000 mg | Freq: Four times a day (QID) | INTRAMUSCULAR | Status: DC | PRN

## 2023-03-13 MED ORDER — SACCHAROMYCES BOULARDII 250 MG PO CAPS
250.0000 mg | ORAL_CAPSULE | Freq: Two times a day (BID) | ORAL | Status: DC
  Administered 2023-03-13 – 2023-03-16 (×6): 250 mg via ORAL
  Filled 2023-03-13 (×6): qty 1

## 2023-03-13 MED ORDER — ATORVASTATIN CALCIUM 20 MG PO TABS
20.0000 mg | ORAL_TABLET | Freq: Every day | ORAL | Status: DC
  Administered 2023-03-13 – 2023-03-16 (×4): 20 mg via ORAL
  Filled 2023-03-13 (×4): qty 1

## 2023-03-13 MED ORDER — ROCURONIUM BROMIDE 10 MG/ML (PF) SYRINGE
PREFILLED_SYRINGE | INTRAVENOUS | Status: AC
  Filled 2023-03-13: qty 10

## 2023-03-13 MED ORDER — METHYLENE BLUE (ANTIDOTE) 1 % IV SOLN
INTRAVENOUS | Status: AC
  Filled 2023-03-13: qty 10

## 2023-03-13 MED ORDER — HYDRALAZINE HCL 20 MG/ML IJ SOLN
INTRAMUSCULAR | Status: AC
  Filled 2023-03-13: qty 1

## 2023-03-13 MED ORDER — METHYLENE BLUE (ANTIDOTE) 1 % IV SOLN
INTRAVENOUS | Status: DC | PRN
  Administered 2023-03-13: 10 mL

## 2023-03-13 MED ORDER — MIDAZOLAM HCL 5 MG/5ML IJ SOLN
INTRAMUSCULAR | Status: DC | PRN
  Administered 2023-03-13: 2 mg via INTRAVENOUS

## 2023-03-13 MED ORDER — LOSARTAN POTASSIUM 25 MG PO TABS
25.0000 mg | ORAL_TABLET | Freq: Every day | ORAL | Status: DC
  Administered 2023-03-14 – 2023-03-16 (×3): 25 mg via ORAL
  Filled 2023-03-13 (×3): qty 1

## 2023-03-13 MED ORDER — BUPIVACAINE HCL (PF) 0.25 % IJ SOLN
INTRAMUSCULAR | Status: AC
  Filled 2023-03-13: qty 30

## 2023-03-13 MED ORDER — ACETAMINOPHEN 500 MG PO TABS: 1000.0000 mg | ORAL_TABLET | Freq: Once | ORAL | Status: AC

## 2023-03-13 MED ORDER — KCL IN DEXTROSE-NACL 20-5-0.45 MEQ/L-%-% IV SOLN
INTRAVENOUS | Status: AC
  Filled 2023-03-13 (×2): qty 1000

## 2023-03-13 MED ORDER — FENTANYL CITRATE (PF) 250 MCG/5ML IJ SOLN
INTRAMUSCULAR | Status: DC | PRN
  Administered 2023-03-13 (×3): 100 ug via INTRAVENOUS
  Administered 2023-03-13: 50 ug via INTRAVENOUS

## 2023-03-13 MED ORDER — LIDOCAINE HCL (PF) 2 % IJ SOLN
INTRAMUSCULAR | Status: AC
  Filled 2023-03-13: qty 5

## 2023-03-13 MED ORDER — ONDANSETRON HCL 4 MG/2ML IJ SOLN
INTRAMUSCULAR | Status: AC
  Filled 2023-03-13: qty 2

## 2023-03-13 MED ORDER — ACETAMINOPHEN 500 MG PO TABS
ORAL_TABLET | ORAL | Status: AC
  Administered 2023-03-13: 1000 mg via ORAL
  Filled 2023-03-13: qty 2

## 2023-03-13 MED ORDER — FENTANYL CITRATE PF 50 MCG/ML IJ SOSY: 25.0000 ug | PREFILLED_SYRINGE | INTRAMUSCULAR | Status: DC | PRN

## 2023-03-13 MED ORDER — GABAPENTIN 100 MG PO CAPS
300.0000 mg | ORAL_CAPSULE | Freq: Two times a day (BID) | ORAL | Status: DC
  Administered 2023-03-13 – 2023-03-16 (×6): 300 mg via ORAL
  Filled 2023-03-13 (×6): qty 3

## 2023-03-13 MED ORDER — ORAL CARE MOUTH RINSE: 15.0000 mL | Freq: Once | OROMUCOSAL | Status: AC

## 2023-03-13 MED ORDER — ONDANSETRON HCL 4 MG PO TABS: 4.0000 mg | ORAL_TABLET | Freq: Four times a day (QID) | ORAL | Status: DC | PRN

## 2023-03-13 MED ORDER — SIMETHICONE 80 MG PO CHEW
40.0000 mg | CHEWABLE_TABLET | Freq: Four times a day (QID) | ORAL | Status: DC | PRN
  Administered 2023-03-13: 40 mg via ORAL
  Filled 2023-03-13: qty 1

## 2023-03-13 MED ORDER — RINGERS IRRIGATION IR SOLN
Status: DC | PRN
  Administered 2023-03-13: 1000 mL

## 2023-03-13 MED ORDER — PROPOFOL 10 MG/ML IV BOLUS
INTRAVENOUS | Status: DC | PRN
Start: 2023-03-13 — End: 2023-03-13
  Administered 2023-03-13: 50 mg via INTRAVENOUS
  Administered 2023-03-13: 30 mg via INTRAVENOUS
  Administered 2023-03-13: 120 mg via INTRAVENOUS

## 2023-03-13 MED ORDER — ALVIMOPAN 12 MG PO CAPS
12.0000 mg | ORAL_CAPSULE | Freq: Two times a day (BID) | ORAL | Status: DC
  Administered 2023-03-14: 12 mg via ORAL
  Filled 2023-03-13 (×2): qty 1

## 2023-03-13 MED ORDER — BUPIVACAINE LIPOSOME 1.3 % IJ SUSP
INTRAMUSCULAR | Status: AC
  Filled 2023-03-13: qty 20

## 2023-03-13 MED ORDER — KETAMINE HCL 50 MG/5ML IJ SOSY
PREFILLED_SYRINGE | INTRAMUSCULAR | Status: AC
  Filled 2023-03-13: qty 5

## 2023-03-13 MED ORDER — PROPOFOL 10 MG/ML IV BOLUS
INTRAVENOUS | Status: AC
  Filled 2023-03-13: qty 20

## 2023-03-13 MED ORDER — BUPIVACAINE LIPOSOME 1.3 % IJ SUSP
INTRAMUSCULAR | Status: DC | PRN
  Administered 2023-03-13: 50 mL

## 2023-03-13 MED ORDER — BUPIVACAINE LIPOSOME 1.3 % IJ SUSP: 20.0000 mL | Freq: Once | INTRAMUSCULAR | Status: DC

## 2023-03-13 MED ORDER — SODIUM CHLORIDE 0.9 % IR SOLN
Status: DC | PRN
  Administered 2023-03-13: 1000 mL

## 2023-03-13 MED ORDER — ENSURE PRE-SURGERY PO LIQD
592.0000 mL | Freq: Once | ORAL | Status: DC
  Filled 2023-03-13: qty 592

## 2023-03-13 MED ORDER — LABETALOL HCL 5 MG/ML IV SOLN
INTRAVENOUS | Status: DC | PRN
  Administered 2023-03-13 (×2): 5 mg via INTRAVENOUS

## 2023-03-13 MED ORDER — LIDOCAINE HCL 2 % IJ SOLN
INTRAMUSCULAR | Status: AC
  Filled 2023-03-13: qty 20

## 2023-03-13 MED ORDER — DEXAMETHASONE SODIUM PHOSPHATE 10 MG/ML IJ SOLN
INTRAMUSCULAR | Status: AC
  Filled 2023-03-13: qty 1

## 2023-03-13 MED ORDER — POLYETHYLENE GLYCOL 3350 17 GM/SCOOP PO POWD
1.0000 | Freq: Once | ORAL | Status: DC
  Filled 2023-03-13: qty 255

## 2023-03-13 MED ORDER — KETAMINE HCL 10 MG/ML IJ SOLN
INTRAMUSCULAR | Status: DC | PRN
  Administered 2023-03-13: 30 mg via INTRAVENOUS

## 2023-03-13 MED ORDER — SUGAMMADEX SODIUM 200 MG/2ML IV SOLN
INTRAVENOUS | Status: DC | PRN
  Administered 2023-03-13: 200 mg via INTRAVENOUS

## 2023-03-13 MED ORDER — ENSURE PRE-SURGERY PO LIQD
296.0000 mL | Freq: Once | ORAL | Status: DC
  Filled 2023-03-13: qty 296

## 2023-03-13 MED ORDER — LIDOCAINE HCL (CARDIAC) PF 100 MG/5ML IV SOSY
PREFILLED_SYRINGE | INTRAVENOUS | Status: DC | PRN
  Administered 2023-03-13: 60 mg via INTRAVENOUS

## 2023-03-13 MED ORDER — CARVEDILOL 12.5 MG PO TABS
12.5000 mg | ORAL_TABLET | Freq: Two times a day (BID) | ORAL | Status: DC
  Administered 2023-03-14 – 2023-03-16 (×4): 12.5 mg via ORAL
  Filled 2023-03-13 (×4): qty 1

## 2023-03-13 MED ORDER — ONDANSETRON HCL 4 MG/2ML IJ SOLN
INTRAMUSCULAR | Status: DC | PRN
  Administered 2023-03-13: 4 mg via INTRAVENOUS

## 2023-03-13 MED ORDER — STERILE WATER FOR INJECTION IJ SOLN
INTRAMUSCULAR | Status: DC | PRN
  Administered 2023-03-13: 15 mL via INTRAMUSCULAR

## 2023-03-13 MED ORDER — CHLORTHALIDONE 25 MG PO TABS
12.5000 mg | ORAL_TABLET | Freq: Every day | ORAL | Status: DC
  Administered 2023-03-13 – 2023-03-16 (×4): 12.5 mg via ORAL
  Filled 2023-03-13 (×4): qty 1

## 2023-03-13 MED ORDER — ENSURE SURGERY PO LIQD
237.0000 mL | Freq: Two times a day (BID) | ORAL | Status: DC
  Administered 2023-03-15 – 2023-03-16 (×2): 237 mL via ORAL

## 2023-03-13 MED ORDER — LABETALOL HCL 5 MG/ML IV SOLN
INTRAVENOUS | Status: AC
  Filled 2023-03-13: qty 4

## 2023-03-13 MED ORDER — BISACODYL 5 MG PO TBEC: 20.0000 mg | DELAYED_RELEASE_TABLET | Freq: Once | ORAL | Status: DC

## 2023-03-13 MED ORDER — FENTANYL CITRATE (PF) 100 MCG/2ML IJ SOLN
INTRAMUSCULAR | Status: AC
  Filled 2023-03-13: qty 2

## 2023-03-13 MED ORDER — PANTOPRAZOLE SODIUM 40 MG PO TBEC
40.0000 mg | DELAYED_RELEASE_TABLET | Freq: Two times a day (BID) | ORAL | Status: DC
  Administered 2023-03-13 – 2023-03-16 (×6): 40 mg via ORAL
  Filled 2023-03-13 (×6): qty 1

## 2023-03-13 MED ORDER — ALUM & MAG HYDROXIDE-SIMETH 200-200-20 MG/5ML PO SUSP: 30.0000 mL | Freq: Four times a day (QID) | ORAL | Status: DC | PRN

## 2023-03-13 MED ORDER — ONDANSETRON HCL 4 MG/2ML IJ SOLN: 4.0000 mg | Freq: Four times a day (QID) | INTRAMUSCULAR | Status: DC | PRN

## 2023-03-13 MED ORDER — ENOXAPARIN SODIUM 40 MG/0.4ML IJ SOSY
40.0000 mg | PREFILLED_SYRINGE | INTRAMUSCULAR | Status: DC
  Administered 2023-03-14 – 2023-03-16 (×3): 40 mg via SUBCUTANEOUS
  Filled 2023-03-13 (×3): qty 0.4

## 2023-03-13 MED ORDER — STERILE WATER FOR INJECTION IJ SOLN
INTRAMUSCULAR | Status: AC
  Filled 2023-03-13: qty 10

## 2023-03-13 MED ORDER — CHLORHEXIDINE GLUCONATE 0.12 % MT SOLN
15.0000 mL | Freq: Once | OROMUCOSAL | Status: AC
  Administered 2023-03-13: 15 mL via OROMUCOSAL

## 2023-03-13 MED ORDER — SODIUM CHLORIDE 0.9 % IV SOLN
1.0000 g | INTRAVENOUS | Status: DC
  Filled 2023-03-13: qty 1000

## 2023-03-13 MED ORDER — ACETAMINOPHEN 325 MG PO TABS: 650.0000 mg | ORAL_TABLET | Freq: Once | ORAL | Status: DC

## 2023-03-13 MED ORDER — LIDOCAINE HCL (PF) 2 % IJ SOLN
INTRAMUSCULAR | Status: DC | PRN
  Administered 2023-03-13: 1.5 mg/kg/h

## 2023-03-13 MED ORDER — LACTATED RINGERS IV SOLN: INTRAVENOUS | Status: DC | PRN

## 2023-03-13 MED ORDER — HYDRALAZINE HCL 20 MG/ML IJ SOLN
INTRAMUSCULAR | Status: DC | PRN
  Administered 2023-03-13: 5 mg via INTRAVENOUS

## 2023-03-13 MED ORDER — INDOCYANINE GREEN 25 MG IV SOLR
INTRAVENOUS | Status: DC | PRN
  Administered 2023-03-13: 5 mg via INTRAVENOUS

## 2023-03-13 MED ORDER — MIDAZOLAM HCL 2 MG/2ML IJ SOLN
INTRAMUSCULAR | Status: AC
  Filled 2023-03-13: qty 2

## 2023-03-13 SURGICAL SUPPLY — 94 items
ADAPTER GOLDBERG URETERAL (ADAPTER) IMPLANT
ADPR CATH 15X14FR FL DRN BG (ADAPTER)
BAG COUNTER SPONGE SURGICOUNT (BAG) ×1 IMPLANT
BAG SPNG CNTER NS LX DISP (BAG) ×1
BAG URO CATCHER STRL LF (MISCELLANEOUS) ×1 IMPLANT
BLADE EXTENDED COATED 6.5IN (ELECTRODE) IMPLANT
CANNULA REDUCER 12-8 DVNC XI (CANNULA) IMPLANT
CATH URETL OPEN 5X70 (CATHETERS) IMPLANT
CLOTH BEACON ORANGE TIMEOUT ST (SAFETY) ×1 IMPLANT
COVER SURGICAL LIGHT HANDLE (MISCELLANEOUS) ×2 IMPLANT
COVER TIP SHEARS 8 DVNC (MISCELLANEOUS) ×1 IMPLANT
DRAIN CHANNEL 19F RND (DRAIN) IMPLANT
DRAIN RELI 100 BL SUC LF ST (DRAIN)
DRAPE ARM DVNC X/XI (DISPOSABLE) ×4 IMPLANT
DRAPE COLUMN DVNC XI (DISPOSABLE) ×1 IMPLANT
DRAPE SURG IRRIG POUCH 19X23 (DRAPES) ×1 IMPLANT
DRIVER NDL LRG 8 DVNC XI (INSTRUMENTS) ×1 IMPLANT
DRIVER NDLE LRG 8 DVNC XI (INSTRUMENTS) ×1
DRSG OPSITE POSTOP 4X10 (GAUZE/BANDAGES/DRESSINGS) IMPLANT
DRSG OPSITE POSTOP 4X6 (GAUZE/BANDAGES/DRESSINGS) IMPLANT
DRSG OPSITE POSTOP 4X8 (GAUZE/BANDAGES/DRESSINGS) IMPLANT
ELECT PENCIL ROCKER SW 15FT (MISCELLANEOUS) ×1 IMPLANT
ELECT REM PT RETURN 15FT ADLT (MISCELLANEOUS) ×1 IMPLANT
ENDOLOOP SUT PDS II 0 18 (SUTURE) IMPLANT
EVACUATOR SILICONE 100CC (DRAIN) IMPLANT
GLOVE BIO SURGEON STRL SZ 6.5 (GLOVE) ×3 IMPLANT
GLOVE INDICATOR 6.5 STRL GRN (GLOVE) ×3 IMPLANT
GLOVE SURG LX STRL 7.5 STRW (GLOVE) ×1 IMPLANT
GOWN SRG XL LVL 4 BRTHBL STRL (GOWNS) ×1 IMPLANT
GOWN STRL NON-REIN XL LVL4 (GOWNS) ×1
GOWN STRL REUS W/ TWL XL LVL3 (GOWN DISPOSABLE) ×3 IMPLANT
GOWN STRL REUS W/TWL XL LVL3 (GOWN DISPOSABLE) ×3
GRASPER SUT TROCAR 14GX15 (MISCELLANEOUS) IMPLANT
GRASPER TIP-UP FEN DVNC XI (INSTRUMENTS) ×1 IMPLANT
GUIDEWIRE ANG ZIPWIRE 038X150 (WIRE) IMPLANT
GUIDEWIRE STR DUAL SENSOR (WIRE) IMPLANT
HOLDER FOLEY CATH W/STRAP (MISCELLANEOUS) ×1 IMPLANT
IRRIG SUCT STRYKERFLOW 2 WTIP (MISCELLANEOUS) ×1
IRRIGATION SUCT STRKRFLW 2 WTP (MISCELLANEOUS) ×1 IMPLANT
KIT PROCEDURE DVNC SI (MISCELLANEOUS) ×1 IMPLANT
KIT TURNOVER KIT A (KITS) IMPLANT
MANIFOLD NEPTUNE II (INSTRUMENTS) ×1 IMPLANT
NDL INSUFFLATION 14GA 120MM (NEEDLE) ×1 IMPLANT
NEEDLE INSUFFLATION 14GA 120MM (NEEDLE) ×1
PACK CARDIOVASCULAR III (CUSTOM PROCEDURE TRAY) ×1 IMPLANT
PACK COLON (CUSTOM PROCEDURE TRAY) ×1 IMPLANT
PACK CYSTO (CUSTOM PROCEDURE TRAY) ×1 IMPLANT
PAD POSITIONING PINK XL (MISCELLANEOUS) ×1 IMPLANT
RELOAD STAPLE 60 3.5 BLU DVNC (STAPLE) IMPLANT
RELOAD STAPLE 60 4.3 GRN DVNC (STAPLE) IMPLANT
RETRACTOR WND ALEXIS 18 MED (MISCELLANEOUS) IMPLANT
RTRCTR WOUND ALEXIS 18CM MED (MISCELLANEOUS)
SCISSORS LAP 5X35 DISP (ENDOMECHANICALS) IMPLANT
SCISSORS MNPLR CVD DVNC XI (INSTRUMENTS) ×1 IMPLANT
SEAL UNIV 5-12 XI (MISCELLANEOUS) ×3 IMPLANT
SEALER VESSEL EXT DVNC XI (MISCELLANEOUS) ×1 IMPLANT
SOL ELECTROSURG ANTI STICK (MISCELLANEOUS) ×1
SOLUTION ELECTROSURG ANTI STCK (MISCELLANEOUS) ×1 IMPLANT
SPIKE FLUID TRANSFER (MISCELLANEOUS) IMPLANT
STAPLER 60 SUREFORM DVNC (STAPLE) IMPLANT
STAPLER ECHELON POWER CIR 29 (STAPLE) IMPLANT
STAPLER ECHELON POWER CIR 31 (STAPLE) IMPLANT
STAPLER RELOAD 3.5X60 BLU DVNC (STAPLE)
STAPLER RELOAD 4.3X60 GRN DVNC (STAPLE) ×1
STOPCOCK 4 WAY LG BORE MALE ST (IV SETS) ×2 IMPLANT
SUT ETHILON 2 0 PS N (SUTURE) IMPLANT
SUT NOVA NAB GS-21 1 T12 (SUTURE) ×2 IMPLANT
SUT PROLENE 2 0 KS (SUTURE) IMPLANT
SUT SILK 2 0 (SUTURE) ×1
SUT SILK 2 0 SH CR/8 (SUTURE) IMPLANT
SUT SILK 2-0 18XBRD TIE 12 (SUTURE) ×1 IMPLANT
SUT SILK 3 0 (SUTURE)
SUT SILK 3 0 SH CR/8 (SUTURE) ×1 IMPLANT
SUT SILK 3-0 18XBRD TIE 12 (SUTURE) IMPLANT
SUT V-LOC BARB 180 2/0GR6 GS22 (SUTURE)
SUT VIC AB 2-0 SH 18 (SUTURE) IMPLANT
SUT VIC AB 2-0 SH 27 (SUTURE)
SUT VIC AB 2-0 SH 27X BRD (SUTURE) IMPLANT
SUT VIC AB 3-0 SH 18 (SUTURE) IMPLANT
SUT VIC AB 4-0 PS2 27 (SUTURE) ×2 IMPLANT
SUT VICRYL 0 UR6 27IN ABS (SUTURE) ×1 IMPLANT
SUTURE V-LC BRB 180 2/0GR6GS22 (SUTURE) IMPLANT
SYR 20ML ECCENTRIC (SYRINGE) ×1 IMPLANT
SYS LAPSCP GELPORT 120MM (MISCELLANEOUS)
SYS WOUND ALEXIS 18CM MED (MISCELLANEOUS)
SYSTEM LAPSCP GELPORT 120MM (MISCELLANEOUS) IMPLANT
SYSTEM WOUND ALEXIS 18CM MED (MISCELLANEOUS) IMPLANT
TOWEL OR 17X26 10 PK STRL BLUE (TOWEL DISPOSABLE) IMPLANT
TOWEL OR NON WOVEN STRL DISP B (DISPOSABLE) ×1 IMPLANT
TRAY FOLEY MTR SLVR 16FR STAT (SET/KITS/TRAYS/PACK) ×1 IMPLANT
TROCAR ADV FIXATION 5X100MM (TROCAR) ×1 IMPLANT
TUBING CONNECTING 10 (TUBING) ×3 IMPLANT
TUBING INSUFFLATION 10FT LAP (TUBING) ×1 IMPLANT
TUBING UROLOGY SET (TUBING) IMPLANT

## 2023-03-13 NOTE — H&P (Signed)
I was asked to do cystoscopy with firefly injection prior to her colectomy for a colovescial fistula..  I have reviewed her chart, labs and imaging.   I discussed the procedure and reviewed the risks of bleeding, infection and ureteral injury.   Imp: Colovesical fistula.  Plan: Cystoscopy with firefly.

## 2023-03-13 NOTE — Discharge Instructions (Signed)
SURGERY: POST OP INSTRUCTIONS (Surgery for small bowel obstruction, colon resection, etc)   ######################################################################  EAT Gradually transition to a high fiber diet with a fiber supplement over the next few days after discharge  WALK Walk an hour a day.  Control your pain to do that.    CONTROL PAIN Control pain so that you can walk, sleep, tolerate sneezing/coughing, go up/down stairs.  HAVE A BOWEL MOVEMENT DAILY Keep your bowels regular to avoid problems.  OK to try a laxative to override constipation.  OK to use an antidairrheal to slow down diarrhea.  Call if not better after 2 tries  CALL IF YOU HAVE PROBLEMS/CONCERNS Call if you are still struggling despite following these instructions. Call if you have concerns not answered by these instructions  ######################################################################   DIET Follow a light diet the first few days at home.  Start with a bland diet such as soups, liquids, starchy foods, low fat foods, etc.  If you feel full, bloated, or constipated, stay on a ful liquid or pureed/blenderized diet for a few days until you feel better and no longer constipated. Be sure to drink plenty of fluids every day to avoid getting dehydrated (feeling dizzy, not urinating, etc.). Gradually add a fiber supplement to your diet over the next week.  Gradually get back to a regular solid diet.  Avoid fast food or heavy meals the first week as you are more likely to get nauseated. It is expected for your digestive tract to need a few months to get back to normal.  It is common for your bowel movements and stools to be irregular.  You will have occasional bloating and cramping that should eventually fade away.  Until you are eating solid food normally, off all pain medications, and back to regular activities; your bowels will not be normal. Focus on eating a low-fat, high fiber diet the rest of your life  (See Getting to Good Bowel Health, below).  CARE of your INCISION or WOUND  It is good for closed incisions and even open wounds to be washed every day.  Shower every day.  Short baths are fine.  Wash the incisions and wounds clean with soap & water.    You may leave closed incisions open to air if it is dry.   You may cover the incision with clean gauze & replace it after your daily shower for comfort.  STAPLES: You have skin staples.  Leave them in place & set up an appointment for them to be removed by a surgery office nurse ~10 days after surgery. = 1st week of January 2024    ACTIVITIES as tolerated Start light daily activities --- self-care, walking, climbing stairs-- beginning the day after surgery.  Gradually increase activities as tolerated.  Control your pain to be active.  Stop when you are tired.  Ideally, walk several times a day, eventually an hour a day.   Most people are back to most day-to-day activities in a few weeks.  It takes 4-8 weeks to get back to unrestricted, intense activity. If you can walk 30 minutes without difficulty, it is safe to try more intense activity such as jogging, treadmill, bicycling, low-impact aerobics, swimming, etc. Save the most intensive and strenuous activity for last (Usually 4-8 weeks after surgery) such as sit-ups, heavy lifting, contact sports, etc.  Refrain from any intense heavy lifting or straining until you are off narcotics for pain control.  You will have off days, but things should improve   week-by-week. DO NOT PUSH THROUGH PAIN.  Let pain be your guide: If it hurts to do something, don't do it.  Pain is your body warning you to avoid that activity for another week until the pain goes down. You may drive when you are no longer taking narcotic prescription pain medication, you can comfortably wear a seatbelt, and you can safely make sudden turns/stops to protect yourself without hesitating due to pain. You may have sexual intercourse when it  is comfortable. If it hurts to do something, stop.  MEDICATIONS Take your usually prescribed home medications unless otherwise directed.   Blood thinners:  Usually you can restart any strong blood thinners after the second postoperative day.  It is OK to take aspirin right away.     If you are on strong blood thinners (warfarin/Coumadin, Plavix, Xerelto, Eliquis, Pradaxa, etc), discuss with your surgeon, medicine PCP, and/or cardiologist for instructions on when to restart the blood thinner & if blood monitoring is needed (PT/INR blood check, etc).     PAIN CONTROL Pain after surgery or related to activity is often due to strain/injury to muscle, tendon, nerves and/or incisions.  This pain is usually short-term and will improve in a few months.  To help speed the process of healing and to get back to regular activity more quickly, DO THE FOLLOWING THINGS TOGETHER: Increase activity gradually.  DO NOT PUSH THROUGH PAIN Use Ice and/or Heat Try Gentle Massage and/or Stretching Take over the counter pain medication Take Narcotic prescription pain medication for more severe pain  Good pain control = faster recovery.  It is better to take more medicine to be more active than to stay in bed all day to avoid medications.  Increase activity gradually Avoid heavy lifting at first, then increase to lifting as tolerated over the next 6 weeks. Do not "push through" the pain.  Listen to your body and avoid positions and maneuvers than reproduce the pain.  Wait a few days before trying something more intense Walking an hour a day is encouraged to help your body recover faster and more safely.  Start slowly and stop when getting sore.  If you can walk 30 minutes without stopping or pain, you can try more intense activity (running, jogging, aerobics, cycling, swimming, treadmill, sex, sports, weightlifting, etc.) Remember: If it hurts to do it, then don't do it! Use Ice and/or Heat You will have swelling and  bruising around the incisions.  This will take several weeks to resolve. Ice packs or heating pads (6-8 times a day, 30-60 minutes at a time) will help sooth soreness & bruising. Some people prefer to use ice alone, heat alone, or alternate between ice & heat.  Experiment and see what works best for you.  Consider trying ice for the first few days to help decrease swelling and bruising; then, switch to heat to help relax sore spots and speed recovery. Shower every day.  Short baths are fine.  It feels good!  Keep the incisions and wounds clean with soap & water.   Try Gentle Massage and/or Stretching Massage at the area of pain many times a day Stop if you feel pain - do not overdo it Take over the counter pain medication This helps the muscle and nerve tissues become less irritable and calm down faster Choose ONE of the following over-the-counter anti-inflammatory medications: Acetaminophen 500mg tabs (Tylenol) 1-2 pills with every meal and just before bedtime (avoid if you have liver problems or if you have   acetaminophen in you narcotic prescription) Naproxen 220mg tabs (ex. Aleve, Naprosyn) 1-2 pills twice a day (avoid if you have kidney, stomach, IBD, or bleeding problems) Ibuprofen 200mg tabs (ex. Advil, Motrin) 3-4 pills with every meal and just before bedtime (avoid if you have kidney, stomach, IBD, or bleeding problems) Take with food/snack several times a day as directed for at least 2 weeks to help keep pain / soreness down & more manageable. Take Narcotic prescription pain medication for more severe pain A prescription for strong pain control is often given to you upon discharge (for example: oxycodone/Percocet, hydrocodone/Norco/Vicodin, or tramadol/Ultram) Take your pain medication as prescribed. Be mindful that most narcotic prescriptions contain Tylenol (acetaminophen) as well - avoid taking too much Tylenol. If you are having problems/concerns with the prescription medicine (does  not control pain, nausea, vomiting, rash, itching, etc.), please call us [phone] to see if we need to switch you to a different pain medicine that will work better for you and/or control your side effects better. If you need a refill on your pain medication, you must call the office before 4 pm and on weekdays only.  By federal law, prescriptions for narcotics cannot be called into a pharmacy.  They must be filled out on paper & picked up from our office by the patient or authorized caretaker.  Prescriptions cannot be filled after 4 pm nor on weekends.    WHEN TO CALL US [phone] Severe uncontrolled or worsening pain  Fever over 101 F (38.5 C) Concerns with the incision: Worsening pain, redness, rash/hives, swelling, bleeding, or drainage Reactions / problems with new medications (itching, rash, hives, nausea, etc.) Nausea and/or vomiting Difficulty urinating Difficulty breathing Worsening fatigue, dizziness, lightheadedness, blurred vision Other concerns If you are not getting better after two weeks or are noticing you are getting worse, contact our office [phone] for further advice.  We may need to adjust your medications, re-evaluate you in the office, send you to the emergency room, or see what other things we can do to help. The clinic staff is available to answer your questions during regular business hours (8:30am-5pm).  Please don't hesitate to call and ask to speak to one of our nurses for clinical concerns.    A surgeon from [facility] is always on call at the hospitals 24 hours/day If you have a medical emergency, go to the nearest emergency room or call 911.  FOLLOW UP in our office One the day of your discharge from the hospital (or the next business weekday), please call [facility] to set up or confirm an appointment to see your surgeon in the office for a follow-up appointment.  Usually it is 2-3 weeks after your surgery.   If you  have skin staples at your incision(s), let the office know so we can set up a time in the office for the nurse to remove them (usually around 10 days after surgery). Make sure that you call for appointments the day of discharge (or the next business weekday) from the hospital to ensure a convenient appointment time. IF YOU HAVE DISABILITY OR FAMILY LEAVE FORMS, BRING THEM TO THE OFFICE FOR PROCESSING.  DO NOT GIVE THEM TO YOUR DOCTOR.  [facility] [address], [address], [place], [place]  [address] ? [phone] - Main [phone] - Toll Free,  [phone] - Fax [email or web address]    GETTING TO GOOD BOWEL HEALTH. It is expected for your digestive tract to   need a few months to get back to normal.  It is common for your bowel movements and stools to be irregular.  You will have occasional bloating and cramping that should eventually fade away.  Until you are eating solid food normally, off all pain medications, and back to regular activities; your bowels will not be normal.   Avoiding constipation The goal: ONE SOFT BOWEL MOVEMENT A DAY!    Drink plenty of fluids.  Choose water first. TAKE A FIBER SUPPLEMENT EVERY DAY THE REST OF YOUR LIFE During your first week back home, gradually add back a fiber supplement every day Experiment which form you can tolerate.   There are many forms such as powders, tablets, wafers, gummies, etc Psyllium bran (Metamucil), methylcellulose (Citrucel), Miralax or Glycolax, Benefiber, Flax Seed.  Adjust the dose week-by-week (1/2 dose/day to 6 doses a day) until you are moving your bowels 1-2 times a day.  Cut back the dose or try a different fiber product if it is giving you problems such as diarrhea or bloating. Sometimes a laxative is needed to help jump-start bowels if constipated until the fiber supplement can help regulate your bowels.  If you are tolerating eating & you are farting, it is okay to try a gentle  laxative such as double dose MiraLax, prune juice, or Milk of Magnesia.  Avoid using laxatives too often. Stool softeners can sometimes help counteract the constipating effects of narcotic pain medicines.  It can also cause diarrhea, so avoid using for too long. If you are still constipated despite taking fiber daily, eating solids, and a few doses of laxatives, call our office. Controlling diarrhea Try drinking liquids and eating bland foods for a few days to avoid stressing your intestines further. Avoid dairy products (especially milk & ice cream) for a short time.  The intestines often can lose the ability to digest lactose when stressed. Avoid foods that cause gassiness or bloating.  Typical foods include beans and other legumes, cabbage, broccoli, and dairy foods.  Avoid greasy, spicy, fast foods.  Every person has some sensitivity to other foods, so listen to your body and avoid those foods that trigger problems for you. Probiotics (such as active yogurt, Align, etc) may help repopulate the intestines and colon with normal bacteria and calm down a sensitive digestive tract Adding a fiber supplement gradually can help thicken stools by absorbing excess fluid and retrain the intestines to act more normally.  Slowly increase the dose over a few weeks.  Too much fiber too soon can backfire and cause cramping & bloating. It is okay to try and slow down diarrhea with a few doses of antidiarrheal medicines.   Bismuth subsalicylate (ex. Kayopectate, Pepto Bismol) for a few doses can help control diarrhea.  Avoid if pregnant.   Loperamide (Imodium) can slow down diarrhea.  Start with one tablet (2mg) first.  Avoid if you are having fevers or severe pain.  ILEOSTOMY PATIENTS WILL HAVE CHRONIC DIARRHEA since their colon is not in use.    Drink plenty of liquids.  You will need to drink even more glasses of water/liquid a day to avoid getting dehydrated. Record output from your ileostomy.  Expect to empty  the bag every 3-4 hours at first.  Most people with a permanent ileostomy empty their bag 4-6 times at the least.   Use antidiarrheal medicine (especially Imodium) several times a day to avoid getting dehydrated.  Start with a dose at bedtime & breakfast.  Adjust up or   down as needed.  Increase antidiarrheal medications as directed to avoid emptying the bag more than 8 times a day (every 3 hours). Work with your wound ostomy nurse to learn care for your ostomy.  See ostomy care instructions. TROUBLESHOOTING IRREGULAR BOWELS 1) Start with a soft & bland diet. No spicy, greasy, or fried foods.  2) Avoid gluten/wheat or dairy products from diet to see if symptoms improve. 3) Miralax 17gm or flax seed mixed in 8oz. water or juice-daily. May use 2-4 times a day as needed. 4) Gas-X, Phazyme, etc. as needed for gas & bloating.  5) Prilosec (omeprazole) over-the-counter as needed 6)  Consider probiotics (Align, Activa, etc) to help calm the bowels down  Call your doctor if you are getting worse or not getting better.  Sometimes further testing (cultures, endoscopy, X-ray studies, CT scans, bloodwork, etc.) may be needed to help diagnose and treat the cause of the diarrhea. [facility] [address], [address], [place], [place]  [address] [phone] - Main.    [phone]  - Toll Free.   [phone] - Fax [email or web address]   ###############################   #######################################################  Ostomy Support Information  You've heard that people get along just fine with only one of their eyes, or one of their lungs, or one of their kidneys. But you also know that you have only one intestine and only one bladder, and that leaves you feeling awfully empty, both physically and emotionally: You think no other people go around without part of their intestine with the ends of their intestines sticking out through their abdominal walls.    YOU ARE NOT ALONE.  There are nearly three quarters of a million people in the [place] who have an ostomy; people who have had surgery to remove all or part of their colons or bladders.   There is even a national association, the [employer] with over 350 local affiliated support groups that are organized by volunteers who provide peer support and counseling. [employer] has a toll free telephone num-ber, 800-826-0826 and an educational, interactive website, www.ostomy.org   An ostomy is an opening in the belly (abdominal wall) made by surgery. Ostomates are people who have had this procedure. The opening (stoma) allows the kidney or bowel to grdischarge waste. An external pouch covers the stoma to collect waste. Pouches are are a simple bag and are odor free. Different companies have disposable or reusable pouches to fit one's lifestyle. An ostomy can either be temporary or permanent.   THERE ARE THREE MAIN TYPES OF OSTOMIES Colostomy. A colostomy is a surgically created opening in the large intestine (colon). Ileostomy. An ileostomy is a surgically created opening in the small intestine. Urostomy. A urostomy is a surgically created opening to divert urine away from the bladder.  OSTOMY Care  The following guidelines will make care of your colostomy easier. Keep this information close by for quick reference.  Helpful DIET hints Eat a well-balanced diet including vegetables and fresh fruits. Eat on a regular schedule.  Drink at least 6 to 8 glasses of fluids daily. Eat slowly in a relaxed atmosphere. Chew your food thoroughly. Avoid chewing gum, smoking, and drinking from a straw. This will help decrease the amount of air you swallow, which may help reduce gas. Eating yogurt or drinking buttermilk may help reduce gas.  To control gas at night, do not eat after 8 p.m. This will give your bowel time to quiet down before you go   to bed.  If gas is a problem, you can purchase  Beano. Sprinkle Beano on the first bite of food before eating to reduce gas. It has no flavor and should not change the taste of your food. You can buy Beano over the counter at your local drugstore.  Foods like fish, onions, garlic, broccoli, asparagus, and cabbage produce odor. Although your pouch is odor-proof, if you eat these foods you may notice a stronger odor when emptying your pouch. If this is a concern, you may want to limit these foods in your diet.  If you have an ileostomy, you will have chronic diarrhea & need to drink more liquids to avoid getting dehydrated.  Consider antidiarrheal medicine like imodium (loperamide) or Lomotil to help slow down bowel movements / diarrhea into your ileostomy bag.  GETTING TO GOOD BOWEL HEALTH WITH AN ILEOSTOMY    With the colon bypassed & not in use, you will have small bowel diarrhea.   It is important to thicken & slow your bowel movements down.   The goal: 4-6 small BOWEL MOVEMENTS A DAY It is important to drink plenty of liquids to avoid getting dehydrated  CONTROLLING ILEOSTOMY DIARRHEA  TAKE A FIBER SUPPLEMENT (FiberCon or Benefiner soluble fiber) twice a day - to thicken stools by absorbing excess fluid and retrain the intestines to act more normally.  Slowly increase the dose over a few weeks.  Too much fiber too soon can backfire and cause cramping & bloating.  TAKE AN IRON SUPPLEMENT twice a day to naturally constipate your bowels.  Usually ferrous sulfate 325mg twice a day)  TAKE ANTI-DIARRHEAL MEDICINES: Loperamide (Imodium) can slow down diarrhea.  Start with two tablets (= 4mg) first and then try one tablet every 6 hours.  Can go up to 2 pills four times day (8 pills of 2mg max) Avoid if you are having fevers or severe pain.  If you are not better or start feeling worse, stop all medicines and call your doctor for advice LoMotil (Diphenoxylate / Atropine) is another medicine that can constipate & slow down bowel moevements Pepto  Bismol (bismuth) can gently thicken bowels as well  If diarrhea is worse,: drink plenty of liquids and try simpler foods for a few days to avoid stressing your intestines further. Avoid dairy products (especially milk & ice cream) for a short time.  The intestines often can lose the ability to digest lactose when stressed. Avoid foods that cause gassiness or bloating.  Typical foods include beans and other legumes, cabbage, broccoli, and dairy foods.  Every person has some sensitivity to other foods, so listen to our body and avoid those foods that trigger problems for you.Call your doctor if you are getting worse or not better.  Sometimes further testing (cultures, endoscopy, X-ray studies, bloodwork, etc) may be needed to help diagnose and treat the cause of the diarrhea. Take extra anti-diarrheal medicines (maximum is 8 pills of 2mg loperamide a day)   Tips for POUCHING an OSTOMY   Changing Your Pouch The best time to change your pouch is in the morning, before eating or drinking anything. Your stoma can function at any time, but it will function more after eating or drinking.   Applying the pouching system  Place all your equipment close at hand before removing your pouch.  Wash your hands.  Stand or sit in front of a mirror. Use the position that works best for you. Remember that you must keep the skin around the stoma   wrinkle-free for a good seal.  Gently remove the used pouch (1-piece system) or the pouch and old wafer (2-piece system). Empty the pouch into the toilet. Save the closure clip to use again.  Wash the stoma itself and the skin around the stoma. Your stoma may bleed a little when being washed. This is normal. Rinse and pat dry. You may use a wash cloth or soft paper towels (like Bounty), mild soap (like Dial, Safeguard, or Ivory), and water. Avoid soaps that contain perfumes or lotions.  For a new pouch (1-piece system) or a new wafer (2-piece system), measure your  stoma using the stoma guide in each box of supplies.  Trace the shape of your stoma onto the back of the new pouch or the back of the new wafer. Cut out the opening. Remove the paper backing and set it aside.  Optional: Apply a skin barrier powder to surrounding skin if it is irritated (bare or weeping), and dust off the excess. Optional: Apply a skin-prep wipe (such as Skin Prep or All-Kare) to the skin around the stoma, and let it dry. Do not apply this solution if the skin is irritated (red, tender, or broken) or if you have shaved around the stoma. Optional: Apply a skin barrier paste (such as Stomahesive, Coloplast, or Premium) around the opening cut in the back of the pouch or wafer. Allow it to dry for 30 to 60 seconds.  Hold the pouch (1-piece system) or wafer (2-piece system) with the sticky side toward your body. Make sure the skin around the stoma is wrinkle-free. Center the opening on the stoma, then press firmly to your abdomen (Fig. 4). Look in the mirror to check if you are placing the pouch, or wafer, in the right position. For a 2-piece system, snap the pouch onto the wafer. Make sure it snaps into place securely.  Place your hand over the stoma and the pouch or wafer for about 30 seconds. The heat from your hand can help the pouch or wafer stick to your skin.  Add deodorant (such as Super Banish or Nullo) to your pouch. Other options include food extracts such as vanilla oil and peppermint extract. Add about 10 drops of the deodorant to the pouch. Then apply the closure clamp. Note: Do not use toxic  chemicals or commercial cleaning agents in your pouch. These substances may harm the stoma.  Optional: For extra seal, apply tape to all 4 sides around the pouch or wafer, as if you were framing a picture. You may use any brand of medical adhesive tape. Change your pouch every 5 to 7 days. Change it immediately if a leak occurs.  Wash your hands afterwards.  If you are wearing a  2-piece system, you may use 2 new pouches per week and alternate them. Rinse the pouch with mild soap and warm water and hang it to dry for the next day. Apply the fresh pouch. Alternate the 2 pouches like this for a week. After a week, change the wafer and begin with 2 new pouches. Place the old pouches in a plastic bag, and put them in the trash.   LIVING WITH AN OSTOMY  Emptying Your Pouch Empty your pouch when it is one-third full (of urine, stool, and/or gas). If you wait until your pouch is fuller than this, it will be more difficult to empty and more noticeable. When you empty your pouch, either put toilet paper in the toilet bowl first, or flush the   toilet while you empty the pouch. This will reduce splashing. You can empty the pouch between your legs or to one side while sitting, or while standing or stooping. If you have a 2-piece system, you can snap off the pouch to empty it. Remember that your stoma may function during this time. If you wish to rinse your pouch after you empty it, a turkey baster can be helpful. When using a baster, squirt water up into the pouch through the opening at the bottom. With a 2-piece system, you can snap off the pouch to rinse it. After rinsing  your pouch, empty it into the toilet. When rinsing your pouch at home, put a few granules of Dreft soap in the rinse water. This helps lubricate and freshen your pouch. The inside of your pouch can be sprayed with non-stick cooking oil (Pam spray). This may help reduce stool sticking to the inside of the pouch.  Bathing You may shower or bathe with your pouch on or off. Remember that your stoma may function during this time.  The materials you use to wash your stoma and the skin around it should be clean, but they do not need to be sterile.  Wearing Your Pouch During hot weather, or if you perspire a lot in general, wear a cover over your pouch. This may prevent a rash on your skin under the pouch. Pouch covers are  sold at ostomy supply stores. Wear the pouch inside your underwear for better support. Watch your weight. Any gain or loss of 10 to 15 pounds or more can change the way your pouch fits.  Going Away From Home A collapsible cup (like those that come in travel kits) or a soft plastic squirt bottle with a pull-up top (like a travel bottle for shampoo) can be used for rinsing your pouch when you are away from home. Tilt the opening of the pouch at an upward angle when using a cup to rinse.  Carry wet wipes or extra tissues to use in public bathrooms.  Carry an extra pouching system with you at all times.  Never keep ostomy supplies in the glove compartment of your car. Extreme heat or cold can damage the skin barriers and adhesive wafers on the pouch.  When you travel, carry your ostomy supplies with you at all times. Keep them within easy reach. Do not pack ostomy supplies in baggage that will be checked or otherwise separated from you, because your baggage might be lost. If you're traveling out of the country, it is helpful to have a letter stating that you are carrying ostomy supplies as a medical necessity.  If you need ostomy supplies while traveling, look in the yellow pages of the telephone book under "Surgical Supplies." Or call the local ostomy organization to find out where supplies are available.  Do not let your ostomy supplies get low. Always order new pouches before you use the last one.  Reducing Odor Limit foods such as broccoli, cabbage, onions, fish, and garlic in your diet to help reduce odor. Each time you empty your pouch, carefully clean the opening of the pouch, both inside and outside, with toilet paper. Rinse your pouch 1 or 2 times daily after you empty it (see directions for emptying your pouch and going away from home). Add deodorant (such as Super Banish or Nullo) to your pouch. Use air deodorizers in your bathroom. Do not add aspirin to your pouch. Even though  aspirin can help prevent odor, it   could cause ulcers on your stoma.  When to call the doctor Call the doctor if you have any of the following symptoms: Purple, black, or white stoma Severe cramps lasting more than 6 hours Severe watery discharge from the stoma lasting more than 6 hours No output from the colostomy for 3 days Excessive bleeding from your stoma Swelling of your stoma to more than 1/2-inch larger than usual Pulling inward of your stoma below skin level Severe skin irritation or deep ulcers Bulging or other changes in your abdomen  When to call your ostomy nurse Call your ostomy/enterostomal therapy (WOCN) nurse if any of the following occurs: Frequent leaking of your pouching system Change in size or appearance of your stoma, causing discomfort or problems with your pouch Skin rash or rawness Weight gain or loss that causes problems with your pouch     FREQUENTLY ASKED QUESTIONS   Why haven't you met any of these folks who have an ostomy?  Well, maybe you have! You just did not recognize them because an ostomy doesn't show. It can be kept secret if you wish. Why, maybe some of your best friends, office associates or neighbors have an ostomy ... you never can tell. People facing ostomy surgery have many quality-of-life questions like: Will you bulge? Smell? Make noises? Will you feel waste leaving your body? Will you be a captive of the toilet? Will you starve? Be a social outcast? Get/stay married? Have babies? Easily bathe, go swimming, bend over?  OK, let's look at what you can expect:   Will you bulge?  Remember, without part of the intestine or bladder, and its contents, you should have a flatter tummy than before. You can expect to wear, with little exception, what you wore before surgery ... and this in-cludes tight clothing and bathing suits.   Will you smell?  Today, thanks to modern odor proof pouching systems, you can walk into an ostomy support group  meeting and not smell anything that is foul or offensive. And, for those with an ileostomy or colostomy who are concerned about odor when emptying their pouch, there are in-pouch deodorants that can be used to eliminate any waste odors that may exist.   Will you make noises?  Everyone produces gas, especially if they are an air-swallower. But intestinal sounds that occur from time to time are no differ-ent than a gurgling tummy, and quite often your clothing will muffle any sounds.   Will you feel the waste discharges?  For those with a colostomy or ileostomy there might be a slight pressure when waste leaves your body, but understand that the intestines have no nerve endings, so there will be no unpleasant sensations. Those with a urostomy will probably be unaware of any kidney drainage.   Will you be a captive of the toilet?  Immediately post-op you will spend more time in the bathroom than you will after your body recovers from surgery. Every person is different, but on average those with an ileostomy or urostomy may empty their pouches 4 to 6 times a day; a little  less if you have a colostomy. The average wear time between pouch system changes is 3 to 5 days and the changing process should take less than 30 minutes.   Will I need to be on a special diet? Most people return to their normal diet when they have recovered from surgery. Be sure to chew your food well, eat a well-balanced diet and drink plenty of fluids. If   you experience problems with a certain food, wait a couple of weeks and try it again.  Will there be odor and noises? Pouching systems are designed to be odor-proof or odor-resistant. There are deodorants that can be used in the pouch. Medications are also available to help reduce odor. Limit gas-producing foods and carbonated beverages. You will experience less gas and fewer noises as you heal from surgery.  How much time will it take to care for my ostomy? At first, you may  spend a lot of time learning about your ostomy and how to take care of it. As you become more comfortable and skilled at changing the pouching system, it will take very little time to care for it.   Will I be able to return to work? People with ostomies can perform most jobs. As soon as you have healed from surgery, you should be able to return to work. Heavy lifting (more than 10 pounds) may be discouraged.   What about intimacy? Sexual relationships and intimacy are important and fulfilling aspects of your life. They should continue after ostomy surgery. Intimacy-related concerns should be discussed openly between you and your partner.   Can I wear regular clothing? You do not need to wear special clothing. Ostomy pouches are fairly flat and barely noticeable. Elastic undergarments will not hurt the stoma or prevent the ostomy from functioning.   Can I participate in sports? An ostomy should not limit your involvement in sports. Many people with ostomies are runners, skiers, swimmers or participate in other active lifestyles. Talk with your caregiver first before doing heavy physical activity.  Will you starve?  Not if you follow doctor's orders at each stage of your post-op adjustment. There is no such thing as an "ostomy diet". Some people with an ostomy will be able to eat and tolerate anything; others may find diffi-culty with some foods. Each person is an individual and must determine, by trial, what is best for them. A good practice for all is to drink plenty of water.   Will you be a social outcast?  Have you met anyone who has an ostomy and is a social outcast? Why should you be the first? Only your attitude and self image will effect how you are treated. No confi-dent person is an outcast.    PROFESSIONAL HELP   Resources are available if you need help or have questions about your ostomy.   Specially trained nurses called Wound, Ostomy Continence Nurses (WOCN) are available for  consultation in most major medical centers.  Consider getting an ostomy consult at an outpatient ostomy clinic.   [facility] has an Ostomy Clinic run by an WOCN ostomy nurse at the [facility].  [phone]. [facility] can help set up an appointment   The [employer] Ost[employer] Association (UOA) is a group made up of many local chapters throughout the [place]. These local groups hold meetings and provide support to prospective and existing ostomates. They sponsor educational events and have qualified visitors to make personal or telephone visits. Contact the UOA for the chapter nearest you and for other educational publications.  More detailed information can be found in Colostomy Guide, a publication of the [employer] Ostomy Association (UOA). Contact UOA at 1-800-826-0826 or visit their web site at www.uoaa.org. The website contains links to other sites, suppliers and resources.  [facility] Secure Start Services: Start at the website to enlist for support.  Your Wound Ostomy (WOCN) nurse may have started this   process. [email or web address]://[email or web address].com/en/securestart Secure Start services are designed to support people as they live their lives with an ostomy or neurogenic bladder. Enrolling is easy and at no cost to the patient. We realize that each person's needs and life journey are different. Through Secure Start services, we want to help people live their life, their way.  #######################################################  

## 2023-03-13 NOTE — Op Note (Signed)
Procedure: Cystoscopy with bilateral ureteral catheterization and injection of firefly solution.  Preop diagnosis: Colovesical fistula.  Postop diagnosis same.  Surgeon: Dr. Bjorn Pippin.  Anesthesia: General.  Specimen: None.  Drains: 14 French Foley catheter.  EBL: None.  Complications: None.  Indications: The patient is a 52 year old female with a colovesical fistula who is to undergo robotic colon resection.  It was felt that firefly would assist the procedure.  Procedure: She was taken operating room where she was given antibiotics per general surgery orders.  A general anesthetic was induced.  She was placed in lithotomy position and fitted with PAS hose.  Her perineum and genitalia were prepped with Betadine solution and she was draped in usual sterile fashion.  Cystoscopy was performed using 21 [place] scope and 30 degree lens.  Examination demonstrated normal urethra.  The bladder wall had mild inflammatory changes but there was a colovesical fistula on the left anterior bladder wall that was feculent material.  The ureteral orifices were unremarkable.  The right ureteral orifice was cannulated with a 5 [place] open-ended catheter and 7.5 mL of firefly solution was gently instilled without difficulty.  The left ureteral orifice required a sensor wire through the catheter to get the open-ended catheter in place but once it was in position and the wire was removed 7.5 mL of firefly solution was gently instilled without difficulty.  The cystoscope was removed and a 14 French Foley catheter was inserted.  The balloon was filled with 10 mL of sterile fluid.  And the cath was placed to straight drainage.  There were no complications during my portion of the procedure

## 2023-03-13 NOTE — Transfer of Care (Signed)
Immediate Anesthesia Transfer of Care Note  Patient: Cheryl Scott  Procedure(s) Performed: XI ROBOT ASSISTED LAPAROSCOPIC SIGMOIDECTOMY CYSTOSCOPY with FIREFLY INJECTION  Patient Location: PACU  Anesthesia Type:General  Level of Consciousness: sedated  Airway & Oxygen Therapy: Patient Spontanous Breathing and Patient connected to face mask oxygen  Post-op Assessment: Report given to RN and Post -op Vital signs reviewed and stable  Post vital signs: Reviewed and stable  Last Vitals:  Vitals Value Taken Time  BP 163/85 03/13/23 1635  Temp    Pulse 63 03/13/23 1637  Resp 21 03/13/23 1638  SpO2 100 % 03/13/23 1637  Vitals shown include unfiled device data.  Last Pain:  Vitals:   03/13/23 1225  TempSrc: Oral  PainSc:          Complications: No notable events documented.

## 2023-03-13 NOTE — Op Note (Signed)
03/13/2023  4:16 PM  PATIENT:  Cheryl Scott  52 y.o. female  Patient Care Team: Park Meo, FNP as PCP - General (Family Medicine) Lanelle Bal, DO as Consulting Physician (Internal Medicine) Ginette Otto, [facility] as Consulting Physician  PRE-OPERATIVE DIAGNOSIS:  COLOVESICAL FISTULA  POST-OPERATIVE DIAGNOSIS:  COLOVESICAL FISTULA  PROCEDURE:  XI ROBOT ASSISTED SIGMOIDECTOMY, SPLENIC FLEXURE MOBILIZATION CYSTOSCOPY with FIREFLY INJECTION   Surgeon(s): Bjorn Pippin, MD Romie Levee, MD Andria Meuse, MD  ASSISTANT: Dr Cliffton Asters   ANESTHESIA:   local and general  EBL:60ml  Total I/O In: -  Out: 33 [Blood:75]  Delay start of Pharmacological VTE agent (>24hrs) due to surgical blood loss or risk of bleeding:  no  DRAINS: none   SPECIMEN:  Source of Specimen:  Descending and Sigmoid colon  DISPOSITION OF SPECIMEN:  PATHOLOGY  COUNTS:  YES  PLAN OF CARE: Admit to inpatient   PATIENT DISPOSITION:  PACU - hemodynamically stable.  INDICATION:    52 y.o. F with colovesical fistula.  I recommended segmental resection:  The anatomy & physiology of the digestive tract was discussed.  The pathophysiology was discussed.  Natural history risks without surgery was discussed.   I worked to give an overview of the disease and the frequent need to have multispecialty involvement.  I feel the risks of no intervention will lead to serious problems that outweigh the operative risks; therefore, I recommended a partial colectomy to remove the pathology.  Laparoscopic & open techniques were discussed.   Risks such as bleeding, infection, abscess, leak, reoperation, possible ostomy, hernia, heart attack, death, and other risks were discussed.  I noted a good likelihood this will help address the problem.   Goals of post-operative recovery were discussed as well.    The patient expressed understanding & wished to proceed with surgery.  OR FINDINGS:   Patient  had large segment of chronically inflamed descending colon  No obvious metastatic disease on visceral parietal peritoneum or liver.  The anastomosis rests ~20 cm from the anal verge by rigid proctoscopy.  DESCRIPTION:   Informed consent was confirmed.  The patient underwent general anaesthesia without difficulty.  The patient was positioned appropriately.  VTE prevention in place.  The patient's abdomen was clipped, prepped, & draped in a sterile fashion.  Surgical timeout confirmed our plan.  The patient was positioned in reverse Trendelenburg.  Abdominal entry was gained using a Varies needle in the LUQ.  Entry was clean.  I induced carbon dioxide insufflation.  An 8mm robotic port was placed in the RUQ.  Camera inspection revealed no injury.  Extra ports were carefully placed under direct laparoscopic visualization.  I laparoscopically reflected the greater omentum and the upper abdomen the small bowel in the upper abdomen. The patient was appropriately positioned and the robot was docked to the patient's left side.  Instruments were placed under direct visualization.    I mobilized the sigmoid colon off of the pelvic sidewall.  I scored the base of peritoneum of the right side of the mesentery of the left colon from the ligament of Treitz to the peritoneal reflection of the mid rectum.  The patient had significant inflammation of her distal descending colon and sigmoid colon.  I divided the colon at the level of the colovesical fistula.  This was done using the robotic vessel sealer and blunt dissection.  Once the entire colon was mobilized away from the abdominal wall, there did not appear to be an active  communication into the bladder.  The bladder was irrigated with approximately 300 mL of normal saline.  There was no leak noted at the fistula site.  I elevated the sigmoid mesentery and enetered into the retro-mesenteric plane. We were able to identify the left ureter and gonadal vessels. We kept  those posterior within the retroperitoneum and elevated the left colon mesentery off that. I did isolated IMA pedicle but did not ligate it yet.  I continued distally and got into the avascular plane posterior to the mesorectum. This allowed me to help mobilize the rectum as well by freeing the mesorectum off the sacrum.  I mobilized the peritoneal coverings towards the peritoneal reflection on both the right and left sides of the rectum.  I could see the right and left ureters and stayed away from them.    I skeletonized the inferior mesenteric artery pedicle.  I went down to its takeoff from the aorta.   I isolated the inferior mesenteric vein off of the ligament of Treitz just cephalad to that as well.  After confirming the left ureter was out of the way, I went ahead and ligated the inferior mesenteric artery pedicle with bipolar robotic vessel sealer ~2cm above its takeoff from the aorta.  We ensured hemostasis. I skeletonized the mesorectum at the junction at the proximal rectum using blunt dissection & bipolar robotic vessel sealer.  I mobilized the left colon in a lateral to medial fashion off the line of Toldt up towards the splenic flexure to ensure good mobilization of the left colon to reach into the pelvis.  I identified the inflammation in the colon.  There was a obvious transition point from normal colon to inflamed colon.  This was approximately the mid descending colon.  This would not reach into the pelvis and therefore I mobilized the splenic flexure.  This was done using the robotic vessel sealer taking down the splenocolic ligaments and dissecting the colon completely off of the left abdominal wall.  This allowed for mobilization of the colon into the pelvis.  We then injected firefly intravenously to confirm good perfusion of the remaining colon and rectum.  This was confirmed.  At this point we divided the rectosigmoid junction using a robotic 60 mm green load stapler.  The abdomen was  then inspected for hemostasis.  There was no active bleeding noted.  We then removed the instruments and undocked the robot.  The suprapubic port was converted to a Pfannenstiel incision.  An Alexis wound protector was placed and the colon was brought out through this and transected over a pursestring device at the previously divided proximal mesentery.  A 2-0 Prolene pursestring was placed.  This was secured with 3-0 silk sutures.  A 29 mm EEA anvil was placed.  Pursestring was tied tightly around this and this was placed back into the abdomen.  The EEA stapler was inserted into the rectum and brought out through the distal rectal stump.  An anastomosis was created between the remaining colon and the rectum.  There was no tension noted on the anastomosis.  There was no leak when tested with insufflation under irrigation.  The remaining abdomen was also irrigated.  There was no sign of injury or active bleeding.  We then switched to clean gowns, gloves, instruments and drapes.  The Pfannenstiel peritoneum was closed using a 0 Vicryl suture.  The fascia was closed using 2, running #1 Novafil sutures.  Subcutaneous tissue was reapproximated using a running 2-0 Vicryl suture  and the skin was closed using a running 4-0 Vicryl suture.  A sterile dressing was applied.  The remaining port sites were closed using interrupted 4-0 Vicryl sutures and Dermabond.   An MD assistant was necessary for tissue manipulation, retraction and positioning due to the complexity of the case and hospital policies   Vanita Panda, MD  Colorectal and General Surgery [facility]

## 2023-03-13 NOTE — H&P (Signed)
REFERRING PHYSICIAN:  Kurtis Bushman, NP   PROVIDER:  Elenora Gamma, MD   MRN: [id number] DOB: [birth year]   Subjective   History of Present Illness:   Patient presents to the office with stool leakage during urination mostly.  It sounds like she thought that this was coming vaginally and was evaluated by her gynecologist.  No obvious fistula was noted.  She is here today for anal evaluation.  Patient has a history of diverticulosis without known diverticulitis.  She has a history of an episiotomy in the past.  She denies any anorectal surgeries.  She has not had a hysterectomy. Colonoscopy performed March 2024 showed diverticulosis 3 polyps in the ascending colon, 1 polyp in the transverse colon and 1 polyp in the sigmoid colon.  These were all resected and removed.  Sigmoid colon polyp was tattooed.   I saw her in the office and found nothing distally concerning for a fistula.  I recommended a pelvic CT scan to evaluate for a more proximal fistula.  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  There was quite a bit of thickening in the colon concerning for chronic inflammation and diverticulitis.   Review of Systems: A complete review of systems was obtained from the patient.  I have reviewed this information and discussed as appropriate with the patient.  See HPI as well for other ROS.     Medical History: Past Medical History: DiagnosisDate            Anemia                        Anxiety                        Arthritis                        Chronic kidney disease                      GERD (gastroesophageal reflux disease)                  Hyperlipidemia                        Hypertension               Sleep apnea         Patient Active Problem List Diagnosis Type 2 diabetes mellitus without complications (CMS/HHS-HCC) Obstructive sleep apnea (adult) (pediatric) Chronic constipation     History reviewed. No  pertinent surgical history.    Allergies AllergenReactions PenicillinsAnaphylaxis, Hives and Itching QuetiapineAnaphylaxis                         Hypotension, alt mental status CodeineHives and Itching LatexRash     Current Outpatient Medications on File Prior to Visit MedicationSigDispenseRefill            acetaminophen (TYLENOL) 500 MG tablet   Take 1,000 mg by mouth                                atorvastatin (LIPITOR) 20 MG tablet  Take 1 tablet by mouth once daily  BERBERINE CHLORIDE ORAL        Take by mouth                                    carvediloL (COREG) 6.25 MG tablet  Take 2 tablets by mouth 2 (two) times daily                           cetirizine (ZYRTEC) 10 MG tablet      Take 1 tablet by mouth once daily                              chlorthalidone 25 MG tablet    TAKE 0.5 TABLETS BY MOUTH 1 TIME EACH DAY.                           EPINEPHrine (EPIPEN) 0.3 mg/0.3 mL auto-injector            Inject into the muscle                          ferrous sulfate 325 (65 FE) MG EC tablet      Take 325 mg by mouth                                   hydroCHLOROthiazide (HYDRODIURIL) 25 MG tablet        Take 1 tablet by mouth once daily                                lamoTRIgine (LAMICTAL) 200 MG tablet      Take 1 tablet by mouth 2 (two) times daily                            linaCLOtide (LINZESS) 145 mcg capsule      TAKE 1 CAPSULE BY MOUTH EVERY DAY BEFORE BREAKFAST                                   pantoprazole (PROTONIX) 40 MG DR tablet            TAKE 1 TABLET BY MOUTH TWICE A DAY BEFORE A MEAL                             sertraline (ZOLOFT) 100 MG tablet   Take 1 tablet by mouth 2 (two) times daily                    No current facility-administered medications on file prior to visit.     Family History ProblemRelationAge of Onset            High blood pressure (Hypertension)   Mother             Obesity            Mother             Diabetes           Mother  Hyperlipidemia (Elevated cholesterol)            Father              Coronary Artery Disease (Blocked arteries around heart)     Father              High blood pressure (Hypertension)   Father              Colon cancer   Sister               Obesity            Sister               High blood pressure (Hypertension)   Sister               Diabetes          Sister               Obesity            Brother                        High blood pressure (Hypertension)   Brother                 Social History   Tobacco Use Smoking StatusFormer Types:Cigarettes Smokeless TobaccoNever     Social History   Socioeconomic History Marital status:Married Tobacco Use Smoking status:Former                         Types: Cigarettes Smokeless tobacco:Never Vaping Use Vaping status:Never Used Substance and Sexual Activity Alcohol WNU:UVOZD Drug GUY:QIHKV     Objective:   Vitals:   03/13/23 1225  BP: (!) 161/103  Pulse: 71  Resp: 18  Temp: 97.9 F (36.6 C)  SpO2: 99%        Exam Gen: NAD Abd: soft CV: RRR Lungs: CTA      Labs, Imaging and Diagnostic Testing:   CT scan reviewed.  Patient with what appears to be a colovesicular fistula to the dome of the bladder on the left side.  Significant wall thickening noted throughout the sigmoid colon   Assessment and Plan: Diagnoses and all orders for this visit:   Colovesical fistula. 52 year old female with colon thickening and concern for Colo-vesicular fistula.  She has had a recent colonoscopy which was normal and negative for malignancy.  I recommend proceeding with a robotic assisted partial colectomy.  We discussed the possible need for bladder repair.  We discussed the need for firefly injection for ureter identification.  All questions were answered.   The surgery and anatomy were described to the patient as well as the risks of surgery and the possible complications.  These include: Bleeding, deep  abdominal infections and possible wound complications such as hernia and infection, damage to adjacent structures, leak of surgical connections, which can lead to other surgeries and possibly an ostomy, possible need for other procedures, such as abscess drains in radiology, possible prolonged hospital stay, possible diarrhea from removal of part of the colon, possible constipation from narcotics, possible bowel, bladder or sexual dysfunction if having rectal surgery, prolonged fatigue/weakness or appetite loss, possible early recurrence of of disease, possible complications of their medical problems such as heart disease or arrhythmias or lung problems, death (less than 1%). I believe the patient understands and wishes to proceed with the surgery.  Vanita Panda, MD Colon and Rectal Surgery [facility]

## 2023-03-13 NOTE — Anesthesia Procedure Notes (Signed)
Procedure Name: Intubation Date/Time: 03/13/2023 1:54 PM  Performed by: Lezlie Lye, CRNAPre-anesthesia Checklist: Patient identified, Emergency Drugs available, Suction available and Patient being monitored Patient Re-evaluated:Patient Re-evaluated prior to induction Oxygen Delivery Method: Circle System Utilized Preoxygenation: Pre-oxygenation with 100% oxygen Induction Type: IV induction Ventilation: Mask ventilation without difficulty Laryngoscope Size: Miller and 3 Grade View: Grade I Tube type: Oral Tube size: 7.5 mm Number of attempts: 1 Airway Equipment and Method: Stylet and Oral airway Placement Confirmation: ETT inserted through vocal cords under direct vision, positive ETCO2 and breath sounds checked- equal and bilateral Secured at: 21 cm Tube secured with: Tape Dental Injury: Teeth and Oropharynx as per pre-operative assessment

## 2023-03-14 LAB — CBC
HCT: 35.9 % — ABNORMAL LOW (ref 36.0–46.0)
Hemoglobin: 11.4 g/dL — ABNORMAL LOW (ref 12.0–15.0)
MCH: 27.1 pg (ref 26.0–34.0)
MCHC: 31.8 g/dL (ref 30.0–36.0)
MCV: 85.3 fL (ref 80.0–100.0)
Platelets: 311 10*3/uL (ref 150–400)
RBC: 4.21 MIL/uL (ref 3.87–5.11)
RDW: 14.6 % (ref 11.5–15.5)
WBC: 12.9 10*3/uL — ABNORMAL HIGH (ref 4.0–10.5)
nRBC: 0 % (ref 0.0–0.2)

## 2023-03-14 LAB — BASIC METABOLIC PANEL
Anion gap: 11 (ref 5–15)
BUN: 21 mg/dL — ABNORMAL HIGH (ref 6–20)
CO2: 26 mmol/L (ref 22–32)
Calcium: 8.7 mg/dL — ABNORMAL LOW (ref 8.9–10.3)
Chloride: 101 mmol/L (ref 98–111)
Creatinine, Ser: 2.08 mg/dL — ABNORMAL HIGH (ref 0.44–1.00)
GFR, Estimated: 28 mL/min — ABNORMAL LOW (ref >60)
Glucose, Bld: 143 mg/dL — ABNORMAL HIGH (ref 70–99)
Potassium: 3.4 mmol/L — ABNORMAL LOW (ref 3.5–5.1)
Sodium: 138 mmol/L (ref 135–145)

## 2023-03-14 MED ORDER — CHLORHEXIDINE GLUCONATE CLOTH 2 % EX PADS
6.0000 | MEDICATED_PAD | Freq: Every day | CUTANEOUS | Status: DC
  Administered 2023-03-14 – 2023-03-16 (×2): 6 via TOPICAL

## 2023-03-14 NOTE — Progress Notes (Signed)
1 Day Post-Op   Subjective/Chief Complaint: Complains of soreness   Objective: Vital signs in last 24 hours: Temp:  [97.7 F (36.5 C)-98.1 F (36.7 C)] 98.1 F (36.7 C) (08/17 0535) Pulse Rate:  [55-71] 64 (08/17 0733) Resp:  [11-20] 18 (08/17 0535) BP: (132-173)/(81-103) 132/88 (08/17 0535) SpO2:  [99 %-100 %] 100 % (08/17 0535) Weight:  [92.1 kg-94.2 kg] 94.2 kg (08/17 0535) Last BM Date : 03/13/23  Intake/Output from previous day: 08/16 0701 - 08/17 0700 In: 1832.5 [P.O.:120; I.V.:1712.5] Out: 2075 [Urine:2000; Blood:75] Intake/Output this shift: No intake/output data recorded.  General appearance: alert and cooperative Resp: clear to auscultation bilaterally Cardio: regular rate and rhythm GI: soft, mild tenderness. Quiet. Incisions ok  Lab Results:  Recent Labs    03/14/23 0506  WBC 12.9*  HGB 11.4*  HCT 35.9*  PLT 311   BMET Recent Labs    03/14/23 0506  NA 138  K 3.4*  CL 101  CO2 26  GLUCOSE 143*  BUN 21*  CREATININE 2.08*  CALCIUM 8.7*   PT/INR No results for input(s): "LABPROT", "INR" in the last 72 hours. ABG No results for input(s): "PHART", "HCO3" in the last 72 hours.  Invalid input(s): "PCO2", "PO2"  Studies/Results: No results found.  Anti-infectives: Anti-infectives (From admission, onward)    Start     Dose/Rate Route Frequency Ordered Stop   03/14/23 0600  ertapenem (INVANZ) 1 g in sodium chloride 0.9 % 100 mL IVPB  Status:  Discontinued        1 g 200 mL/hr over 30 Minutes Intravenous On call to O.R. 03/13/23 1206 03/13/23 1229   03/13/23 1230  ertapenem (INVANZ) 1 g in sodium chloride 0.9 % 100 mL IVPB  Status:  Discontinued        1 g 200 mL/hr over 30 Minutes Intravenous On call to O.R. 03/13/23 1229 03/13/23 1740       Assessment/Plan: s/p Procedure(s): XI ROBOT ASSISTED LAPAROSCOPIC SIGMOIDECTOMY (N/A) CYSTOSCOPY with FIREFLY INJECTION (N/A) Stay on clears until bowel function returns Ambulate POD 1  LOS: 1  day    Chevis Pretty III 03/14/2023

## 2023-03-15 LAB — BASIC METABOLIC PANEL
Anion gap: 10 (ref 5–15)
BUN: 19 mg/dL (ref 6–20)
CO2: 24 mmol/L (ref 22–32)
Calcium: 8.3 mg/dL — ABNORMAL LOW (ref 8.9–10.3)
Chloride: 102 mmol/L (ref 98–111)
Creatinine, Ser: 2.32 mg/dL — ABNORMAL HIGH (ref 0.44–1.00)
GFR, Estimated: 24 mL/min — ABNORMAL LOW (ref >60)
Glucose, Bld: 108 mg/dL — ABNORMAL HIGH (ref 70–99)
Potassium: 3.5 mmol/L (ref 3.5–5.1)
Sodium: 136 mmol/L (ref 135–145)

## 2023-03-15 LAB — CBC
HCT: 32.4 % — ABNORMAL LOW (ref 36.0–46.0)
Hemoglobin: 9.4 g/dL — ABNORMAL LOW (ref 12.0–15.0)
MCH: 25.9 pg — ABNORMAL LOW (ref 26.0–34.0)
MCHC: 29 g/dL — ABNORMAL LOW (ref 30.0–36.0)
MCV: 89.3 fL (ref 80.0–100.0)
Platelets: 252 10*3/uL (ref 150–400)
RBC: 3.63 MIL/uL — ABNORMAL LOW (ref 3.87–5.11)
RDW: 14.8 % (ref 11.5–15.5)
WBC: 7.6 10*3/uL (ref 4.0–10.5)
nRBC: 0 % (ref 0.0–0.2)

## 2023-03-15 MED ORDER — SODIUM CHLORIDE 0.9 % IV BOLUS
500.0000 mL | Freq: Once | INTRAVENOUS | Status: AC
  Administered 2023-03-15: 500 mL via INTRAVENOUS

## 2023-03-15 NOTE — Plan of Care (Signed)
  Problem: Education: Goal: Understanding of discharge needs will improve Outcome: Progressing Goal: Verbalization of understanding of the causes of altered bowel function will improve Outcome: Progressing   Problem: Activity: Goal: Ability to tolerate increased activity will improve Outcome: Progressing    Problem: Safety: Goal: Ability to remain free from injury will improve Outcome: Progressing

## 2023-03-15 NOTE — Anesthesia Postprocedure Evaluation (Signed)
Anesthesia Post Note  Patient: Cheryl Scott  Procedure(s) Performed: XI ROBOT ASSISTED LAPAROSCOPIC SIGMOIDECTOMY CYSTOSCOPY with FIREFLY INJECTION     Patient location during evaluation: PACU Anesthesia Type: General Level of consciousness: awake and alert Pain management: pain level controlled Vital Signs Assessment: post-procedure vital signs reviewed and stable Respiratory status: spontaneous breathing, nonlabored ventilation, respiratory function stable and patient connected to nasal cannula oxygen Cardiovascular status: blood pressure returned to baseline and stable Postop Assessment: no apparent nausea or vomiting Anesthetic complications: no  No notable events documented.  Last Vitals:  Vitals:   03/15/23 0539 03/15/23 0815  BP: (!) 91/58 115/76  Pulse: (!) 51 60  Resp: 18   Temp: 36.6 C   SpO2: 95%     Last Pain:  Vitals:   03/15/23 0819  TempSrc:   PainSc: 5                  Jalaiya Oyster L Junie Avilla

## 2023-03-15 NOTE — Progress Notes (Signed)
2 Days Post-Op   Subjective/Chief Complaint: Complains of loose bm's   Objective: Vital signs in last 24 hours: Temp:  [97.5 F (36.4 C)-97.9 F (36.6 C)] 97.9 F (36.6 C) (08/18 0539) Pulse Rate:  [51-60] 60 (08/18 0815) Resp:  [16-20] 18 (08/18 0539) BP: (91-142)/(58-96) 115/76 (08/18 0815) SpO2:  [95 %-100 %] 95 % (08/18 0539) Last BM Date : 03/16/23  Intake/Output from previous day: 08/17 0701 - 08/18 0700 In: 1355.5 [P.O.:480; I.V.:875.5] Out: 2700 [Urine:2700] Intake/Output this shift: No intake/output data recorded.  General appearance: alert and cooperative Resp: clear to auscultation bilaterally Cardio: regular rate and rhythm GI: soft, mild tenderness bilaterally. Good bs  Lab Results:  Recent Labs    03/14/23 0506 03/15/23 0606  WBC 12.9* 7.6  HGB 11.4* 9.4*  HCT 35.9* 32.4*  PLT 311 252   BMET Recent Labs    03/14/23 0506 03/15/23 0606  NA 138 136  K 3.4* 3.5  CL 101 102  CO2 26 24  GLUCOSE 143* 108*  BUN 21* 19  CREATININE 2.08* 2.32*  CALCIUM 8.7* 8.3*   PT/INR No results for input(s): "LABPROT", "INR" in the last 72 hours. ABG No results for input(s): "PHART", "HCO3" in the last 72 hours.  Invalid input(s): "PCO2", "PO2"  Studies/Results: No results found.  Anti-infectives: Anti-infectives (From admission, onward)    Start     Dose/Rate Route Frequency Ordered Stop   03/14/23 0600  ertapenem (INVANZ) 1 g in sodium chloride 0.9 % 100 mL IVPB  Status:  Discontinued        1 g 200 mL/hr over 30 Minutes Intravenous On call to O.R. 03/13/23 1206 03/13/23 1229   03/13/23 1230  ertapenem (INVANZ) 1 g in sodium chloride 0.9 % 100 mL IVPB  Status:  Discontinued        1 g 200 mL/hr over 30 Minutes Intravenous On call to O.R. 03/13/23 1229 03/13/23 1740       Assessment/Plan: s/p Procedure(s): XI ROBOT ASSISTED LAPAROSCOPIC SIGMOIDECTOMY (N/A) CYSTOSCOPY with FIREFLY INJECTION (N/A) Advance diet. Start soft foods  today Ambulate POD 2 Foley out overnight  LOS: 2 days    Chevis Pretty III 03/15/2023

## 2023-03-16 LAB — BASIC METABOLIC PANEL
Anion gap: 7 (ref 5–15)
BUN: 27 mg/dL — ABNORMAL HIGH (ref 6–20)
CO2: 26 mmol/L (ref 22–32)
Calcium: 8.3 mg/dL — ABNORMAL LOW (ref 8.9–10.3)
Chloride: 105 mmol/L (ref 98–111)
Creatinine, Ser: 2.4 mg/dL — ABNORMAL HIGH (ref 0.44–1.00)
GFR, Estimated: 23 mL/min — ABNORMAL LOW (ref >60)
Glucose, Bld: 104 mg/dL — ABNORMAL HIGH (ref 70–99)
Potassium: 4 mmol/L (ref 3.5–5.1)
Sodium: 138 mmol/L (ref 135–145)

## 2023-03-16 LAB — CBC
HCT: 29.5 % — ABNORMAL LOW (ref 36.0–46.0)
Hemoglobin: 9 g/dL — ABNORMAL LOW (ref 12.0–15.0)
MCH: 26.9 pg (ref 26.0–34.0)
MCHC: 30.5 g/dL (ref 30.0–36.0)
MCV: 88.1 fL (ref 80.0–100.0)
Platelets: 223 10*3/uL (ref 150–400)
RBC: 3.35 MIL/uL — ABNORMAL LOW (ref 3.87–5.11)
RDW: 14.6 % (ref 11.5–15.5)
WBC: 7.7 10*3/uL (ref 4.0–10.5)
nRBC: 0 % (ref 0.0–0.2)

## 2023-03-16 MED ORDER — OXYCODONE HCL 5 MG PO TABS: 5.0000 mg | ORAL_TABLET | Freq: Four times a day (QID) | ORAL | 0 refills | Status: DC | PRN

## 2023-03-16 MED ORDER — OXYCODONE HCL 5 MG PO TABS
5.0000 mg | ORAL_TABLET | Freq: Four times a day (QID) | ORAL | Status: DC | PRN
  Administered 2023-03-16: 5 mg via ORAL
  Filled 2023-03-16: qty 1

## 2023-03-16 NOTE — Progress Notes (Signed)
Patient discharged home with walker and abdominal binder upon request, IV removed, discharge paperwork explained and patient verbalized understanding.

## 2023-03-16 NOTE — Evaluation (Signed)
Physical Therapy Evaluation Patient Details Name: Cheryl Scott MRN: [id number] DOB: [birth year] Today's Date: 03/16/2023  History of Present Illness  52 yo female s/p ROBOT ASSISTED SIGMOIDECTOMY, SPLENIC FLEXURE MOBILIZATION  CYSTOSCOPY with FIREFLY INJECTION. PMH: CKD, HTN  Clinical Impression  Patient evaluated by Physical Therapy with no further acute PT needs identified. All education has been completed and the patient has no further questions.  Pt  initially sleepy however was easily aroused and able to amb hallway distance with RW, BP soft but improved after mobility. Pt feels more steady with RW and with less abd pain; requests abd binder RN notified  See below for any follow-up Physical Therapy or equipment needs. PT is signing off. Thank you for this referral.         If plan is discharge home, recommend the following: Assist for transportation   Can travel by private vehicle        Equipment Recommendations Rolling walker (2 wheels)  Recommendations for Other Services       Functional Status Assessment Patient has had a recent decline in their functional status and demonstrates the ability to make significant improvements in function in a reasonable and predictable amount of time.     Precautions / Restrictions Precautions Precautions: Fall Precaution Comments: abdominal Restrictions Weight Bearing Restrictions: No      Mobility  Bed Mobility Overal bed mobility: Needs Assistance Bed Mobility: Rolling, Sidelying to Sit Rolling: Supervision, Contact guard assist Sidelying to sit: Contact guard assist       General bed mobility comments: cues for log roll technique    Transfers Overall transfer level: Needs assistance Equipment used: Rolling walker (2 wheels) Transfers: Sit to/from Stand Sit to Stand: Contact guard assist           General transfer comment: cues for hand placement    Ambulation/Gait Ambulation/Gait assistance: Contact guard  assist Gait Distance (Feet): 60 Feet Assistive device: Rolling walker (2 wheels) Gait Pattern/deviations: Step-through pattern       General Gait Details: cues for RW position; BP soft 90/59 prior to amb, 107/79 after amb; pt reported mild dizziness which resolved with seated rest; encouaged oral intake/fluids  Stairs            Wheelchair Mobility     Tilt Bed    Modified Rankin (Stroke Patients Only)       Balance Overall balance assessment: No apparent balance deficits (not formally assessed)                                           Pertinent Vitals/Pain Pain Assessment Pain Assessment: Faces Faces Pain Scale: Hurts a little bit Pain Location: abd Pain Descriptors / Indicators: Sore Pain Intervention(s): Limited activity within patient's tolerance, Monitored during session, Repositioned    Home Living Family/patient expects to be discharged to:: Private residence Living Arrangements: Children     Home Access: Stairs to enter   Secretary/administrator of Steps: 4   Home Layout: One level        Prior Function Prior Level of Function : Independent/Modified Independent                     Extremity/Trunk Assessment   Upper Extremity Assessment Upper Extremity Assessment: Overall WFL for tasks assessed    Lower Extremity Assessment Lower Extremity Assessment: Overall WFL for tasks assessed  Communication   Communication Communication: No apparent difficulties  Cognition Arousal: Alert Behavior During Therapy: WFL for tasks assessed/performed Overall Cognitive Status: Within Functional Limits for tasks assessed                                 General Comments: initially sleepy        General Comments      Exercises General Exercises - Lower Extremity Ankle Circles/Pumps: AROM, Both, 10 reps   Assessment/Plan    PT Assessment Patient does not need any further PT services  PT Problem List          PT Treatment Interventions      PT Goals (Current goals can be found in the Care Plan section)  Acute Rehab PT Goals PT Goal Formulation: All assessment and education complete, DC therapy    Frequency       Co-evaluation               AM-PAC PT "6 Clicks" Mobility  Outcome Measure Help needed turning from your back to your side while in a flat bed without using bedrails?: A Little Help needed moving from lying on your back to sitting on the side of a flat bed without using bedrails?: A Little Help needed moving to and from a bed to a chair (including a wheelchair)?: A Little Help needed standing up from a chair using your arms (e.g., wheelchair or bedside chair)?: A Little Help needed to walk in hospital room?: A Little Help needed climbing 3-5 steps with a railing? : A Little 6 Click Score: 18    End of Session Equipment Utilized During Treatment: Gait belt Activity Tolerance: Patient tolerated treatment well Patient left: with call bell/phone within reach;in chair Nurse Communication: Mobility status PT Visit Diagnosis: Other abnormalities of gait and mobility (R26.89);Difficulty in walking, not elsewhere classified (R26.2)    Time: 2956-2130 PT Time Calculation (min) (ACUTE ONLY): 19 min   Charges:   PT Evaluation $PT Eval Low Complexity: 1 Low   PT General Charges $$ ACUTE PT VISIT: 1 Visit         Lonzy Mato, PT  Acute Rehab Dept ([facility]) [phone]  03/16/2023   [facility] 03/16/2023, 2:08 PM

## 2023-03-16 NOTE — TOC Transition Note (Signed)
Transition of Care [facility]) - CM/SW Discharge Note  Patient Details  Name: Cheryl Scott MRN: [id number] Date of Birth: June 26, 1971  [facility] Care [facility]) CM/SW Contact:  Ewing Schlein, LCSW Phone Number: 03/16/2023, 12:43 PM  Clinical Narrative: PT evaluation recommended a rolling walker. Patient agreeable to [employer] delivering rolling walker to room. CSW made DME referral to Zack with [employer]. [employer] to deliver rolling walker to patient's room. TOC signing off.  Final next level of care: Home/Self Care Barriers to Discharge: No Barriers Identified  Patient Goals and CMS Choice CMS Medicare.gov Compare Post Acute Care list provided to:: Patient Choice offered to / list presented to : Patient  Discharge Plan and Services Additional resources added to the After Visit Summary for       DME Arranged: Walker rolling DME Agency: [employer] Date DME Agency Contacted: 03/16/23 Time DME Agency Contacted: 1209 Representative spoke with at DME Agency: Zack  Social Determinants of Health (SDOH) Interventions SDOH Screenings   Food Insecurity: Patient Declined (03/13/2023)  Housing: Patient Declined (03/13/2023)  Transportation Needs: Patient Declined (03/13/2023)  Utilities: Patient Declined (03/13/2023)  Alcohol Screen: Low Risk  (04/11/2020)  Depression (PHQ2-9): Low Risk  (12/30/2022)  Tobacco Use: Medium Risk (03/13/2023)   Readmission Risk Interventions     No data to display

## 2023-03-16 NOTE — Discharge Summary (Signed)
Physician Discharge Summary  Patient ID: Cheryl Scott MRN: [id number] DOB/AGE: [birth year] 52 y.o.  Admit date: 03/13/2023 Discharge date: 03/16/2023  Admission Diagnoses:  Discharge Diagnoses:  Principal Problem:   Diverticular disease   Discharged Condition: good  Hospital Course: Patient was admitted to the med surg floor after surgery.  Diet was advanced as tolerated.  Patient began to have bowel function on postop day 1.  By postop day 3, she was tolerating a solid diet and pain was controlled with oral medications.  She was urinating without difficulty and ambulating without assistance.  Patient was felt to be in stable condition for discharge to home.   Consults: None  Significant Diagnostic Studies: labs: cbc, bmet  Treatments: IV hydration, analgesia: Dilaudid, and surgery: Robotic Sigmoidectomy  Discharge Exam: Blood pressure 116/71, pulse (!) 59, temperature 98.2 F (36.8 C), temperature source Oral, resp. rate 18, height 5' 7.5" (1.715 m), weight 89.4 kg, last menstrual period 04/30/2021, SpO2 99%. General appearance: alert and cooperative GI: soft, non-distended Incision/Wound: clean, dry, intact  Disposition: Discharge disposition: 01-Home or Self Care        Allergies as of 03/16/2023       Reactions   Diuretic [buchu-cornsilk-ch Grass-hydran] Anaphylaxis   Rituxan [rituximab] Anaphylaxis   Ace Inhibitors Swelling   Codeine Itching   Lisinopril Swelling   Penicillins Hives, Itching   Seroquel [quetiapine] Other (See Comments)   Hypotension, alt mental status   Latex Rash        Medication List     STOP taking these medications    cephALEXin 500 MG capsule Commonly known as: KEFLEX   metroNIDAZOLE 500 MG tablet Commonly known as: FLAGYL   neomycin 500 MG tablet Commonly known as: MYCIFRADIN       TAKE these medications    acetaminophen 500 MG tablet Commonly known as: TYLENOL Take 1,000 mg by mouth as needed for mild pain.    atorvastatin 20 MG tablet Commonly known as: LIPITOR Take 1 tablet (20 mg total) by mouth daily.   Berberine Chloride 500 MG Caps Take 500-1,000 mg by mouth daily.   bifidobacterium infantis capsule TAKE 1 CAPSULE BY MOUTH EVERY DAY   carvedilol 6.25 MG tablet Commonly known as: COREG Take 2 tablets (12.5 mg total) by mouth 2 (two) times daily with a meal.   cetirizine 10 MG tablet Commonly known as: ZYRTEC Take 1 tablet (10 mg total) by mouth daily. What changed:  when to take this reasons to take this   chlorthalidone 25 MG tablet Commonly known as: HYGROTON Take 12.5 mg by mouth daily.   EPINEPHrine 0.3 mg/0.3 mL Soaj injection Commonly known as: EPI-PEN Inject into the muscle.   ferrous sulfate 325 (65 FE) MG EC tablet Commonly known as: Fe Tabs Take 1 tablet (325 mg total) by mouth daily with breakfast.   GaviLAX 17 GM/SCOOP powder Generic drug: polyethylene glycol powder Take by mouth.   lamoTRIgine 200 MG tablet Commonly known as: LAMICTAL Take 200 mg by mouth 2 (two) times daily.   linaclotide 72 MCG capsule Commonly known as: Linzess TAKE 1 CAPSULE BY MOUTH DAILY BEFORE BREAKFAST. What changed:  how much to take how to take this when to take this reasons to take this additional instructions   losartan 25 MG tablet Commonly known as: COZAAR Take 0.5 tablets (12.5 mg total) by mouth daily. What changed: how much to take   OIL OF OREGANO PO Take 4 drops by mouth daily.   oxyCODONE 5 MG  immediate release tablet Commonly known as: Oxy IR/ROXICODONE Take 1 tablet (5 mg total) by mouth every 6 (six) hours as needed for moderate pain, severe pain or breakthrough pain.   pantoprazole 40 MG tablet Commonly known as: PROTONIX TAKE 1 TABLET BY MOUTH TWICE A DAY BEFORE A MEAL What changed:  how much to take how to take this when to take this additional instructions   sertraline 100 MG tablet Commonly known as: ZOLOFT Take 1 tablet (100 mg total)  by mouth 2 (two) times daily.        Follow-up Information     Romie Levee, MD. Schedule an appointment as soon as possible for a visit in 2 week(s).   Specialties: General Surgery, Colon and Rectal Surgery Contact information: [address] [place] [address] [place] [address] [phone]                 Signed: Vanita Panda 03/16/2023, 8:29 AM

## 2023-03-17 ENCOUNTER — Telehealth: Payer: Self-pay

## 2023-03-17 LAB — SURGICAL PATHOLOGY

## 2023-03-17 NOTE — Transitions of Care (Post Inpatient/ED Visit) (Unsigned)
   03/17/2023  Name: Cheryl Scott MRN: [id number] DOB: Dec 22, 1970  Today's TOC FU Call Status: Today's TOC FU Call Status:: Unsuccessful Call (1st Attempt) Unsuccessful Call (1st Attempt) Date: 03/17/23  Attempted to reach the patient regarding the most recent Inpatient/ED visit.  Follow Up Plan: Additional outreach attempts will be made to reach the patient to complete the Transitions of Care (Post Inpatient/ED visit) call.   Signature Karena Addison, LPN [facility] Nurse Health Advisor Direct Dial [phone]

## 2023-03-18 NOTE — Transitions of Care (Post Inpatient/ED Visit) (Unsigned)
   03/18/2023  Name: Cheryl Scott MRN: [id number] DOB: 02-03-1971  Today's TOC FU Call Status: Today's TOC FU Call Status:: Unsuccessful Call (2nd Attempt) Unsuccessful Call (1st Attempt) Date: 03/17/23 Unsuccessful Call (2nd Attempt) Date: 03/18/23  Attempted to reach the patient regarding the most recent Inpatient/ED visit.  Follow Up Plan: Additional outreach attempts will be made to reach the patient to complete the Transitions of Care (Post Inpatient/ED visit) call.   Signature Karena Addison, LPN [facility] Nurse Health Advisor Direct Dial [phone]

## 2023-03-19 NOTE — Transitions of Care (Post Inpatient/ED Visit) (Signed)
   03/19/2023  Name: Cheryl Scott MRN: [id number] DOB: 02-Oct-1970  Today's TOC FU Call Status: Today's TOC FU Call Status:: Unsuccessful Call (3rd Attempt) Unsuccessful Call (1st Attempt) Date: 03/17/23 Unsuccessful Call (2nd Attempt) Date: 03/18/23 Unsuccessful Call (3rd Attempt) Date: 03/19/23  Attempted to reach the patient regarding the most recent Inpatient/ED visit.  Follow Up Plan: No further outreach attempts will be made at this time. We have been unable to contact the patient.  Signature Karena Addison, LPN [facility] Nurse Health Advisor Direct Dial [phone]

## 2023-05-21 NOTE — Plan of Care (Signed)
[facility] Tonsillectomy/Adenoidectomy, Postoperative PEDS care plan entered in error.

## 2023-05-26 ENCOUNTER — Other Ambulatory Visit: Payer: Self-pay | Admitting: Family Medicine

## 2023-05-26 DIAGNOSIS — Z1231 Encounter for screening mammogram for malignant neoplasm of breast: Secondary | ICD-10-CM

## 2023-06-02 ENCOUNTER — Encounter: Payer: Self-pay | Admitting: Family Medicine

## 2023-06-02 ENCOUNTER — Ambulatory Visit (INDEPENDENT_AMBULATORY_CARE_PROVIDER_SITE_OTHER): Payer: 59 | Admitting: Family Medicine

## 2023-06-02 VITALS — BP 122/80 | HR 50 | Temp 97.7°F | Ht 67.0 in | Wt 216.0 lb

## 2023-06-02 DIAGNOSIS — E782 Mixed hyperlipidemia: Secondary | ICD-10-CM | POA: Diagnosis not present

## 2023-06-02 DIAGNOSIS — R3915 Urgency of urination: Secondary | ICD-10-CM | POA: Diagnosis not present

## 2023-06-02 DIAGNOSIS — E1159 Type 2 diabetes mellitus with other circulatory complications: Secondary | ICD-10-CM | POA: Diagnosis not present

## 2023-06-02 DIAGNOSIS — I10 Essential (primary) hypertension: Secondary | ICD-10-CM

## 2023-06-02 DIAGNOSIS — E119 Type 2 diabetes mellitus without complications: Secondary | ICD-10-CM

## 2023-06-02 LAB — MICROSCOPIC MESSAGE

## 2023-06-02 LAB — URINALYSIS, ROUTINE W REFLEX MICROSCOPIC
Bacteria, UA: NONE SEEN /[HPF]
Bilirubin Urine: NEGATIVE
Glucose, UA: NEGATIVE
Hyaline Cast: NONE SEEN /[LPF]
Ketones, ur: NEGATIVE
Leukocytes,Ua: NEGATIVE
Nitrite: NEGATIVE
Specific Gravity, Urine: 1.015 (ref 1.001–1.035)
WBC, UA: NONE SEEN /[HPF] (ref 0–5)
pH: 5.5 (ref 5.0–8.0)

## 2023-06-02 MED ORDER — LOSARTAN POTASSIUM 25 MG PO TABS: 25.0000 mg | ORAL_TABLET | Freq: Every day | ORAL | Status: AC

## 2023-06-02 MED ORDER — SERTRALINE HCL 100 MG PO TABS: 100.0000 mg | ORAL_TABLET | Freq: Two times a day (BID) | ORAL | 1 refills | Status: DC

## 2023-06-02 MED ORDER — PANTOPRAZOLE SODIUM 40 MG PO TBEC: 40.0000 mg | DELAYED_RELEASE_TABLET | Freq: Every day | ORAL | Status: AC

## 2023-06-02 MED ORDER — LAMOTRIGINE 200 MG PO TABS: 200.0000 mg | ORAL_TABLET | Freq: Two times a day (BID) | ORAL | 1 refills | Status: DC

## 2023-06-02 NOTE — Assessment & Plan Note (Signed)
Well controlled. Last A1c 6.5%. No medications at this time. No BG log today. Aware of s/s of hypoglycemia and hyperglycemia. A1c and uACR today. Foot exam today. Vaccines declined. Retinal eye exam due next month. Recommend heart healthy diet such as Mediterranean diet with whole grains, fruits, vegetable, fish, lean meats, nuts, and olive oil. Limit salt. Encouraged moderate walking, 3-5 times/week for 30-50 minutes each session. Aim for at least 150 minutes.week. Goal should be pace of 3 miles/hours, or walking 1.5 miles in 30 minutes. Seek medical care for urinary frequency, extreme thirst, vision changes, lightheadedness, dizziness.
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
## 2023-06-02 NOTE — Assessment & Plan Note (Signed)
Chronic. Well controlled. She is not taking Coreg as prescribed, counseled on medication compliance. No red flag symptoms. Continue Losartan 25mg  daily, Atorvastatin 20mg  daily, and Coreg 12.5mg  BID. Recommend heart healthy diet such as Mediterranean diet with whole grains, fruits, vegetable, fish, lean meats, nuts, and olive oil. Limit salt. Encouraged moderate walking, 3-5 times/week for 30-50 minutes each session. Aim for at least 150 minutes.week. Goal should be pace of 3 miles/hours, or walking 1.5 miles in 30 minutes. Avoid tobacco products. Avoid excess alcohol. Take medications as prescribed and bring medications and blood pressure log with cuff to each office visit. Seek medical care for chest pain, palpitations, shortness of breath with exertion, dizziness/lightheadedness, vision changes, recurrent headaches, or swelling of extremities. Follow up in 4 months.

## 2023-06-02 NOTE — Assessment & Plan Note (Signed)
Not currently taking Atorvastatin. Fasting labs ordered. I recommend consuming a heart healthy diet such as Mediterranean diet or DASH diet with whole grains, fruits, vegetable, fish, lean meats, nuts, and olive oil. Limit sweets and processed foods. I also encourage moderate intensity exercise 150 minutes weekly. This is 3-5 times weekly for 30-50 minutes each session. Goal should be pace of 3 miles/hours, or walking 1.5 miles in 30 minutes. The 10-year ASCVD risk score (Arnett DK, et al., 2019) is: 7.6%

## 2023-06-03 ENCOUNTER — Other Ambulatory Visit: Payer: 59

## 2023-06-03 ENCOUNTER — Ambulatory Visit: Payer: 59

## 2023-06-04 LAB — CBC WITH DIFFERENTIAL/PLATELET
Absolute Lymphocytes: 1888 {cells}/uL (ref 850–3900)
Absolute Monocytes: 328 {cells}/uL (ref 200–950)
Basophils Absolute: 31 {cells}/uL (ref 0–200)
Basophils Relative: 0.6 %
Eosinophils Absolute: 182 {cells}/uL (ref 15–500)
Eosinophils Relative: 3.5 %
HCT: 36.5 % (ref 35.0–45.0)
Hemoglobin: 11.8 g/dL (ref 11.7–15.5)
MCH: 27.8 pg (ref 27.0–33.0)
MCHC: 32.3 g/dL (ref 32.0–36.0)
MCV: 85.9 fL (ref 80.0–100.0)
MPV: 9.9 fL (ref 7.5–12.5)
Monocytes Relative: 6.3 %
Neutro Abs: 2772 {cells}/uL (ref 1500–7800)
Neutrophils Relative %: 53.3 %
Platelets: 265 10*3/uL (ref 140–400)
RBC: 4.25 10*6/uL (ref 3.80–5.10)
RDW: 13.7 % (ref 11.0–15.0)
Total Lymphocyte: 36.3 %
WBC: 5.2 10*3/uL (ref 3.8–10.8)

## 2023-06-04 LAB — COMPLETE METABOLIC PANEL WITH GFR
AG Ratio: 1.4 (calc) (ref 1.0–2.5)
ALT: 8 U/L (ref 6–29)
AST: 10 U/L (ref 10–35)
Albumin: 3.8 g/dL (ref 3.6–5.1)
Alkaline phosphatase (APISO): 114 U/L (ref 37–153)
BUN/Creatinine Ratio: 13 (calc) (ref 6–22)
BUN: 26 mg/dL — ABNORMAL HIGH (ref 7–25)
CO2: 27 mmol/L (ref 20–32)
Calcium: 9.2 mg/dL (ref 8.6–10.4)
Chloride: 107 mmol/L (ref 98–110)
Creat: 2.01 mg/dL — ABNORMAL HIGH (ref 0.50–1.03)
Globulin: 2.7 g/dL (ref 1.9–3.7)
Glucose, Bld: 99 mg/dL (ref 65–99)
Potassium: 4.2 mmol/L (ref 3.5–5.3)
Sodium: 142 mmol/L (ref 135–146)
Total Bilirubin: 0.4 mg/dL (ref 0.2–1.2)
Total Protein: 6.5 g/dL (ref 6.1–8.1)
eGFR: 29 mL/min/{1.73_m2} — ABNORMAL LOW (ref >=60)

## 2023-06-04 LAB — LIPID PANEL
Cholesterol: 222 mg/dL — ABNORMAL HIGH (ref <200)
HDL: 64 mg/dL (ref >=50)
LDL Cholesterol (Calc): 130 mg/dL — ABNORMAL HIGH
Non-HDL Cholesterol (Calc): 158 mg/dL — ABNORMAL HIGH (ref <130)
Total CHOL/HDL Ratio: 3.5 (calc) (ref <5.0)
Triglycerides: 166 mg/dL — ABNORMAL HIGH (ref <150)

## 2023-06-04 LAB — HEMOGLOBIN A1C
Hgb A1c MFr Bld: 5.6 %{Hb} (ref <5.7)
Mean Plasma Glucose: 114 mg/dL
eAG (mmol/L): 6.3 mmol/L

## 2023-06-05 ENCOUNTER — Ambulatory Visit: Payer: 59

## 2023-07-16 ENCOUNTER — Telehealth: Payer: Self-pay

## 2023-07-16 NOTE — Telephone Encounter (Signed)
Copied from CRM [id number]. Topic: Clinical - Medication Question >> Jul 16, 2023  4:15 PM Antony Haste wrote: Reason for CRM: PT completed a follow-up on her missed call, informed PT of response to medical advice, PT wants to determine if there is another medication she can take besides acetaminophen (TYLENOL) 500 MG tablet, PT states the pain is not subsiding?  Callback [phone]

## 2023-07-16 NOTE — Telephone Encounter (Signed)
Copied from CRM [id number]. Topic: Clinical - Medical Advice >> Jul 16, 2023  9:12 AM Herbert Seta B wrote: Reason for CRM: Patient calling because has chronic pain in L shoulder/back from bone spurs. Wants to know what medication can take or if can be seen today.

## 2023-07-24 ENCOUNTER — Telehealth: Payer: Self-pay

## 2023-07-24 DIAGNOSIS — B009 Herpesviral infection, unspecified: Secondary | ICD-10-CM | POA: Insufficient documentation

## 2023-07-24 NOTE — Telephone Encounter (Signed)
LVM re diabetic eye exam

## 2023-07-28 ENCOUNTER — Ambulatory Visit: Payer: 59 | Admitting: Family Medicine

## 2023-07-28 ENCOUNTER — Encounter: Payer: Self-pay | Admitting: Family Medicine

## 2023-07-28 VITALS — BP 142/86 | HR 61 | Temp 98.0°F | Ht 67.0 in | Wt 228.0 lb

## 2023-07-28 DIAGNOSIS — M79622 Pain in left upper arm: Secondary | ICD-10-CM

## 2023-07-28 MED ORDER — TRIAMCINOLONE ACETONIDE 40 MG/ML IJ SUSP
80.0000 mg | Freq: Once | INTRAMUSCULAR | Status: AC
Start: 2023-07-28 — End: 2023-07-28
  Administered 2023-07-28: 80 mg via INTRA_ARTICULAR
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
## 2023-07-28 NOTE — Addendum Note (Signed)
 Addended by: Venia Carbon K on: 07/28/2023 03:06 PM   Modules accepted: Orders

## 2023-08-26 ENCOUNTER — Telehealth: Payer: 59 | Admitting: Physician Assistant

## 2023-08-26 DIAGNOSIS — B9689 Other specified bacterial agents as the cause of diseases classified elsewhere: Secondary | ICD-10-CM

## 2023-08-26 DIAGNOSIS — R051 Acute cough: Secondary | ICD-10-CM

## 2023-08-26 DIAGNOSIS — J019 Acute sinusitis, unspecified: Secondary | ICD-10-CM | POA: Diagnosis not present

## 2023-08-26 DIAGNOSIS — B379 Candidiasis, unspecified: Secondary | ICD-10-CM

## 2023-08-26 DIAGNOSIS — T3695XA Adverse effect of unspecified systemic antibiotic, initial encounter: Secondary | ICD-10-CM

## 2023-08-26 MED ORDER — DOXYCYCLINE HYCLATE 100 MG PO TABS: 100.0000 mg | ORAL_TABLET | Freq: Two times a day (BID) | ORAL | 0 refills | Status: DC

## 2023-08-26 MED ORDER — FLUCONAZOLE 150 MG PO TABS: 150.0000 mg | ORAL_TABLET | ORAL | 0 refills | Status: DC | PRN

## 2023-08-26 MED ORDER — PROMETHAZINE-DM 6.25-15 MG/5ML PO SYRP
5.0000 mL | ORAL_SOLUTION | Freq: Four times a day (QID) | ORAL | 0 refills | Status: DC | PRN
Start: 2023-08-26 — End: 2024-04-20

## 2023-08-26 NOTE — Progress Notes (Signed)

## 2023-08-26 NOTE — Addendum Note (Signed)
Addended by: Margaretann Loveless on: 08/26/2023 01:57 PM   Modules accepted: Orders

## 2023-08-29 ENCOUNTER — Other Ambulatory Visit: Payer: Self-pay | Admitting: Family Medicine

## 2023-09-25 ENCOUNTER — Other Ambulatory Visit: Payer: 59

## 2023-09-25 DIAGNOSIS — Z0000 Encounter for general adult medical examination without abnormal findings: Secondary | ICD-10-CM

## 2023-09-30 ENCOUNTER — Other Ambulatory Visit (HOSPITAL_COMMUNITY)
Admission: RE | Admit: 2023-09-30 | Discharge: 2023-09-30 | Disposition: A | Discharge status: Home / Self Care | Source: Ambulatory Visit | Attending: Nephrology | Admitting: Nephrology

## 2023-09-30 DIAGNOSIS — R809 Proteinuria, unspecified: Secondary | ICD-10-CM | POA: Diagnosis not present

## 2023-09-30 DIAGNOSIS — E211 Secondary hyperparathyroidism, not elsewhere classified: Secondary | ICD-10-CM | POA: Diagnosis not present

## 2023-09-30 DIAGNOSIS — N189 Chronic kidney disease, unspecified: Secondary | ICD-10-CM | POA: Diagnosis present

## 2023-09-30 DIAGNOSIS — D631 Anemia in chronic kidney disease: Secondary | ICD-10-CM | POA: Diagnosis not present

## 2023-09-30 LAB — PROTEIN / CREATININE RATIO, URINE
Creatinine, Urine: 72 mg/dL
Protein Creatinine Ratio: 1.11 mg/mg{creat} — ABNORMAL HIGH (ref 0.00–0.15)
Total Protein, Urine: 80 mg/dL

## 2023-09-30 LAB — RENAL FUNCTION PANEL
Albumin: 3.6 g/dL (ref 3.5–5.0)
Anion gap: 8 (ref 5–15)
BUN: 31 mg/dL — ABNORMAL HIGH (ref 6–20)
CO2: 25 mmol/L (ref 22–32)
Calcium: 9.1 mg/dL (ref 8.9–10.3)
Chloride: 105 mmol/L (ref 98–111)
Creatinine, Ser: 2.25 mg/dL — ABNORMAL HIGH (ref 0.44–1.00)
GFR, Estimated: 25 mL/min — ABNORMAL LOW (ref >60)
Glucose, Bld: 109 mg/dL — ABNORMAL HIGH (ref 70–99)
Phosphorus: 3 mg/dL (ref 2.5–4.6)
Potassium: 3.9 mmol/L (ref 3.5–5.1)
Sodium: 138 mmol/L (ref 135–145)

## 2023-09-30 LAB — URINALYSIS, W/ REFLEX TO CULTURE (INFECTION SUSPECTED)
Bilirubin Urine: NEGATIVE
Glucose, UA: NEGATIVE mg/dL
Hgb urine dipstick: NEGATIVE
Ketones, ur: NEGATIVE mg/dL
Leukocytes,Ua: NEGATIVE
Nitrite: NEGATIVE
Protein, ur: 100 mg/dL — AB
Specific Gravity, Urine: 1.011 (ref 1.005–1.030)
pH: 6 (ref 5.0–8.0)

## 2023-09-30 LAB — CBC
HCT: 40.3 % (ref 36.0–46.0)
Hemoglobin: 12.4 g/dL (ref 12.0–15.0)
MCH: 27.1 pg (ref 26.0–34.0)
MCHC: 30.8 g/dL (ref 30.0–36.0)
MCV: 88.2 fL (ref 80.0–100.0)
Platelets: 248 10*3/uL (ref 150–400)
RBC: 4.57 MIL/uL (ref 3.87–5.11)
RDW: 15.1 % (ref 11.5–15.5)
WBC: 5.8 10*3/uL (ref 4.0–10.5)
nRBC: 0 % (ref 0.0–0.2)

## 2023-10-01 ENCOUNTER — Encounter: Payer: 59 | Admitting: Family Medicine

## 2023-10-01 LAB — PARATHYROID HORMONE, INTACT (NO CA): PTH: 47 pg/mL (ref 15–65)

## 2023-10-20 ENCOUNTER — Encounter: Payer: Self-pay | Admitting: Family Medicine

## 2023-10-20 ENCOUNTER — Other Ambulatory Visit: Payer: Self-pay

## 2023-10-20 ENCOUNTER — Ambulatory Visit (INDEPENDENT_AMBULATORY_CARE_PROVIDER_SITE_OTHER): Admitting: Family Medicine

## 2023-10-20 VITALS — BP 122/82 | HR 51 | Ht 67.0 in | Wt 241.2 lb

## 2023-10-20 DIAGNOSIS — I10 Essential (primary) hypertension: Secondary | ICD-10-CM

## 2023-10-20 DIAGNOSIS — Z23 Encounter for immunization: Secondary | ICD-10-CM

## 2023-10-20 DIAGNOSIS — E7800 Pure hypercholesterolemia, unspecified: Secondary | ICD-10-CM

## 2023-10-20 DIAGNOSIS — N184 Chronic kidney disease, stage 4 (severe): Secondary | ICD-10-CM | POA: Diagnosis not present

## 2023-10-20 DIAGNOSIS — Z0000 Encounter for general adult medical examination without abnormal findings: Secondary | ICD-10-CM | POA: Diagnosis not present

## 2023-10-20 LAB — LIPID PANEL
Cholesterol: 218 mg/dL — ABNORMAL HIGH (ref <200)
HDL: 70 mg/dL (ref >=50)
LDL Cholesterol (Calc): 119 mg/dL — ABNORMAL HIGH
Non-HDL Cholesterol (Calc): 148 mg/dL — ABNORMAL HIGH (ref <130)
Total CHOL/HDL Ratio: 3.1 (calc) (ref <5.0)
Triglycerides: 175 mg/dL — ABNORMAL HIGH (ref <150)

## 2023-10-20 LAB — COMPLETE METABOLIC PANEL WITH GFR
AG Ratio: 1.3 (calc) (ref 1.0–2.5)
ALT: 9 U/L (ref 6–29)
AST: 10 U/L (ref 10–35)
Albumin: 3.8 g/dL (ref 3.6–5.1)
Alkaline phosphatase (APISO): 128 U/L (ref 37–153)
BUN/Creatinine Ratio: 13 (calc) (ref 6–22)
BUN: 31 mg/dL — ABNORMAL HIGH (ref 7–25)
CO2: 26 mmol/L (ref 20–32)
Calcium: 8.9 mg/dL (ref 8.6–10.4)
Chloride: 106 mmol/L (ref 98–110)
Creat: 2.3 mg/dL — ABNORMAL HIGH (ref 0.50–1.03)
Globulin: 2.9 g/dL (ref 1.9–3.7)
Glucose, Bld: 90 mg/dL (ref 65–99)
Potassium: 4.5 mmol/L (ref 3.5–5.3)
Sodium: 140 mmol/L (ref 135–146)
Total Bilirubin: 0.3 mg/dL (ref 0.2–1.2)
Total Protein: 6.7 g/dL (ref 6.1–8.1)
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
## 2023-10-20 NOTE — Addendum Note (Signed)
 Addended by: Venia Carbon K on: 10/20/2023 10:50 AM   Modules accepted: Orders

## 2023-12-11 ENCOUNTER — Other Ambulatory Visit: Payer: Self-pay | Admitting: Family Medicine

## 2023-12-11 DIAGNOSIS — E119 Type 2 diabetes mellitus without complications: Secondary | ICD-10-CM

## 2024-03-06 ENCOUNTER — Other Ambulatory Visit: Payer: Self-pay | Admitting: Family Medicine

## 2024-03-13 ENCOUNTER — Other Ambulatory Visit: Payer: Self-pay | Admitting: Family Medicine

## 2024-03-13 DIAGNOSIS — E119 Type 2 diabetes mellitus without complications: Secondary | ICD-10-CM

## 2024-03-25 ENCOUNTER — Other Ambulatory Visit: Payer: Self-pay

## 2024-03-29 MED ORDER — SERTRALINE HCL 100 MG PO TABS: 100.0000 mg | ORAL_TABLET | Freq: Two times a day (BID) | ORAL | 1 refills | Status: AC

## 2024-04-20 ENCOUNTER — Ambulatory Visit: Admitting: Family Medicine

## 2024-04-20 ENCOUNTER — Encounter: Payer: Self-pay | Admitting: Family Medicine

## 2024-04-20 VITALS — BP 115/78 | HR 56 | Temp 97.4°F | Ht 67.0 in | Wt 261.6 lb

## 2024-04-20 DIAGNOSIS — J069 Acute upper respiratory infection, unspecified: Secondary | ICD-10-CM

## 2024-04-20 DIAGNOSIS — R42 Dizziness and giddiness: Secondary | ICD-10-CM

## 2024-04-20 MED ORDER — MECLIZINE HCL 25 MG PO TABS: 25.0000 mg | ORAL_TABLET | Freq: Three times a day (TID) | ORAL | 0 refills | Status: AC | PRN

## 2024-04-20 NOTE — Assessment & Plan Note (Signed)
 Refill provided for meclizine .
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
## 2024-04-20 NOTE — Assessment & Plan Note (Signed)
 Recent covid diagnosis. Symptoms consistent with viral URI. No s/s of bacterial infection. Continue supportive care. Increase fluid intake with water  or electrolyte solution like pedialyte. Encouraged acetaminophen  as needed for fever/pain. Encouraged salt water  gargling, chloraseptic spray and throat lozenges. Encouraged OTC guaifenesin. Encouraged saline sinus flushes and/or neti with humidified air.  Follow up PRN

## 2024-04-21 ENCOUNTER — Other Ambulatory Visit

## 2024-05-25 ENCOUNTER — Ambulatory Visit: Payer: Self-pay

## 2024-05-25 NOTE — Telephone Encounter (Signed)
 FYI Only or Action Required?: FYI only for provider: appointment scheduled on 05/26/24.  Patient was last seen in primary care on 04/20/2024 by Kayla Jeoffrey RAMAN, FNP.  Called Nurse Triage reporting Knee Pain.  Symptoms began several weeks ago.  Interventions attempted: Rest, hydration, or home remedies.  Symptoms are: gradually worsening.  Triage Disposition: See PCP When Office is Open (Within 3 Days)  Patient/caregiver understands and will follow disposition?: Yes    Copied from CRM #[id number]. Topic: Clinical - Red Word Triage >> May 25, 2024 12:42 PM Tobias L wrote: Red Word that prompted transfer to Nurse Triage: unable to walk due to knee pain Reason for Disposition  [1] MODERATE pain (e.g., interferes with normal activities, limping) AND [2] present > 3 days  Answer Assessment - Initial Assessment Questions 1. LOCATION and RADIATION: Where is the pain located?      Bilateral knees  2. QUALITY: What does the pain feel like?  (e.g., sharp, dull, aching, burning)     aching 3. SEVERITY: How bad is the pain? What does it keep you from doing?   (Scale 1-10; or mild, moderate, severe)     Moderate-starting to get difficult to  climb stairs  4. ONSET: When did the pain start? Does it come and go, or is it there all the time?     Chronic  5. RECURRENT: Have you had this pain before? If Yes, ask: When, and what happened then?     yes 6. SETTING: Has there been any recent work, exercise or other activity that involved that part of the body?       7. AGGRAVATING FACTORS: What makes the knee pain worse? (e.g., walking, climbing stairs, running)     Climbing stairs  8. ASSOCIATED SYMPTOMS: Is there any swelling or redness of the knee?     Mild swelling to right  9. OTHER SYMPTOMS: Do you have any other symptoms? (e.g., calf pain, chest pain, difficulty breathing, fever)     Denies  10. PREGNANCY: Is there any chance you are pregnant? When was your last  menstrual period?  Protocols used: Knee Pain-A-AH

## 2024-05-26 ENCOUNTER — Ambulatory Visit (INDEPENDENT_AMBULATORY_CARE_PROVIDER_SITE_OTHER): Admitting: Family Medicine

## 2024-05-26 ENCOUNTER — Ambulatory Visit: Admitting: Family Medicine

## 2024-05-26 ENCOUNTER — Encounter: Payer: Self-pay | Admitting: Family Medicine

## 2024-05-26 VITALS — BP 115/78 | HR 48 | Temp 97.8°F | Ht 67.0 in | Wt 269.2 lb

## 2024-05-26 DIAGNOSIS — G8929 Other chronic pain: Secondary | ICD-10-CM | POA: Diagnosis not present

## 2024-05-26 DIAGNOSIS — M25561 Pain in right knee: Secondary | ICD-10-CM

## 2024-05-26 DIAGNOSIS — M25562 Pain in left knee: Secondary | ICD-10-CM | POA: Diagnosis not present
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
## 2024-05-26 NOTE — Assessment & Plan Note (Signed)
 No red flags on exam. Will obtain x-rays bilaterally. Referral to ortho. Consider PT or steroid injections. NSAIDs contraindicated. Encouraged weight management

## 2024-06-18 ENCOUNTER — Other Ambulatory Visit: Payer: Self-pay | Admitting: Family Medicine

## 2024-06-18 DIAGNOSIS — E119 Type 2 diabetes mellitus without complications: Secondary | ICD-10-CM

## 2024-08-18 ENCOUNTER — Ambulatory Visit: Payer: Self-pay

## 2024-08-18 NOTE — Telephone Encounter (Signed)
 FYI Only or Action Required?: FYI only for provider: appointment scheduled on 1/23.  Patient was last seen in primary care on 05/26/2024 by Kayla Jeoffrey RAMAN, FNP.  Called Nurse Triage reporting Sore Throat and Cough.  Symptoms began several days ago.  Interventions attempted: OTC medications: Alka seltzer and Dietary changes.  Symptoms are: gradually worsening.  Triage Disposition: See Physician Within 24 Hours  Patient/caregiver understands and will follow disposition?: Yes    3 days ago onset of cough with yellow mucus, sore throat and headache. Had chills last night, had not checked temperature. Hoarse voice. No SOB or CP. Scheduled appt with different provider at home office tomorrow d/t no PCP availability within timeframe. Advised to call back for worsening symptoms.     Message from [place] R sent at 08/18/2024  4:04 PM EST  Reason for Triage: Yellow Phlegm   Lose her voice, sore throat, chills, coughing yellow phlegm, headache   Reason for Disposition  SEVERE coughing spells (e.g., whooping sound after coughing, vomiting after coughing)    Mild cough but with sore throat and loss of voice  Answer Assessment - Initial Assessment Questions 1. ONSET: When did the cough begin?      3 days ago  2. SEVERITY: How bad is the cough today?      Mild  3. SPUTUM: Describe the color of your sputum (e.g., none, dry cough; clear, white, yellow, green)     Yellow  4. HEMOPTYSIS: Are you coughing up any blood? If Yes, ask: How much? (e.g., flecks, streaks, tablespoons, etc.)     Denies  5. DIFFICULTY BREATHING: Are you having difficulty breathing? If Yes, ask: How bad is it? (e.g., mild, moderate, severe)      Denies  6. FEVER: Do you have a fever? If Yes, ask: What is your temperature, how was it measured, and when did it start?     Has not measured it. Had chills last night  7. CARDIAC HISTORY: Do you have any history of heart disease? (e.g., heart  attack, congestive heart failure)      Htn  8. LUNG HISTORY: Do you have any history of lung disease?  (e.g., pulmonary embolus, asthma, emphysema)     OSA  9. PE RISK FACTORS: Do you have a history of blood clots? (or: recent major surgery, recent prolonged travel, bedridden)     Denies  10. OTHER SYMPTOMS: Do you have any other symptoms? (e.g., runny nose, wheezing, chest pain)       Headache 5/10 pain, sore throat  Protocols used: Cough - Acute Productive-A-AH

## 2024-08-19 ENCOUNTER — Ambulatory Visit: Admitting: Family Medicine

## 2024-08-19 ENCOUNTER — Telehealth: Admitting: Physician Assistant

## 2024-08-19 DIAGNOSIS — B9689 Other specified bacterial agents as the cause of diseases classified elsewhere: Secondary | ICD-10-CM | POA: Diagnosis not present

## 2024-08-19 DIAGNOSIS — J040 Acute laryngitis: Secondary | ICD-10-CM

## 2024-08-19 DIAGNOSIS — J208 Acute bronchitis due to other specified organisms: Secondary | ICD-10-CM

## 2024-08-19 DIAGNOSIS — B379 Candidiasis, unspecified: Secondary | ICD-10-CM

## 2024-08-19 DIAGNOSIS — T3695XA Adverse effect of unspecified systemic antibiotic, initial encounter: Secondary | ICD-10-CM

## 2024-08-19 MED ORDER — PREDNISONE 20 MG PO TABS: 40.0000 mg | ORAL_TABLET | Freq: Every day | ORAL | 0 refills | Status: AC

## 2024-08-19 MED ORDER — AZITHROMYCIN 250 MG PO TABS: ORAL_TABLET | ORAL | 0 refills | Status: AC

## 2024-08-19 MED ORDER — PROMETHAZINE-DM 6.25-15 MG/5ML PO SYRP: 5.0000 mL | ORAL_SOLUTION | Freq: Four times a day (QID) | ORAL | 0 refills | Status: AC | PRN

## 2024-08-19 MED ORDER — FLUCONAZOLE 150 MG PO TABS: 150.0000 mg | ORAL_TABLET | ORAL | 0 refills | Status: AC | PRN

## 2024-08-19 NOTE — Telephone Encounter (Signed)
 Patient missed appt today because she fell asleep. Calling back to reschedule. [facility] appt scheduled

## 2024-08-19 NOTE — Patient Instructions (Signed)
 " Cheryl Scott, thank you for joining Cheryl CHRISTELLA Dickinson, PA-C for today's virtual visit.  While this provider is not your primary care provider (PCP), if your PCP is located in our provider database this encounter information will be shared with them immediately following your visit.   A [facility] MyChart account gives you access to today's visit and all your visits, tests, and labs performed at [facility]  click here if you don't have a [facility] MyChart account or go to [email or web address]  Consent: (Patient) Cheryl Scott provided verbal consent for this virtual visit at the beginning of the encounter.  Current Medications:  Current Outpatient Medications:    azithromycin  (ZITHROMAX ) 250 MG tablet, Take 2 tablets on day 1, then 1 tablet daily on days 2 through 5, Disp: 6 tablet, Rfl: 0   fluconazole  (DIFLUCAN ) 150 MG tablet, Take 1 tablet (150 mg total) by mouth every 3 (three) days as needed., Disp: 2 tablet, Rfl: 0   predniSONE  (DELTASONE ) 20 MG tablet, Take 2 tablets (40 mg total) by mouth daily with breakfast., Disp: 10 tablet, Rfl: 0   promethazine -dextromethorphan (PROMETHAZINE -DM) 6.25-15 MG/5ML syrup, Take 5 mLs by mouth 4 (four) times daily as needed., Disp: 118 mL, Rfl: 0   acetaminophen  (TYLENOL ) 500 MG tablet, Take 1,000 mg by mouth as needed for mild pain., Disp: , Rfl:    atorvastatin  (LIPITOR) 20 MG tablet, TAKE 1 TABLET BY MOUTH EVERY DAY, Disp: 90 tablet, Rfl: 0   Berberine Chloride 500 MG CAPS, Take 500-1,000 mg by mouth daily., Disp: , Rfl:    bifidobacterium infantis (ALIGN) capsule, TAKE 1 CAPSULE BY MOUTH EVERY DAY, Disp: 28 capsule, Rfl: 1   carvedilol  (COREG ) 6.25 MG tablet, Take 2 tablets (12.5 mg total) by mouth 2 (two) times daily with a meal., Disp: 120 tablet, Rfl: 11   cetirizine  (ZYRTEC ) 10 MG tablet, Take 1 tablet (10 mg total) by mouth daily. (Patient taking differently: Take 10 mg by mouth as needed for allergies.), Disp: 30  tablet, Rfl: 0   chlorthalidone  (HYGROTON ) 25 MG tablet, Take 12.5 mg by mouth daily., Disp: , Rfl:    EPINEPHrine 0.3 mg/0.3 mL IJ SOAJ injection, Inject into the muscle., Disp: , Rfl:    ferrous sulfate  (FE TABS) 325 (65 FE) MG EC tablet, Take 1 tablet (325 mg total) by mouth daily with breakfast., Disp: 90 tablet, Rfl: 1   hydrOXYzine  (VISTARIL ) 25 MG capsule, Take 25 mg by mouth as needed for anxiety or itching., Disp: , Rfl:    lamoTRIgine  (LAMICTAL ) 200 MG tablet, TAKE 1 TABLET BY MOUTH TWICE A DAY, Disp: 180 tablet, Rfl: 0   linaclotide  (LINZESS ) 72 MCG capsule, TAKE 1 CAPSULE BY MOUTH DAILY BEFORE BREAKFAST. (Patient not taking: Reported on 05/26/2024), Disp: 90 capsule, Rfl: 1   losartan  (COZAAR ) 25 MG tablet, Take 1 tablet (25 mg total) by mouth daily., Disp: , Rfl:    meclizine  (ANTIVERT ) 25 MG tablet, Take 1 tablet (25 mg total) by mouth 3 (three) times daily as needed for dizziness or nausea., Disp: 30 tablet, Rfl: 0   OIL OF OREGANO PO, Take 4 drops by mouth daily., Disp: , Rfl:    pantoprazole  (PROTONIX ) 40 MG tablet, Take 1 tablet (40 mg total) by mouth daily. TAKE 1 TABLET BY MOUTH TWICE A DAY BEFORE A MEAL, Disp: , Rfl:    potassium chloride  SA (KLOR-CON  M20) 20 MEQ tablet, Take 20 mEq by mouth once a week., Disp: , Rfl:  sertraline  (ZOLOFT ) 100 MG tablet, Take 1 tablet (100 mg total) by mouth 2 (two) times daily., Disp: 180 tablet, Rfl: 1   Medications ordered in this encounter:  Meds ordered this encounter  Medications   azithromycin  (ZITHROMAX ) 250 MG tablet    Sig: Take 2 tablets on day 1, then 1 tablet daily on days 2 through 5    Dispense:  6 tablet    Refill:  0    Supervising Provider:   LAMPTEY, PHILIP O [[id number]]   predniSONE  (DELTASONE ) 20 MG tablet    Sig: Take 2 tablets (40 mg total) by mouth daily with breakfast.    Dispense:  10 tablet    Refill:  0    Supervising Provider:   LAMPTEY, PHILIP O [[id number]]   promethazine -dextromethorphan (PROMETHAZINE -DM)  6.25-15 MG/5ML syrup    Sig: Take 5 mLs by mouth 4 (four) times daily as needed.    Dispense:  118 mL    Refill:  0    Supervising Provider:   BLAISE ALEENE KIDD [[id number]]   fluconazole  (DIFLUCAN ) 150 MG tablet    Sig: Take 1 tablet (150 mg total) by mouth every 3 (three) days as needed.    Dispense:  2 tablet    Refill:  0    Supervising Provider:   LAMPTEY, PHILIP O [[id number]]     *If you need refills on other medications prior to your next appointment, please contact your pharmacy*  Follow-Up: Call back or seek an in-person evaluation if the symptoms worsen or if the condition fails to improve as anticipated.  [facility] [phone]  Other Instructions  Laryngitis  Laryngitis is inflammation of the vocal cords that causes symptoms such as hoarseness or loss of voice. The vocal cords are two bands of muscles in your throat. When you speak, these cords come together and vibrate. The vibrations come out through your mouth as sound. When your vocal cords are inflamed, your voice sounds different. Laryngitis can be temporary (acute) or long-term (chronic). Most cases of acute laryngitis improve with time. Chronic laryngitis is laryngitis that lasts for more than 3 weeks. What are the causes? Acute laryngitis may be caused by: A viral infection. Lots of talking, yelling, or singing. This is also called vocal strain. A bacterial infection. Chronic laryngitis may be caused by: Vocal strain or an injury to the vocal cords. Acid reflux (gastroesophageal reflux disease, or GERD). Allergies, a sinus infection, or postnasal drip. Smoking. Excessive alcohol use. Breathing in chemicals or dust. Growths on the vocal cords. What increases the risk? The following factors may make you more likely to develop this condition: Smoking. Alcohol abuse. Having allergies. Chronic irritants in the workplace, such as toxic fumes. What are the signs or symptoms? Symptoms of this  condition may include: Low, hoarse voice. Loss of voice. Dry cough. Sore or dry throat. Stuffy or congested nose. How is this diagnosed? This condition may be diagnosed based on: Your symptoms and a physical exam. Throat culture. Blood test. A procedure in which your health care provider looks at your vocal cords with a mirror or viewing tube (laryngoscopy). How is this treated? Treatment for laryngitis depends on what is causing it. Usually, treatment involves resting your voice and using medicines to soothe your throat. If your laryngitis is caused by a bacterial infection, you may need to take antibiotic medicine. If your laryngitis is caused by a growth, you may need to have a procedure to remove it. Follow these instructions  at home: Medicines Take over-the-counter and prescription medicines only as told by your health care provider. If you were prescribed an antibiotic medicine, take it as told by your health care provider. Do not stop taking the antibiotic even if you start to feel better. Use throat lozenges or sprays to soothe your throat as told by your health care provider. General instructions  Talk as little as possible. To do this: Write instead of talking. Do this until your voice is back to normal. Avoid whispering, which can cause vocal strain. Gargle with a mixture of salt and water  3-4 times a day or as needed. To make salt water , completely dissolve -1 tsp (3-6 g) of salt in 1 cup (237 mL) of warm water . Drink enough fluid to keep your urine pale yellow. Breathe in moist air. Use a humidifier if you live in a dry climate. Do not use any products that contain nicotine or tobacco. These products include cigarettes, chewing tobacco, and vaping devices, such as e-cigarettes. If you need help quitting, ask your health care provider. Contact a health care provider if: You have a fever. You have increasing pain. Your symptoms do not get better in 2 weeks. Get help  right away if: You cough up blood. You have difficulty swallowing. You have trouble breathing. Summary Laryngitis is inflammation of the vocal cords that causes symptoms such as hoarseness or loss of voice. Laryngitis can be temporary or long-term. Treatment for laryngitis depends on the cause. It often involves resting your voice and using medicine to soothe your throat. Get help right away if you have difficulty swallowing or breathing or if you cough up blood. This information is not intended to replace advice given to you by your health care provider. Make sure you discuss any questions you have with your health care provider. Document Revised: 10/01/2020 Document Reviewed: 10/01/2020 Elsevier Patient Education  2024 [employer]   Acute Bronchitis, Adult  Acute bronchitis is sudden inflammation of the main airways (bronchi) that come off the windpipe (trachea) in the lungs. The swelling causes the airways to get smaller and make more mucus than normal. This can make it hard to breathe and can cause coughing or noisy breathing (wheezing). Acute bronchitis may last several weeks. The cough may last longer. Allergies, asthma, and exposure to smoke may make the condition worse. What are the causes? This condition can be caused by germs and by substances that irritate the lungs, including: Cold and flu viruses. The most common cause of this condition is the virus that causes the common cold. Bacteria. This is less common. Breathing in substances that irritate the lungs, including: Smoke from cigarettes and other forms of tobacco. Dust and pollen. Fumes from household cleaning products, gases, or burned fuel. Indoor or outdoor air pollution. What increases the risk? The following factors may make you more likely to develop this condition: A weak body's defense system, also called the immune system. A condition that affects your lungs and breathing, such as asthma. What are the signs  or symptoms? Common symptoms of this condition include: Coughing. This may bring up clear, yellow, or green mucus from your lungs (sputum). Wheezing. Runny or stuffy nose. Having too much mucus in your lungs (chest congestion). Shortness of breath. Aches and pains, including sore throat or chest. How is this diagnosed? This condition is usually diagnosed based on: Your symptoms and medical history. A physical exam. You may also have other tests, including tests to rule out other conditions, such  as pneumonia. These tests include: A test of lung function. Test of a mucus sample to look for the presence of bacteria. Tests to check the oxygen level in your blood. Blood tests. Chest X-ray. How is this treated? Most cases of acute bronchitis clear up over time without treatment. Your health care provider may recommend: Drinking more fluids to help thin your mucus so it is easier to cough up. Taking inhaled medicine (inhaler) to improve air flow in and out of your lungs. Using a vaporizer or a humidifier. These are machines that add water  to the air to help you breathe better. Taking a medicine that thins mucus and clears congestion (expectorant). Taking a medicine that prevents or stops coughing (cough suppressant). It is not common to take an antibiotic medicine for this condition. Follow these instructions at home:  Take over-the-counter and prescription medicines only as told by your health care provider. Use an inhaler, vaporizer, or humidifier as told by your health care provider. Take two teaspoons (10 mL) of honey at bedtime to lessen coughing at night. Drink enough fluid to keep your urine pale yellow. Do not use any products that contain nicotine or tobacco. These products include cigarettes, chewing tobacco, and vaping devices, such as e-cigarettes. If you need help quitting, ask your health care provider. Get plenty of rest. Return to your normal activities as told by your  health care provider. Ask your health care provider what activities are safe for you. Keep all follow-up visits. This is important. How is this prevented? To lower your risk of getting this condition again: Wash your hands often with soap and water  for at least 20 seconds. If soap and water  are not available, use hand sanitizer. Avoid contact with people who have cold symptoms. Try not to touch your mouth, nose, or eyes with your hands. Avoid breathing in smoke or chemical fumes. Breathing smoke or chemical fumes will make your condition worse. Get the flu shot every year. Contact a health care provider if: Your symptoms do not improve after 2 weeks. You have trouble coughing up the mucus. Your cough keeps you awake at night. You have a fever. Get help right away if you: Cough up blood. Feel pain in your chest. Have severe shortness of breath. Faint or keep feeling like you are going to faint. Have a severe headache. Have a fever or chills that get worse. These symptoms may represent a serious problem that is an emergency. Do not wait to see if the symptoms will go away. Get medical help right away. Call your local emergency services (911 in the [place] Do not drive yourself to the hospital. Summary Acute bronchitis is inflammation of the main airways (bronchi) that come off the windpipe (trachea) in the lungs. The swelling causes the airways to get smaller and make more mucus than normal. Drinking more fluids can help thin your mucus so it is easier to cough up. Take over-the-counter and prescription medicines only as told by your health care provider. Do not use any products that contain nicotine or tobacco. These products include cigarettes, chewing tobacco, and vaping devices, such as e-cigarettes. If you need help quitting, ask your health care provider. Contact a health care provider if your symptoms do not improve after 2 weeks. This information is not intended to replace advice  given to you by your health care provider. Make sure you discuss any questions you have with your health care provider. Document Revised: 10/24/2021 Document Reviewed: 11/14/2020 Elsevier Patient Education  2024 [employer]   If you have been instructed to have an in-person evaluation today at a local Urgent Care facility, please use the link below. It will take you to a list of all of our available [facility], including address, phone number and hours of operation. Please do not delay care.  [facility]  If you or a family member do not have a primary care provider, use the link below to schedule a visit and establish care. When you choose a [facility] primary care physician or advanced practice provider, you gain a long-term partner in health. Find a Primary Care Provider  Learn more about [facility]'s in-office and virtual care options: [facility] - Get Care Now "

## 2024-08-19 NOTE — Progress Notes (Signed)
 " Virtual Visit Consent   Corretta D Scott, you are scheduled for a virtual visit with a [facility] provider today. Just as with appointments in the office, your consent must be obtained to participate. Your consent will be active for this visit and any virtual visit you may have with one of our providers in the next 365 days. If you have a MyChart account, a copy of this consent can be sent to you electronically.  As this is a virtual visit, video technology does not allow for your provider to perform a traditional examination. This may limit your provider's ability to fully assess your condition. If your provider identifies any concerns that need to be evaluated in person or the need to arrange testing (such as labs, EKG, etc.), we will make arrangements to do so. Although advances in technology are sophisticated, we cannot ensure that it will always work on either your end or our end. If the connection with a video visit is poor, the visit may have to be switched to a telephone visit. With either a video or telephone visit, we are not always able to ensure that we have a secure connection.  By engaging in this virtual visit, you consent to the provision of healthcare and authorize for your insurance to be billed (if applicable) for the services provided during this visit. Depending on your insurance coverage, you may receive a charge related to this service.  I need to obtain your verbal consent now. Are you willing to proceed with your visit today? [name] has provided verbal consent on 08/19/2024 for a virtual visit (video or telephone). [name], PA-C  Date: 08/19/2024 3:25 PM   Virtual Visit via Video Note   I, [name], connected with  Cheryl Scott  ([id number], Mar 27, 1971) on 08/19/24 at  3:15 PM EST by a video-enabled telemedicine application and verified that I am speaking with the correct person using two identifiers.  Location: Patient: Virtual Visit  Location Patient: Home Provider: Virtual Visit Location Provider: Home Office   I discussed the limitations of evaluation and management by telemedicine and the availability of in person appointments. The patient expressed understanding and agreed to proceed.    History of Present Illness: Cheryl Scott is a 54 y.o. who identifies as a female who was assigned female at birth, and is being seen today for cough, hoarse voice, sore throat.  HPI: URI  This is a new problem. The current episode started in the past 7 days (3 days, Tuesday night started to feel lightheaded while getting hair done). The problem has been rapidly worsening (worsened acutely today). There has been no fever. Associated symptoms include congestion, coughing, headaches, nausea (first day), a plugged ear sensation (left), rhinorrhea (and post nasal drainge; intermittent but thick), sinus pain (mild), a sore throat (reports throat is red) and wheezing (mild). Pertinent negatives include no chest pain, diarrhea, ear pain or vomiting. Treatments tried: dayquil, sudafed, honey with hot tea, ibuprofen .    Problems:  Patient Active Problem List   Diagnosis Date Noted   Chronic pain of both knees 05/26/2024   Viral URI 04/20/2024   Herpes simplex 07/24/2023   Diverticular disease 03/13/2023   Urinary urgency 12/30/2022   Iron  deficiency anemia, unspecified 11/26/2022   Recurrent UTI 11/20/2022   Acute cystitis with hematuria 11/20/2022   Vaginal irritation 10/22/2022   Vulvovaginal candidiasis 10/22/2022   Vitamin D  deficiency 10/28/2021   Immune-complex glomerulonephritis 10/28/2021   Establishing care  with new doctor, encounter for 09/25/2021   Diabetes mellitus (HCC) 08/28/2021   Secondary hyperparathyroidism 05/21/2021   Simple renal cyst 05/21/2021   Anemia of renal disease 05/21/2021   Monoclonal gammopathy of unknown significance (MGUS) 05/21/2021   Essential hypertension 04/04/2021   Anxiety 04/04/2021    Hyperlipidemia 04/04/2021   Anemia 04/04/2021   Morbid obesity (HCC) 03/30/2020   Peripheral edema 03/13/2020   Mild anemia 01/09/2020   Mixed anxiety and depressive disorder 12/15/2019   Nausea with vomiting 12/06/2019   Belching 12/06/2019   Chronic constipation 06/25/2017   Nasal septal deviation 05/21/2017   Nasal turbinate hypertrophy 05/21/2017   Vertigo 05/21/2017   Hypertriglyceridemia 01/05/2017   CKD (chronic kidney disease) stage 4, GFR 15-29 ml/min (HCC) 04/17/2016   Diabetes mellitus type 2, controlled, without complications (HCC) 12/21/2014   Cervical nerve root impingement 10/13/2014   Cervical neck pain with evidence of disc disease 07/13/2013   Obesity, Class III, BMI 40-49.9 (morbid obesity) (HCC)    Gastric ulcer 06/05/2013   Internal hemorrhoids 06/05/2013   Polypharmacy 05/10/2013   GERD (gastroesophageal reflux disease) 05/10/2013   FH: colon cancer 05/10/2013   Rectal bleeding 05/10/2013   OSA on CPAP    Proteinuria    OCD (obsessive compulsive disorder)     Allergies: Allergies[1] Medications: Current Medications[2]  Observations/Objective: Patient is well-developed, well-nourished in no acute distress.  Resting comfortably at home.  Head is normocephalic, atraumatic.  No labored breathing.  Speech is clear and coherent with logical content.  Patient is alert and oriented at baseline.  Very hoarse voice  Assessment and Plan: 1. Acute bacterial bronchitis (Primary) - azithromycin  (ZITHROMAX ) 250 MG tablet; Take 2 tablets on day 1, then 1 tablet daily on days 2 through 5  Dispense: 6 tablet; Refill: 0 - predniSONE  (DELTASONE ) 20 MG tablet; Take 2 tablets (40 mg total) by mouth daily with breakfast.  Dispense: 10 tablet; Refill: 0 - promethazine -dextromethorphan (PROMETHAZINE -DM) 6.25-15 MG/5ML syrup; Take 5 mLs by mouth 4 (four) times daily as needed.  Dispense: 118 mL; Refill: 0  2. Laryngitis - predniSONE  (DELTASONE ) 20 MG tablet; Take 2 tablets  (40 mg total) by mouth daily with breakfast.  Dispense: 10 tablet; Refill: 0  3. Antibiotic-induced yeast infection - fluconazole  (DIFLUCAN ) 150 MG tablet; Take 1 tablet (150 mg total) by mouth every 3 (three) days as needed.  Dispense: 2 tablet; Refill: 0  - Since symptoms worsened acutely overnight with now productive cough of thick, discolored mucus and chills will treat as bacterial bronchitis and laryngitis - Will treat with Z-pack and Promethazine  DM cough syrup - Can add Mucinex (PLAIN) during the daytime as needed - Prednisone  added for chest congestion and laryngitis (last random glucose normal at 90) - Push fluids.  - Rest.  - Steam and humidifier can help - Diflucan  given as prophylaxis as patient tends to get vaginal yeast infections with antibiotic use - Seek in person evaluation if worsening or symptoms fail to improve    Follow Up Instructions: I discussed the assessment and treatment plan with the patient. The patient was provided an opportunity to ask questions and all were answered. The patient agreed with the plan and demonstrated an understanding of the instructions.  A copy of instructions were sent to the patient via MyChart unless otherwise noted below.    The patient was advised to call back or seek an in-person evaluation if the symptoms worsen or if the condition fails to improve as anticipated.    Delon  CHRISTELLA Dickinson, PA-C     [1]  Allergies Allergen Reactions   Diuretic [B[place]-Hydran] Anaphylaxis   Rituxan  [Rituximab ] Anaphylaxis   Ace Inhibitors Swelling   Codeine Itching   Lisinopril  Swelling   Penicillins Hives and Itching   Seroquel [Quetiapine] Other (See Comments)    Hypotension, alt mental status   Latex Rash  [2]  Current Outpatient Medications:    azithromycin  (ZITHROMAX ) 250 MG tablet, Take 2 tablets on day 1, then 1 tablet daily on days 2 through 5, Disp: 6 tablet, Rfl: 0   fluconazole  (DIFLUCAN ) 150 MG tablet, Take 1  tablet (150 mg total) by mouth every 3 (three) days as needed., Disp: 2 tablet, Rfl: 0   predniSONE  (DELTASONE ) 20 MG tablet, Take 2 tablets (40 mg total) by mouth daily with breakfast., Disp: 10 tablet, Rfl: 0   promethazine -dextromethorphan (PROMETHAZINE -DM) 6.25-15 MG/5ML syrup, Take 5 mLs by mouth 4 (four) times daily as needed., Disp: 118 mL, Rfl: 0   acetaminophen  (TYLENOL ) 500 MG tablet, Take 1,000 mg by mouth as needed for mild pain., Disp: , Rfl:    atorvastatin  (LIPITOR) 20 MG tablet, TAKE 1 TABLET BY MOUTH EVERY DAY, Disp: 90 tablet, Rfl: 0   Berberine Chloride 500 MG CAPS, Take 500-1,000 mg by mouth daily., Disp: , Rfl:    bifidobacterium infantis (ALIGN) capsule, TAKE 1 CAPSULE BY MOUTH EVERY DAY, Disp: 28 capsule, Rfl: 1   carvedilol  (COREG ) 6.25 MG tablet, Take 2 tablets (12.5 mg total) by mouth 2 (two) times daily with a meal., Disp: 120 tablet, Rfl: 11   cetirizine  (ZYRTEC ) 10 MG tablet, Take 1 tablet (10 mg total) by mouth daily. (Patient taking differently: Take 10 mg by mouth as needed for allergies.), Disp: 30 tablet, Rfl: 0   chlorthalidone  (HYGROTON ) 25 MG tablet, Take 12.5 mg by mouth daily., Disp: , Rfl:    EPINEPHrine 0.3 mg/0.3 mL IJ SOAJ injection, Inject into the muscle., Disp: , Rfl:    ferrous sulfate  (FE TABS) 325 (65 FE) MG EC tablet, Take 1 tablet (325 mg total) by mouth daily with breakfast., Disp: 90 tablet, Rfl: 1   hydrOXYzine  (VISTARIL ) 25 MG capsule, Take 25 mg by mouth as needed for anxiety or itching., Disp: , Rfl:    lamoTRIgine  (LAMICTAL ) 200 MG tablet, TAKE 1 TABLET BY MOUTH TWICE A DAY, Disp: 180 tablet, Rfl: 0   linaclotide  (LINZESS ) 72 MCG capsule, TAKE 1 CAPSULE BY MOUTH DAILY BEFORE BREAKFAST. (Patient not taking: Reported on 05/26/2024), Disp: 90 capsule, Rfl: 1   losartan  (COZAAR ) 25 MG tablet, Take 1 tablet (25 mg total) by mouth daily., Disp: , Rfl:    meclizine  (ANTIVERT ) 25 MG tablet, Take 1 tablet (25 mg total) by mouth 3 (three) times daily as  needed for dizziness or nausea., Disp: 30 tablet, Rfl: 0   OIL OF [place] PO, Take 4 drops by mouth daily., Disp: , Rfl:    pantoprazole  (PROTONIX ) 40 MG tablet, Take 1 tablet (40 mg total) by mouth daily. TAKE 1 TABLET BY MOUTH TWICE A DAY BEFORE A MEAL, Disp: , Rfl:    potassium chloride  SA (KLOR-CON  M20) 20 MEQ tablet, Take 20 mEq by mouth once a week., Disp: , Rfl:    sertraline  (ZOLOFT ) 100 MG tablet, Take 1 tablet (100 mg total) by mouth 2 (two) times daily., Disp: 180 tablet, Rfl: 1  "

## 2024-10-20 ENCOUNTER — Encounter: Admitting: Family Medicine
